# Patient Record
Sex: Female | Born: 1960 | Race: Black or African American | Hispanic: No | Marital: Single | Smoking: Current every day smoker
Health system: Southern US, Community
[De-identification: ages and names within clinical notes are randomized; demographics above are authoritative.]

## PROBLEM LIST (undated history)

## (undated) DIAGNOSIS — J449 Chronic obstructive pulmonary disease, unspecified: Secondary | ICD-10-CM

## (undated) DIAGNOSIS — J45909 Unspecified asthma, uncomplicated: Secondary | ICD-10-CM

## (undated) DIAGNOSIS — I10 Essential (primary) hypertension: Secondary | ICD-10-CM

## (undated) DIAGNOSIS — Z952 Presence of prosthetic heart valve: Secondary | ICD-10-CM

## (undated) DIAGNOSIS — I2510 Atherosclerotic heart disease of native coronary artery without angina pectoris: Secondary | ICD-10-CM

## (undated) DIAGNOSIS — I509 Heart failure, unspecified: Secondary | ICD-10-CM

## (undated) DIAGNOSIS — I429 Cardiomyopathy, unspecified: Secondary | ICD-10-CM

## (undated) DIAGNOSIS — E785 Hyperlipidemia, unspecified: Secondary | ICD-10-CM

## (undated) DIAGNOSIS — I639 Cerebral infarction, unspecified: Secondary | ICD-10-CM

## (undated) DIAGNOSIS — I219 Acute myocardial infarction, unspecified: Secondary | ICD-10-CM

## (undated) DIAGNOSIS — N189 Chronic kidney disease, unspecified: Secondary | ICD-10-CM

## (undated) DIAGNOSIS — I480 Paroxysmal atrial fibrillation: Secondary | ICD-10-CM

## (undated) HISTORY — DX: Cerebral infarction, unspecified: I63.9

## (undated) HISTORY — DX: Paroxysmal atrial fibrillation: I48.0

## (undated) HISTORY — DX: Unspecified asthma, uncomplicated: J45.909

## (undated) HISTORY — DX: Acute myocardial infarction, unspecified: I21.9

## (undated) HISTORY — DX: Chronic obstructive pulmonary disease, unspecified: J44.9

## (undated) HISTORY — DX: Chronic kidney disease, unspecified: N18.9

## (undated) HISTORY — DX: Cardiomyopathy, unspecified: I42.9

## (undated) HISTORY — DX: Hyperlipidemia, unspecified: E78.5

## (undated) HISTORY — DX: Heart failure, unspecified: I50.9

## (undated) HISTORY — PX: CORONARY ANGIOPLASTY WITH STENT PLACEMENT: SHX49

## (undated) HISTORY — DX: Presence of prosthetic heart valve: Z95.2

## (undated) HISTORY — PX: CARDIAC SURGERY: SHX584

## (undated) HISTORY — DX: Essential (primary) hypertension: I10

## (undated) HISTORY — DX: Atherosclerotic heart disease of native coronary artery without angina pectoris: I25.10

## (undated) HISTORY — PX: MITRAL VALVE SURGERY: SHX714

---

## 2012-09-11 DIAGNOSIS — Z952 Presence of prosthetic heart valve: Secondary | ICD-10-CM

## 2012-09-11 HISTORY — DX: Presence of prosthetic heart valve: Z95.2

## 2015-07-01 DIAGNOSIS — M79641 Pain in right hand: Secondary | ICD-10-CM | POA: Insufficient documentation

## 2015-07-01 DIAGNOSIS — I6930 Unspecified sequelae of cerebral infarction: Secondary | ICD-10-CM | POA: Insufficient documentation

## 2017-05-03 DIAGNOSIS — F411 Generalized anxiety disorder: Secondary | ICD-10-CM | POA: Insufficient documentation

## 2017-05-03 DIAGNOSIS — I10 Essential (primary) hypertension: Secondary | ICD-10-CM | POA: Insufficient documentation

## 2017-08-23 DIAGNOSIS — G459 Transient cerebral ischemic attack, unspecified: Secondary | ICD-10-CM | POA: Insufficient documentation

## 2017-10-16 DIAGNOSIS — Z87898 Personal history of other specified conditions: Secondary | ICD-10-CM | POA: Insufficient documentation

## 2017-10-22 DIAGNOSIS — R9430 Abnormal result of cardiovascular function study, unspecified: Secondary | ICD-10-CM | POA: Insufficient documentation

## 2017-10-22 DIAGNOSIS — I480 Paroxysmal atrial fibrillation: Secondary | ICD-10-CM | POA: Insufficient documentation

## 2018-05-14 DIAGNOSIS — F323 Major depressive disorder, single episode, severe with psychotic features: Secondary | ICD-10-CM | POA: Insufficient documentation

## 2019-02-10 DIAGNOSIS — K922 Gastrointestinal hemorrhage, unspecified: Secondary | ICD-10-CM | POA: Insufficient documentation

## 2019-07-02 ENCOUNTER — Other Ambulatory Visit: Payer: Self-pay

## 2019-07-02 ENCOUNTER — Encounter: Payer: Self-pay | Admitting: Cardiology

## 2019-07-02 ENCOUNTER — Ambulatory Visit: Payer: Medicaid Other | Admitting: Cardiology

## 2019-07-02 VITALS — BP 111/57 | HR 93 | Temp 97.7°F | Resp 16 | Ht 60.0 in | Wt 211.0 lb

## 2019-07-02 DIAGNOSIS — Z7901 Long term (current) use of anticoagulants: Secondary | ICD-10-CM

## 2019-07-02 DIAGNOSIS — Z952 Presence of prosthetic heart valve: Secondary | ICD-10-CM

## 2019-07-02 DIAGNOSIS — E782 Mixed hyperlipidemia: Secondary | ICD-10-CM

## 2019-07-02 DIAGNOSIS — Z955 Presence of coronary angioplasty implant and graft: Secondary | ICD-10-CM

## 2019-07-02 DIAGNOSIS — Z6841 Body Mass Index (BMI) 40.0 and over, adult: Secondary | ICD-10-CM

## 2019-07-02 DIAGNOSIS — Z720 Tobacco use: Secondary | ICD-10-CM

## 2019-07-02 DIAGNOSIS — I2510 Atherosclerotic heart disease of native coronary artery without angina pectoris: Secondary | ICD-10-CM

## 2019-07-02 DIAGNOSIS — J449 Chronic obstructive pulmonary disease, unspecified: Secondary | ICD-10-CM

## 2019-07-02 DIAGNOSIS — I10 Essential (primary) hypertension: Secondary | ICD-10-CM

## 2019-07-02 DIAGNOSIS — Z8679 Personal history of other diseases of the circulatory system: Secondary | ICD-10-CM

## 2019-07-02 DIAGNOSIS — Z8673 Personal history of transient ischemic attack (TIA), and cerebral infarction without residual deficits: Secondary | ICD-10-CM

## 2019-07-02 DIAGNOSIS — Z792 Long term (current) use of antibiotics: Secondary | ICD-10-CM

## 2019-07-02 LAB — POCT INR: INR: 2.5 (ref 2.0–3.0)

## 2019-07-02 MED ORDER — METOPROLOL SUCCINATE ER 25 MG PO TB24: 25.0000 mg | ORAL_TABLET | Freq: Every day | ORAL | 5 refills | Status: DC

## 2019-07-02 NOTE — Progress Notes (Signed)
Date: 07/02/2019  ID:  Joanna Martinez, DOB 05-07-1960, MRN [id number]  PCP:  Benito Mccreedy, MD  Cardiologist:  Rex Kras, DO, [facility] (established care 07/02/2019) Former Cardiology Providers: Dr. Thayer Jew [phone]  REASON FOR CONSULT: Establish care, established history of coronary disease and status post mechanical mitral valve replacement.  REQUESTING PHYSICIAN:  Benito Mccreedy, MD [address] [place],   [id number]  Chief Complaint  Patient presents with  . Coronary Artery Disease    HPI  Joanna Martinez is a 59 y.o. female who is being seen today for the evaluation of coronary artery disease at the request of Osei-Bonsu, Iona Beard, MD. Patient's past medical history and cardiac risk factors include: History of atherosclerotic coronary artery disease with prior PCI performed on October 21, 2017 with DES to LCx and [facility] Jude mechanical mitral valve replacement in March 2002 at [facility] for rheumatic mitral stenosis, on oral anticoagulation, hypertension, tobacco smoker, several small strokes, paroxysmal atrial fibrillation (outside records from [facility]), HFrEF, ischemic cardiomyopathy, postmenopausal female, advanced age, obesity.  Patient is referred to the office for establishing cardiovascular care given her extensive cardiac history after moving to [place]. She denies chest pain or anginal discomfort; however, she has been out off her cardiac medications except coumadin for the last 6 to 12 months. Patient states when she was in [place] she would have her INR checked at [facility]. She did not have any records with her during the appointment and therefore, additional records were requested from Dr. Chriss Czar office prior to the completion of this note.   Based on prior records obtained patient has history of atherosclerotic coronary artery disease with prior PCI to LCX and SJM mechanical mitral valve replacement  in 05/2000 at [facility] for rheumatic mitral stenosis on long term [place] with coumadin. Last heart cath was in November 2019 due to angina which demonstrated scattered moderate non-obstructive disease with 65% stenosis of the ostial RPDA and medical therapy was recommended. Last echo in Feb 2020 noted LVEF of 40% per report.   FUNCTIONAL STATUS: No exercise or daily routine.    ALLERGIES: Not on File  MEDICATION LIST PRIOR TO VISIT: Current Meds  Medication Sig  . acetaminophen (TYLENOL) 650 MG CR tablet Take 2 tablets by mouth every 8 (eight) hours as needed.  Marland Kitchen albuterol (VENTOLIN HFA) 108 (90 Base) MCG/ACT inhaler Inhale into the lungs daily as needed.  Marland Kitchen allopurinol (ZYLOPRIM) 100 MG tablet Take 1 tablet by mouth daily.  Marland Kitchen atorvastatin (LIPITOR) 40 MG tablet Take 1 tablet by mouth daily.  . clopidogrel (PLAVIX) 75 MG tablet Take 1 tablet by mouth daily.  Marland Kitchen doxepin (SINEQUAN) 50 MG capsule Take 1 capsule by mouth daily.  . DULoxetine (CYMBALTA) 30 MG capsule Take 1 capsule by mouth daily.  . ferrous sulfate 325 (65 FE) MG EC tablet Take 1 tablet by mouth daily.  Marland Kitchen gabapentin (NEURONTIN) 400 MG capsule Take 1 capsule by mouth daily.  . metoprolol succinate (TOPROL-XL) 25 MG 24 hr tablet Take 1 tablet (25 mg total) by mouth daily. Hold if systolic blood pressure (top blood pressure number) less than 100 mmHg or heart rate less than 60 bpm (pulse).  . mirtazapine (REMERON) 30 MG tablet Take 1 tablet by mouth daily.  Marland Kitchen oxybutynin (DITROPAN) 5 MG tablet Take 1 tablet by mouth daily.  . QUEtiapine (SEROQUEL) 300 MG tablet Take 1 tablet by mouth daily.  Marland Kitchen warfarin (COUMADIN) 3 MG tablet Take by mouth.  As  Directed; Take 4.5 mg every Tues and Thurs and 3 mg all other days. Refer to most recent anticoagulation note for most updated directions.  . [DISCONTINUED] metoprolol succinate (TOPROL-XL) 25 MG 24 hr tablet Take 1 tablet by mouth daily.  . [DISCONTINUED] simvastatin (ZOCOR) 20 MG tablet Take 1 tablet  by mouth daily.     PAST MEDICAL HISTORY: Past Medical History:  Diagnosis Date  . Asthma   . CHF (congestive heart failure) ([place])   . Chronic kidney disease   . COPD (chronic obstructive pulmonary disease) ([place])   . Coronary artery disease   . H/O heart valve replacement with mechanical valve   . Heart attack ([place])   . Hyperlipidemia   . Hypertension   . Paroxysmal atrial fibrillation (HCC)   . Stroke [facility])     PAST SURGICAL HISTORY: Past Surgical History:  Procedure Laterality Date  . CARDIAC SURGERY    . CORONARY ANGIOPLASTY WITH STENT PLACEMENT    . MITRAL VALVE SURGERY      FAMILY HISTORY: The patient family history includes Diabetes in her brother, brother, and sister; HIV/AIDS in her father and mother; Hyperlipidemia in her brother, brother, and sister; Hypertension in her brother, brother, and sister.  SOCIAL HISTORY:  The patient  reports that she has been smoking. She has been smoking about 1.00 pack per day. She has never used smokeless tobacco. She reports current alcohol use. She reports previous drug use.  REVIEW OF SYSTEMS: Review of Systems  Constitution: Negative for chills and fever.  HENT: Negative for hoarse voice and nosebleeds.   Eyes: Negative for discharge, double vision and pain.  Cardiovascular: Negative for chest pain, claudication, dyspnea on exertion, leg swelling, near-syncope, orthopnea, palpitations, paroxysmal nocturnal dyspnea and syncope.  Respiratory: Negative for hemoptysis and shortness of breath.   Musculoskeletal: Negative for muscle cramps and myalgias.  Gastrointestinal: Negative for abdominal pain, constipation, diarrhea, hematemesis, hematochezia, melena, nausea and vomiting.  Neurological: Negative for dizziness and light-headedness.    PHYSICAL EXAM: Vitals with BMI 07/02/2019  Height 5' 0"   Weight 211 lbs  BMI 71.69  Systolic 678  Diastolic 57  Pulse 93    CONSTITUTIONAL: Well-developed and well-nourished. No acute  distress.  SKIN: Skin is warm and dry. No rash noted. No cyanosis. No pallor. No jaundice HEAD: Normocephalic and atraumatic.  EYES: No scleral icterus MOUTH/THROAT: Moist oral membranes.  NECK: No JVD present. No thyromegaly noted. No carotid bruits   LYMPHATIC: No visible cervical adenopathy.  CHEST Normal respiratory effort. No intercostal retractions  LUNGS: Clear to auscultation bilaterally. No stridor. No wheezes. No rales.  CARDIOVASCULAR: Regular rate and rhythm, positive L3-Y1, mechanical click heard at the apex, no murmurs rubs or gallops appreciated. ABDOMINAL: Obese, soft, nontender, nondistended, positive bowel sounds all 4 quadrants. No apparent ascites.  EXTREMITIES: No peripheral edema  HEMATOLOGIC: No significant bruising NEUROLOGIC: Oriented to person, place, and time. Nonfocal. Normal muscle tone.  PSYCHIATRIC: Normal mood and affect. Normal behavior. Cooperative   CARDIAC DATABASE: Status post SJM mechanical mitral valve placement in March 2002 at [facility] due to rheumatic mitral stenosis.  EKG: 07/02/2019: Sinus rhythm, 94 bpm, normal axis, incomplete right bundle branch block, left atrial enlargement, nonspecific T wave abnormality.   Echocardiogram: 04/13/2019 outside records: LVEF 01-75%, grade 1 diastolic impairment, moderately global hypokinesis.  Mechanical mitral valve present.  Valve function appears normal.  No evidence of mitral valve stenosis or regurgitation.  Mitral valve pressure halftime 38 ms.  Stress Testing: We  will obtain outside records.  Heart Catheterization: November 2019 per report: Proximal LAD 20%. LCx stent patent.  Discrete 20% narrowing noted distal to the stent.  A separate 30% stenosis was noted in the distal LCx. RCA: Diffuse 20-30% narrowing in the proximal/mid/distal segments. Posterior atrioventricular branch 50 to 60% stenosis in its distal segment prior to the origin of the most distal posterolateral branch. Right PDA  smooth tubular lesions with 60 to 70% luminal narrowing.    LABORATORY DATA: No flowsheet data found.  No flowsheet data found.  Lipid Panel  No results found for: CHOL, TRIG, HDL, CHOLHDL, VLDL, LDLCALC, LDLDIRECT, LABVLDL  No results found for: HGBA1C No components found for: NTPROBNP No results found for: TSH  BMP No results for input(s): NA, K, CL, CO2, GLUCOSE, BUN, CREATININE, CALCIUM, GFRNONAA, GFRAA in the last 8760 hours.  CBC No results for input(s): WBC, RBC, HGB, HCT, PLT, MCV, MCH, MCHC, RDW, LYMPHSABS, MONOABS, EOSABS, BASOSABS in the last 168 hours.  Invalid input(s): NEUTRABS  HEMOGLOBIN A1C No results found for: HGBA1C, MPG  Cardiac Panel (last 3 results) No results for input(s): CKTOTAL, CKMB, TROPONINI, RELINDX in the last 8760 hours. No results for input(s): TROPIPOC in the last 8760 hours.  BNP (last 3 results) No results for input(s): PROBNP in the last 8760 hours.  TSH No results for input(s): TSH in the last 8760 hours.  CHOLESTEROL No results for input(s): CHOL in the last 8760 hours.  Hepatic Function Panel No results for input(s): PROT, ALBUMIN, AST, ALT, ALKPHOS, BILITOT, BILIDIR, IBILI in the last 8760 hours.   External Labs: Collected: 06/10/2019 Creatinine 1.06 mg/dL. eGFR: 66 mL/min per 1.73 m Lipid profile: Total cholesterol 304, triglycerides 310, HDL 53, LDL 190 Hemoglobin A1c: 6.3 TSH: 1.26 and T4 0.68 (free)  IMPRESSION:    ICD-10-CM   1. Atherosclerosis of native coronary artery of native heart without angina pectoris  I25.10 EKG 12-Lead    metoprolol succinate (TOPROL-XL) 25 MG 24 hr tablet  2. History of coronary angioplasty with insertion of stent  Z95.5   3. Hx of mitral valve replacement with mechanical valve  Z95.2 PCV ECHOCARDIOGRAM COMPLETE  4. Long term (current) use of anticoagulants  Z79.01   5. Need for prophylactic antibiotic  Z79.2   6. History of stroke  Z86.73   7. Tobacco use  Z72.0   8. Benign  hypertension  I10   9. Mixed hyperlipidemia  E78.2   10. Hx of rheumatic fever  Z86.79   11. Chronic obstructive pulmonary disease, unspecified COPD type ([place])  J44.9   12. Class 3 severe obesity due to excess calories with serious comorbidity and body mass index (BMI) of 40.0 to 44.9 in adult [facility])  E66.01    Z68.41      RECOMMENDATIONS: Clemmie Buelna is a 59 y.o. female whose past medical history and cardiac risk factors include: History of atherosclerotic coronary artery disease with prior PCI performed on October 21, 2017 with DES to LCx and [place] mechanical mitral valve replacement in March 2002 at [facility] for rheumatic mitral stenosis, on oral anticoagulation, hypertension, tobacco smoker, several small strokes, paroxysmal atrial fibrillation (outside records from [facility]), HFrEF, ischemic cardiomyopathy, postmenopausal female, advanced age, obesity.  Atherosclerotic coronary artery disease with prior PCI:  EKG shows normal sinus rhythm without ischemia or injury pattern.   Echocardiogram will be ordered to evaluate for structural heart disease and left ventricular systolic function.  Continue Plavix.  We will start metoprolol  25 mg p.o. daily.  Outside records obtained and reviewed.  Atrial fibrillation, paroxysmal: . Rate control: Start metoprolol . Rhythm control: N/A . Thromboembolic prophylaxis: Currently on coumadin goal INR 2.5 to 3.5.  Marland Kitchen CHA2DS2-VASc SCORE is 6 which correlates to 9.8 % risk of stroke per year. . Patient does not endorse any evidence of bleeding.  Long--term oral anticoagulation:   Indication: Paroxysmal atrial fibrillation and mechanical mitral valve.  Currently on Coumadin.  Patient does not endorse any evidence of bleeding.  INR at today's office 2.5 continue current coumadin dose.   Status post [facility] Jude mechanical mitral valve secondary to rheumatic mitral valve stenosis in  March 2002:  Echocardiogram  will be ordered to evaluate for structural heart disease and left ventricular systolic function.  Continue coumadin w/ goal INR 2.5-3.5  Active tobacco smoker:   Educated on the importance of smoking cessation. Currently using nicotine patches.   Chronic HFrEF: Will continue to uptitrate medical therapy to maximally tolerated dose of GDMT.   Recommend daily weight check, strict I/O's  Fluid restriction to <2L per day, Na restriction < 2g per day  Hyperlipidemia:  Currently managed by primary team.  Based on the last office note reviewed from her PCPs office, patient was started on Lipitor 40 mg p.o. nightly.  Patient does not endorse any myalgias.   FINAL MEDICATION LIST END OF ENCOUNTER: Meds ordered this encounter  Medications  . metoprolol succinate (TOPROL-XL) 25 MG 24 hr tablet    Sig: Take 1 tablet (25 mg total) by mouth daily. Hold if systolic blood pressure (top blood pressure number) less than 100 mmHg or heart rate less than 60 bpm (pulse).    Dispense:  30 tablet    Refill:  5    Medications Discontinued During This Encounter  Medication Reason  . simvastatin (ZOCOR) 20 MG tablet Patient Preference  . metoprolol succinate (TOPROL-XL) 25 MG 24 hr tablet Reorder  . spironolactone (ALDACTONE) 25 MG tablet Patient Preference  . isosorbide mononitrate (IMDUR) 30 MG 24 hr tablet Patient Preference     Current Outpatient Medications:  .  acetaminophen (TYLENOL) 650 MG CR tablet, Take 2 tablets by mouth every 8 (eight) hours as needed., Disp: , Rfl:  .  albuterol (VENTOLIN HFA) 108 (90 Base) MCG/ACT inhaler, Inhale into the lungs daily as needed., Disp: , Rfl:  .  allopurinol (ZYLOPRIM) 100 MG tablet, Take 1 tablet by mouth daily., Disp: , Rfl:  .  atorvastatin (LIPITOR) 40 MG tablet, Take 1 tablet by mouth daily., Disp: , Rfl:  .  clopidogrel (PLAVIX) 75 MG tablet, Take 1 tablet by mouth daily., Disp: , Rfl:  .  doxepin (SINEQUAN) 50 MG capsule, Take 1 capsule by mouth  daily., Disp: , Rfl:  .  DULoxetine (CYMBALTA) 30 MG capsule, Take 1 capsule by mouth daily., Disp: , Rfl:  .  ferrous sulfate 325 (65 FE) MG EC tablet, Take 1 tablet by mouth daily., Disp: , Rfl:  .  gabapentin (NEURONTIN) 400 MG capsule, Take 1 capsule by mouth daily., Disp: , Rfl:  .  metoprolol succinate (TOPROL-XL) 25 MG 24 hr tablet, Take 1 tablet (25 mg total) by mouth daily. Hold if systolic blood pressure (top blood pressure number) less than 100 mmHg or heart rate less than 60 bpm (pulse)., Disp: 30 tablet, Rfl: 5 .  mirtazapine (REMERON) 30 MG tablet, Take 1 tablet by mouth daily., Disp: , Rfl:  .  oxybutynin (DITROPAN) 5 MG tablet, Take 1 tablet by  mouth daily., Disp: , Rfl:  .  QUEtiapine (SEROQUEL) 300 MG tablet, Take 1 tablet by mouth daily., Disp: , Rfl:  .  warfarin (COUMADIN) 3 MG tablet, Take by mouth. As  Directed; Take 4.5 mg every Tues and Thurs and 3 mg all other days. Refer to most recent anticoagulation note for most updated directions., Disp: , Rfl:   Orders Placed This Encounter  Procedures  . EKG 12-Lead  . PCV ECHOCARDIOGRAM COMPLETE    There are no Patient Instructions on file for this visit.  Total encounter time 60 minutes. *Total Encounter Time as defined by the Centers for Medicare and Medicaid Services includes, in addition to the face-to-face time of a patient visit (documented in the note above) non-face-to-face time: obtaining and reviewing outside history, ordering and reviewing medications, tests or procedures, care coordination (communications with other health care professionals or caregivers) and documentation in the medical record. Independently reviewed the records from Dr. Chriss Czar office and Dr. Waylan Rocher office and the associated results of echo, cath report, and labs.   --Continue cardiac medications as reconciled in final medication list. --Return in about 6 weeks (around 08/13/2019) for CAD and MVR follow up; needed prior records. . Or sooner  if needed. --Continue follow-up with your primary care physician regarding the management of your other chronic comorbid conditions.  Patient's questions and concerns were addressed to her satisfaction. She voices understanding of the instructions provided during this encounter.   This note was created using a voice recognition software as a result there may be grammatical errors inadvertently enclosed that do not reflect the nature of this encounter. Every attempt is made to correct such errors.  Rex Kras, [place], [facility]  Pager: [phone] Office: [phone]

## 2019-07-09 ENCOUNTER — Ambulatory Visit: Payer: Self-pay | Admitting: Cardiology

## 2019-07-10 ENCOUNTER — Other Ambulatory Visit: Payer: Self-pay

## 2019-07-10 ENCOUNTER — Ambulatory Visit: Payer: Medicaid Other

## 2019-07-10 ENCOUNTER — Ambulatory Visit: Payer: Medicaid Other | Admitting: Pharmacist

## 2019-07-10 DIAGNOSIS — Z952 Presence of prosthetic heart valve: Secondary | ICD-10-CM

## 2019-07-10 DIAGNOSIS — Z7901 Long term (current) use of anticoagulants: Secondary | ICD-10-CM

## 2019-07-10 DIAGNOSIS — I4891 Unspecified atrial fibrillation: Secondary | ICD-10-CM

## 2019-07-10 LAB — POCT INR: INR: 4 — AB (ref 2.0–3.0)

## 2019-07-10 NOTE — Progress Notes (Addendum)
Anticoagulation Management Joanna Martinez is a 59 y.o. female who reports to the clinic for monitoring of warfarin treatment.    Indication: s/p [facility] Jude mechanical mitral valve replacement in March 2002   Duration: indefinite Supervising physician: Tessa Lerner  Anticoagulation Clinic Visit History:  Patient does not report signs/symptoms of bleeding or thromboembolism. Hx of multiple previous strokes. Most recent incidence 3 or 4 years ago. Pt also on Plavix for history of coronary artery disease status post drug-eluting stent in August 2019. Noteded major DDI b/w warfarin and plavix of increased bleeding risk. Noted DDI b/w warfarin and allopurinol about increased anticoagulant effect as well. Pt denies any recent allopurinol dose changes.Pt confirmed to have a tan colored (3 mg) warfarin tablet at home. Pt normally takes warfarin in the mornings. Requested pt to start taking warfarin in the evening in order to efficiently adjust warfarin dose during subsequent INR visits.   Other recent changes: No changes in medications or lifestyle. Pt reports to normally eating one serving of collard greens weekly, but hasn't had any this week. Also has a serving of salad once-or twice a week. Reports to also drinking 2 servings of 8 oz of cranberry juice couple days ago that is unusual for the patient. Denies any alcohol intake.  Anticoagulation Episode Summary    Current INR goal:  2.5-3.5  TTR:  --  Next INR check:  07/16/2019  INR from last check:  4.0 (07/10/2019)  Weekly max warfarin dose:  24 mg  Target end date:  Indefinite  INR check location:    Preferred lab:    Send INR reminders to:     Indications   Long term (current) use of anticoagulants [Z79.01] S/P mitral valve replacement [Z95.2]       Comments:          Not on File  Current Outpatient Medications:  .  acetaminophen (TYLENOL) 650 MG CR tablet, Take 2 tablets by mouth every 8 (eight) hours as needed., Disp: , Rfl:  .   albuterol (VENTOLIN HFA) 108 (90 Base) MCG/ACT inhaler, Inhale into the lungs daily as needed., Disp: , Rfl:  .  allopurinol (ZYLOPRIM) 100 MG tablet, Take 1 tablet by mouth daily., Disp: , Rfl:  .  atorvastatin (LIPITOR) 40 MG tablet, Take 1 tablet by mouth daily., Disp: , Rfl:  .  clopidogrel (PLAVIX) 75 MG tablet, Take 1 tablet by mouth daily., Disp: , Rfl:  .  doxepin (SINEQUAN) 50 MG capsule, Take 1 capsule by mouth daily., Disp: , Rfl:  .  DULoxetine (CYMBALTA) 30 MG capsule, Take 1 capsule by mouth daily., Disp: , Rfl:  .  ferrous sulfate 325 (65 FE) MG EC tablet, Take 1 tablet by mouth daily., Disp: , Rfl:  .  gabapentin (NEURONTIN) 400 MG capsule, Take 1 capsule by mouth daily., Disp: , Rfl:  .  metoprolol succinate (TOPROL-XL) 25 MG 24 hr tablet, Take 1 tablet (25 mg total) by mouth daily. Hold if systolic blood pressure (top blood pressure number) less than 100 mmHg or heart rate less than 60 bpm (pulse)., Disp: 30 tablet, Rfl: 5 .  mirtazapine (REMERON) 30 MG tablet, Take 1 tablet by mouth daily., Disp: , Rfl:  .  oxybutynin (DITROPAN) 5 MG tablet, Take 1 tablet by mouth daily., Disp: , Rfl:  .  QUEtiapine (SEROQUEL) 300 MG tablet, Take 1 tablet by mouth daily., Disp: , Rfl:  .  warfarin (COUMADIN) 3 MG tablet, Take by mouth. As  directed, Disp: , Rfl:  Past Medical History:  Diagnosis Date  . Asthma   . CHF (congestive heart failure) ([place])   . Chronic kidney disease   . COPD (chronic obstructive pulmonary disease) ([place])   . Coronary artery disease   . H/O heart valve replacement with mechanical valve   . Heart attack ([place])   . Hyperlipidemia   . Hypertension   . Paroxysmal atrial fibrillation (HCC)   . Stroke [facility])     ASSESSMENT  Recent Results: The most recent result is correlated with 24 mg per week:  Lab Results  Component Value Date   INR 4.0 (A) 07/10/2019   INR 2.5 07/02/2019    Anticoagulation Dosing: Description   INR above goal. Take 1.5 mg today, and  then continue taking 4.5 mg every Tues and Sat and 3 mg all other days. Recheck INR in 6 days.       INR today: Supratherapeutic likely in the setting of decreased green intake and increased cranberry juice intake. Mixed report of possible interaction between cranberry products and warfarin resulting in increase in INR. Pt reports to have been previously therapeutic on the current dose. Pt reports to already have taken her warfarin dose prior to appt. Will have pt take reduce dose tomorrow and continue with previous maintenance dose. Pt planing on returning to her normal green intake and avoiding cranberry products in the future. Will continue close monitoring.   PLAN Weekly dose was unchanged. Take 1.5 mg tomorrow and continue current dose of 4.5 mg every Tues, Sat and 3 mg all other days.   Patient Instructions  INR above goal. Take 1.5 mg today, and then continue taking 4.5 mg every Tues and Sat and 3 mg all other days. Recheck INR in 6 days.   Patient advised to contact clinic or seek medical attention if signs/symptoms of bleeding or thromboembolism occur.  Patient verbalized understanding by repeating back information and was advised to contact me if further medication-related questions arise.   Follow-up Return in about 6 days (around 07/16/2019).  [name], PharmD  15 minutes spent face-to-face with the patient during the encounter. 50% of time spent on education, including signs/sx bleeding and clotting, as well as food and drug interactions with warfarin. 50% of time was spent on fingerprick POC INR sample collection,processing, results determination, and documentation

## 2019-07-10 NOTE — Patient Instructions (Signed)
INR above goal. Take 1.5 mg today, and then continue taking 4.5 mg every Tues and Sat and 3 mg all other days. Recheck INR in 6 days.

## 2019-07-16 ENCOUNTER — Other Ambulatory Visit: Payer: Self-pay

## 2019-07-16 ENCOUNTER — Ambulatory Visit: Payer: Medicaid Other | Admitting: Pharmacist

## 2019-07-16 DIAGNOSIS — Z7901 Long term (current) use of anticoagulants: Secondary | ICD-10-CM

## 2019-07-16 DIAGNOSIS — I4891 Unspecified atrial fibrillation: Secondary | ICD-10-CM

## 2019-07-16 DIAGNOSIS — Z952 Presence of prosthetic heart valve: Secondary | ICD-10-CM

## 2019-07-16 LAB — POCT INR: INR: 2.5 (ref 2.0–3.0)

## 2019-07-16 NOTE — Progress Notes (Signed)
Anticoagulation Management Joanna Martinez is a 59 y.o. female who reports to the clinic for monitoring of warfarin treatment.    Indication: s/p Saint Jude mechanical mitral valve replacement in March 2002   Duration: indefinite Supervising physician: Rex Kras  Anticoagulation Clinic Visit History:  Patient does not report signs/symptoms of bleeding or thromboembolism. Hx of multiple previous strokes. Most recent incidence 3 or 4 years ago. Pt also on Plavix for history of coronary artery disease status post drug-eluting stent in August 2019. Noteded major DDI b/w warfarin and plavix of increased bleeding risk. Noted DDI b/w warfarin and allopurinol about increased anticoagulant effect as well. Pt denies any recent allopurinol dose changes.Pt confirmed to have a tan colored (3 mg) warfarin tablet at home. Pt started taking warfarin in the evening as previously discussed.     Other recent changes: No changes in medications or lifestyle. Pt reports to normally eating one serving of collard greens weekly. Reports to have went back to her regular green intake since last visits. States that she stopped cranberry juice intake as previously discussed.   Anticoagulation Episode Summary    Current INR goal:  2.5-3.5  TTR:  --  Next INR check:  07/23/2019  INR from last check:  2.5 (07/16/2019)  Weekly max warfarin dose:  24 mg  Target end date:  Indefinite  INR check location:    Preferred lab:    Send INR reminders to:     Indications   Long term (current) use of anticoagulants [Z79.01] S/P mitral valve replacement [Z95.2] Atrial fibrillation ([place]) [I48.91]       Comments:          Not on File  Current Outpatient Medications:  .  acetaminophen (TYLENOL) 650 MG CR tablet, Take 2 tablets by mouth every 8 (eight) hours as needed., Disp: , Rfl:  .  albuterol (VENTOLIN HFA) 108 (90 Base) MCG/ACT inhaler, Inhale into the lungs daily as needed., Disp: , Rfl:  .  allopurinol (ZYLOPRIM) 100  MG tablet, Take 1 tablet by mouth daily., Disp: , Rfl:  .  atorvastatin (LIPITOR) 40 MG tablet, Take 1 tablet by mouth daily., Disp: , Rfl:  .  clopidogrel (PLAVIX) 75 MG tablet, Take 1 tablet by mouth daily., Disp: , Rfl:  .  doxepin (SINEQUAN) 50 MG capsule, Take 1 capsule by mouth daily., Disp: , Rfl:  .  DULoxetine (CYMBALTA) 30 MG capsule, Take 1 capsule by mouth daily., Disp: , Rfl:  .  ferrous sulfate 325 (65 FE) MG EC tablet, Take 1 tablet by mouth daily., Disp: , Rfl:  .  gabapentin (NEURONTIN) 400 MG capsule, Take 1 capsule by mouth daily., Disp: , Rfl:  .  metoprolol succinate (TOPROL-XL) 25 MG 24 hr tablet, Take 1 tablet (25 mg total) by mouth daily. Hold if systolic blood pressure (top blood pressure number) less than 100 mmHg or heart rate less than 60 bpm (pulse)., Disp: 30 tablet, Rfl: 5 .  mirtazapine (REMERON) 30 MG tablet, Take 1 tablet by mouth daily., Disp: , Rfl:  .  oxybutynin (DITROPAN) 5 MG tablet, Take 1 tablet by mouth daily., Disp: , Rfl:  .  QUEtiapine (SEROQUEL) 300 MG tablet, Take 1 tablet by mouth daily., Disp: , Rfl:  .  warfarin (COUMADIN) 3 MG tablet, Take by mouth. As  directed, Disp: , Rfl:  Past Medical History:  Diagnosis Date  . Asthma   . CHF (congestive heart failure) ([place])   . Chronic kidney disease   . COPD (  chronic obstructive pulmonary disease) ([facility])   . Coronary artery disease   . H/O heart valve replacement with mechanical valve   . Heart attack ([facility])   . Hyperlipidemia   . Hypertension   . Paroxysmal atrial fibrillation ([facility])   . Stroke [facility])     ASSESSMENT  Recent Results: The most recent result is correlated with 21 mg per week:  Lab Results  Component Value Date   INR 2.5 07/16/2019   INR 4.0 (A) 07/10/2019   INR 2.5 07/02/2019    Anticoagulation Dosing: Description   INR at goal. Continue taking 4.5 mg every Tues and Thurs and 3 mg all other days. Recheck INR in 1 week.       INR today: Therapeutic. Returns to within  range following returning to her regular green intake and avoiding possible interaction between warfarin and cranberry products. Drop in INR of 1.5 over the past week. Will continue close monitoring to ensure that INR remains within range. Pt high risk for stroke.   PLAN Weekly dose was unchanged. Continue current dose of 4.5 mg every Tues, Thurs and 3 mg all other days.   Patient Instructions  INR at goal. Continue taking 4.5 mg every Tues and Thurs and 3 mg all other days. Recheck INR in 1 week.   Patient advised to contact clinic or seek medical attention if signs/symptoms of bleeding or thromboembolism occur.  Patient verbalized understanding by repeating back information and was advised to contact me if further medication-related questions arise.   Follow-up Return in about 1 week (around 07/23/2019).  Leonides Schanz, PharmD  15 minutes spent face-to-face with the patient during the encounter. 50% of time spent on education, including signs/sx bleeding and clotting, as well as food and drug interactions with warfarin. 50% of time was spent on fingerprick POC INR sample collection,processing, results determination, and documentation

## 2019-07-16 NOTE — Patient Instructions (Signed)
INR at goal. Continue taking 4.5 mg every Tues and Thurs and 3 mg all other days. Recheck INR in 1 week.

## 2019-07-23 ENCOUNTER — Ambulatory Visit: Payer: Medicaid Other

## 2019-07-23 ENCOUNTER — Encounter: Payer: Self-pay | Admitting: Cardiology

## 2019-07-30 ENCOUNTER — Other Ambulatory Visit: Payer: Self-pay

## 2019-07-30 ENCOUNTER — Ambulatory Visit: Payer: Medicaid Other | Admitting: Pharmacist

## 2019-07-30 DIAGNOSIS — Z952 Presence of prosthetic heart valve: Secondary | ICD-10-CM

## 2019-07-30 DIAGNOSIS — I4891 Unspecified atrial fibrillation: Secondary | ICD-10-CM

## 2019-07-30 DIAGNOSIS — Z5181 Encounter for therapeutic drug level monitoring: Secondary | ICD-10-CM

## 2019-07-30 DIAGNOSIS — Z7901 Long term (current) use of anticoagulants: Secondary | ICD-10-CM

## 2019-07-30 LAB — POCT INR: INR: 2.6 (ref 2.0–3.0)

## 2019-07-30 NOTE — Progress Notes (Signed)
Anticoagulation Management Joanna Martinez is a 59 y.o. female who reports to the clinic for monitoring of warfarin treatment.    Indication: s/p Saint Jude mechanical mitral valve replacement in March 2002   Duration: indefinite Supervising physician: Rex Kras  Anticoagulation Clinic Visit History:  Patient does not report signs/symptoms of bleeding or thromboembolism. Hx of multiple previous strokes. Most recent incidence 3 or 4 years ago. Pt also on Plavix for history of coronary artery disease status post drug-eluting stent in August 2019. Noteded major DDI b/w warfarin and plavix of increased bleeding risk. Noted DDI b/w warfarin and allopurinol about increased anticoagulant effect as well. Pt denies any recent allopurinol dose changes.Pt confirmed to have a tan colored (3 mg) warfarin tablet at home. Pt started taking warfarin in the evening as previously discussed.     Other recent changes: No changes in medications or lifestyle.   Anticoagulation Episode Summary    Current INR goal:  2.5-3.5  TTR:  100.0 % (1.4 wk)  Next INR check:  08/13/2019  INR from last check:  2.6 (07/30/2019)  Weekly max warfarin dose:  24 mg  Target end date:  Indefinite  INR check location:    Preferred lab:    Send INR reminders to:     Indications   Long term (current) use of anticoagulants [Z79.01] S/P mitral valve replacement [Z95.2] Atrial fibrillation ([place]) [I48.91]       Comments:          Not on File  Current Outpatient Medications:  .  acetaminophen (TYLENOL) 650 MG CR tablet, Take 2 tablets by mouth every 8 (eight) hours as needed., Disp: , Rfl:  .  albuterol (VENTOLIN HFA) 108 (90 Base) MCG/ACT inhaler, Inhale into the lungs daily as needed., Disp: , Rfl:  .  allopurinol (ZYLOPRIM) 100 MG tablet, Take 1 tablet by mouth daily., Disp: , Rfl:  .  atorvastatin (LIPITOR) 40 MG tablet, Take 1 tablet by mouth daily., Disp: , Rfl:  .  clopidogrel (PLAVIX) 75 MG tablet, Take 1 tablet by  mouth daily., Disp: , Rfl:  .  doxepin (SINEQUAN) 50 MG capsule, Take 1 capsule by mouth daily., Disp: , Rfl:  .  DULoxetine (CYMBALTA) 30 MG capsule, Take 1 capsule by mouth daily., Disp: , Rfl:  .  ferrous sulfate 325 (65 FE) MG EC tablet, Take 1 tablet by mouth daily., Disp: , Rfl:  .  gabapentin (NEURONTIN) 400 MG capsule, Take 1 capsule by mouth daily., Disp: , Rfl:  .  metoprolol succinate (TOPROL-XL) 25 MG 24 hr tablet, Take 1 tablet (25 mg total) by mouth daily. Hold if systolic blood pressure (top blood pressure number) less than 100 mmHg or heart rate less than 60 bpm (pulse)., Disp: 30 tablet, Rfl: 5 .  mirtazapine (REMERON) 30 MG tablet, Take 1 tablet by mouth daily., Disp: , Rfl:  .  oxybutynin (DITROPAN) 5 MG tablet, Take 1 tablet by mouth daily., Disp: , Rfl:  .  QUEtiapine (SEROQUEL) 300 MG tablet, Take 1 tablet by mouth daily., Disp: , Rfl:  .  warfarin (COUMADIN) 3 MG tablet, Take by mouth. As  Directed; Take 4.5 mg every Tues and Thurs and 3 mg all other days. Refer to most recent anticoagulation note for most updated directions., Disp: , Rfl:  Past Medical History:  Diagnosis Date  . Asthma   . Cardiomyopathy ([place])   . CHF (congestive heart failure) ([place])   . Chronic kidney disease   . COPD (chronic obstructive pulmonary  disease) ([facility])   . Coronary artery disease   . H/O heart valve replacement with mechanical valve   . Heart attack ([facility])   . Hyperlipidemia   . Hypertension   . Paroxysmal atrial fibrillation ([facility])   . Stroke [facility])     ASSESSMENT  Recent Results: The most recent result is correlated with 24 mg per week:  Lab Results  Component Value Date   INR 2.6 07/30/2019   INR 2.5 07/16/2019   INR 4.0 (A) 07/10/2019    Anticoagulation Dosing: Description   INR at goal. Continue taking 4.5 mg every Tues and Thurs and 3 mg all other days. Recheck INR in 2 week w/ OV      INR today: Therapeutic. INR continues to remain therapeutic on current dose.  Denies any changes in medication, diet, or lifestyle since last check. Denies any bleeding or bruising symptoms that are unusual for her. Pt high risk for stroke. Will continue close monitoring. Pt has a f/u OV w/ Dr. Odis Hollingshead on 6/10.   PLAN Weekly dose was unchanged. Continue current dose of 4.5 mg every Tues, Thurs and 3 mg all other days. Recheck INR in 2 weeks w/ OV  Patient Instructions  INR at goal. Continue taking 4.5 mg every Tues and Thurs and 3 mg all other days. Recheck INR in 2 week w/ OV  Patient advised to contact clinic or seek medical attention if signs/symptoms of bleeding or thromboembolism occur.  Patient verbalized understanding by repeating back information and was advised to contact me if further medication-related questions arise.   Follow-up Return in about 2 weeks (around 08/13/2019).  Leonides Schanz, PharmD  15 minutes spent face-to-face with the patient during the encounter. 50% of time spent on education, including signs/sx bleeding and clotting, as well as food and drug interactions with warfarin. 50% of time was spent on fingerprick POC INR sample collection,processing, results determination, and documentation

## 2019-07-30 NOTE — Patient Instructions (Signed)
INR at goal. Continue taking 4.5 mg every Tues and Thurs and 3 mg all other days. Recheck INR in 2 week w/ OV

## 2019-08-04 ENCOUNTER — Telehealth: Payer: Self-pay

## 2019-08-04 NOTE — Telephone Encounter (Signed)
-----   Message from [place], [place] sent at 08/02/2019 12:36 PM EDT ----- Please make sure the patient keep her upcoming follow-up appointment to review her echocardiogram results.

## 2019-08-04 NOTE — Telephone Encounter (Signed)
Spoke with patient, patient voiced understanding.

## 2019-08-12 NOTE — Progress Notes (Signed)
Joanna Martinez Date of Birth: 05-04-1960 MRN: [id number] Primary Care Provider:Osei-Bonsu, Iona Beard, MD Former Cardiology Providers: Dr. Thayer Jew [phone] Primary Cardiologist: Rex Kras, DO, [facility] (established care 07/02/2019)   Date: 08/13/19 Last Office Visit: 07/02/2019  Chief Complaint  Patient presents with  . Coronary Artery Disease  . Results    Echocardiogram  . INR check  . Follow-up    6 week    HPI  Joanna Martinez is a 59 y.o. 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   Patient was originally referred to the office to establish care given her history of coronary artery disease and status post mechanical mitral valve replacement at the request of her primary care physician.  At last office visit patient was asymptomatic from a cardiovascular standpoint.  However, she noted that she was out of cardiac medications for approximately 6 to 12 months.  But was taking her Coumadin on a regular basis.  Prior to establishing care with our practice patient was to get an INR checked at  [facility].  Her last INR was checked approximately 3 weeks ago and it was 2.6.  Today's INR was 3.0.   Based on prior records obtained patient has history of atherosclerotic coronary artery disease with prior PCI to LCX and SJM mechanical mitral valve replacement in 05/2000 at [facility] for rheumatic mitral stenosis on  long term [place] with coumadin. Last heart cath was in November 2019 due to angina which demonstrated scattered moderate non-obstructive disease with 65% stenosis of the ostial RPDA and medical therapy was recommended.  At the last office visit, she was recommended to get an echocardiogram given her extensive cardiovascular history.  Echocardiogram performed in May 2021 noted moderately reduced left-ventricular systolic function at 30 to 35%, moderately dilated left atrium, functional mitral mechanical valve prosthesis.  The results were reviewed with the patient in great detail at today's visit and noted below for further reference.   Since last office visit her symptoms are quite stable.  She has not been seen at urgent care or ER for cardiovascular symptoms.  Patient is compliant with her medical therapy.  She has gained 2 pounds since last office visit.  And currently not in overt heart failure.  She was prescribed metoprolol at the last visit but has not started taking it.  FUNCTIONAL STATUS: No exercise or daily routine.    ALLERGIES: No Known Allergies  MEDICATION LIST PRIOR TO VISIT: Current Meds  Medication Sig  . acetaminophen (TYLENOL) 650 MG CR tablet Take 2 tablets by mouth every 8 (eight) hours as needed.  Marland Kitchen albuterol (VENTOLIN HFA) 108 (90 Base) MCG/ACT inhaler Inhale into the lungs daily as needed.  Marland Kitchen allopurinol (ZYLOPRIM) 100 MG tablet Take 1 tablet by mouth daily.  Marland Kitchen atorvastatin (LIPITOR) 40 MG tablet Take 1 tablet by mouth daily.  . clopidogrel (PLAVIX) 75 MG tablet Take 1 tablet by mouth daily.  Marland Kitchen doxepin (SINEQUAN) 50 MG capsule Take 1 capsule by mouth daily.  . DULoxetine (CYMBALTA) 30 MG capsule Take  1 capsule by mouth daily.  . ferrous sulfate 325 (65 FE) MG EC tablet Take 1 tablet by mouth daily.  Marland Kitchen gabapentin (NEURONTIN) 400 MG capsule Take 1 capsule by mouth daily.  . metoprolol succinate (TOPROL-XL) 25 MG 24 hr tablet Take 1 tablet (25 mg total) by mouth daily. Hold  if systolic blood pressure (top blood pressure number) less than 100 mmHg or heart rate less than 60 bpm (pulse).  . mirtazapine (REMERON) 30 MG tablet Take 1 tablet by mouth daily.  Marland Kitchen oxybutynin (DITROPAN) 5 MG tablet Take 1 tablet by mouth daily.  . QUEtiapine (SEROQUEL) 300 MG tablet Take 1 tablet by mouth daily.  Marland Kitchen warfarin (COUMADIN) 3 MG tablet Take by mouth. As  Directed; Take 4.5 mg every Tues and Thurs and 3 mg all other days. Refer to most recent anticoagulation note for most updated directions.     PAST MEDICAL HISTORY: Past Medical History:  Diagnosis Date  . Asthma   . Cardiomyopathy ([place])   . CHF (congestive heart failure) ([place])   . Chronic kidney disease   . COPD (chronic obstructive pulmonary disease) ([place])   . Coronary artery disease   . H/O heart valve replacement with mechanical valve   . Heart attack ([place])   . Hyperlipidemia   . Hypertension   . Paroxysmal atrial fibrillation (HCC)   . Stroke [facility])     PAST SURGICAL HISTORY: Past Surgical History:  Procedure Laterality Date  . CARDIAC SURGERY    . CORONARY ANGIOPLASTY WITH STENT PLACEMENT    . MITRAL VALVE SURGERY      FAMILY HISTORY: The patient family history includes Diabetes in her brother, brother, and sister; HIV/AIDS in her father and mother; Hyperlipidemia in her brother, brother, and sister; Hypertension in her brother, brother, and sister.  SOCIAL HISTORY:  The patient  reports that she has been smoking. She has been smoking about 1.00 pack per day. She has never used smokeless tobacco. She reports previous alcohol use. She reports that she does not use drugs.  REVIEW OF SYSTEMS: Review of Systems  Constitution: Negative for chills and fever.  HENT: Negative for hoarse voice and nosebleeds.   Eyes: Negative for discharge, double vision and pain.  Cardiovascular: Negative for chest pain, claudication, dyspnea on exertion, leg swelling, near-syncope, orthopnea, palpitations, paroxysmal nocturnal  dyspnea and syncope.  Respiratory: Negative for hemoptysis and shortness of breath.   Musculoskeletal: Negative for muscle cramps and myalgias.  Gastrointestinal: Negative for abdominal pain, constipation, diarrhea, hematemesis, hematochezia, melena, nausea and vomiting.  Neurological: Negative for dizziness and light-headedness.    PHYSICAL EXAM: Vitals with BMI 08/13/2019 07/02/2019  Height 5' 0"  5' 0"   Weight 213 lbs 6 oz 211 lbs  BMI 16.10 96.04  Systolic 540 981  Diastolic 69 57  Pulse 97 93    CONSTITUTIONAL: Well-developed and well-nourished. No acute distress.  SKIN: Skin is warm and dry. No rash noted. No cyanosis. No pallor. No jaundice HEAD: Normocephalic and atraumatic.  EYES: No scleral icterus MOUTH/THROAT: Moist oral membranes.  NECK: No JVD present. No thyromegaly noted. No carotid bruits   LYMPHATIC: No visible cervical adenopathy.  CHEST Normal respiratory effort. No intercostal retractions  LUNGS: Clear to auscultation bilaterally. No stridor. No wheezes. No rales.  CARDIOVASCULAR: Regular rate and rhythm, positive X9-J4, mechanical click heard at the apex, no murmurs rubs or gallops appreciated. ABDOMINAL: Obese, soft, nontender, nondistended, positive bowel sounds all 4 quadrants. No apparent ascites.  EXTREMITIES: No peripheral edema  HEMATOLOGIC:  No significant bruising NEUROLOGIC: Oriented to person, place, and time. Nonfocal. Normal muscle tone.  PSYCHIATRIC: Normal mood and affect. Normal behavior. Cooperative   CARDIAC DATABASE: Status post SJM mechanical mitral valve placement in March 2002 at [facility] due to rheumatic mitral stenosis.  EKG: 07/02/2019: Sinus rhythm, 94 bpm, normal axis, incomplete right bundle branch block, left atrial enlargement, nonspecific T wave abnormality.   Echocardiogram: 04/13/2019 outside records: LVEF 36-14%, grade 1 diastolic impairment, moderately global hypokinesis.  Mechanical mitral valve present.  Valve  function appears normal.  No evidence of mitral valve stenosis or regurgitation.  Mitral valve pressure halftime 38 ms.  07/10/2019: Moderately depressed LV systolic function with visual EF 30-35%. Left ventricle cavity is normal in size. Normal global wall motion. Indeterminate diastolic filling pattern. Calculated EF 35%. Left atrial cavity is moderately dilated at 4.5 cm. Mechanical mitral valve.  Trace mitral regurgitation, withing physiologic limits.  Normal mitral valve leaflet mobility. Peak PR of 4.6 and mean PG of 4 mm Hg. Trace tricuspid regurgitation. No evidence of pulmonary hypertension.   Stress Testing: We will obtain outside records.  Heart Catheterization: November 2019 per report: Proximal LAD 20%. LCx stent patent.  Discrete 20% narrowing noted distal to the stent.  A separate 30% stenosis was noted in the distal LCx. RCA: Diffuse 20-30% narrowing in the proximal/mid/distal segments. Posterior atrioventricular branch 50 to 60% stenosis in its distal segment prior to the origin of the most distal posterolateral branch. Right PDA smooth tubular lesions with 60 to 70% luminal narrowing.    LABORATORY DATA: No flowsheet data found.  No flowsheet data found.  Lipid Panel  No results found for: CHOL, TRIG, HDL, CHOLHDL, VLDL, LDLCALC, LDLDIRECT, LABVLDL  No results found for: HGBA1C No components found for: NTPROBNP No results found for: TSH  BMP No results for input(s): NA, K, CL, CO2, GLUCOSE, BUN, CREATININE, CALCIUM, GFRNONAA, GFRAA in the last 8760 hours.  CBC No results for input(s): WBC, RBC, HGB, HCT, PLT, MCV, MCH, MCHC, RDW, LYMPHSABS, MONOABS, EOSABS, BASOSABS in the last 168 hours.  Invalid input(s): NEUTRABS  HEMOGLOBIN A1C No results found for: HGBA1C, MPG  Cardiac Panel (last 3 results) No results for input(s): CKTOTAL, CKMB, TROPONINI, RELINDX in the last 8760 hours. No results for input(s): TROPIPOC in the last 8760 hours.  BNP (last 3  results) No results for input(s): PROBNP in the last 8760 hours.  TSH No results for input(s): TSH in the last 8760 hours.  CHOLESTEROL No results for input(s): CHOL in the last 8760 hours.  Hepatic Function Panel No results for input(s): PROT, ALBUMIN, AST, ALT, ALKPHOS, BILITOT, BILIDIR, IBILI in the last 8760 hours.   External Labs: Collected: 06/10/2019 Creatinine 1.06 mg/dL. eGFR: 66 mL/min per 1.73 m Lipid profile: Total cholesterol 304, triglycerides 310, HDL 53, LDL 190 Hemoglobin A1c: 6.3 TSH: 1.26 and T4 0.68 (free)  IMPRESSION:    ICD-10-CM   1. Chronic HFrEF (heart failure with reduced ejection fraction) (HCC)  I50.22 sacubitril-valsartan (ENTRESTO) 24-26 MG    Basic metabolic panel    Magnesium    Pro b natriuretic peptide (BNP)[facility] [facility])  2. Atherosclerosis of native coronary artery of native heart without angina pectoris  I25.10 POCT INR  3. History of coronary angioplasty with insertion of stent  Z95.5   4. Ischemic cardiomyopathy  I25.5   5. Atrial fibrillation, unspecified type ([place])  I48.91   6. Long term (current) use of anticoagulants  Z79.01   7. Hx of mitral  valve replacement with mechanical valve  Z95.2   8. Hx of rheumatic fever  Z86.79   9. Need for prophylactic antibiotic  Z79.2   10. History of stroke  Z86.73   11. Benign hypertension  I10   12. Mixed hyperlipidemia  E78.2   13. Tobacco use  Z72.0   14. Chronic obstructive pulmonary disease, unspecified COPD type ([place])  J44.9      RECOMMENDATIONS: Laporsha Grealish is a 59 y.o. 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  Atherosclerotic coronary artery disease with prior PCI without angina:  Echocardiogram results reviewed with the patient at today's visit.  In comparing the outside records there is a change in LVEF compared to February 2021.  This could be secondary to the fact that the patient ran out of her medications for 6-12 months prior to establishing care with myself.  Given the fact that she is asymptomatic would like to uptitrate her guideline directed medical therapy and to reevaluate her symptoms and EF.  Patient is agreeable with the plan of care.  Continue Plavix.  Start metoprolol 25 mg p.o. daily.  Chronic HFrEF: Will continue to uptitrate medical therapy to maximally tolerated dose of GDMT.   Start Entresto 24/26 mg p.o. twice daily.  Continue Toprol-XL.  Continue Plavix.  Continue statin therapy.  Check BMP, magnesium, and NT proBNP in 1 week after initiating Entresto.  Recommend daily weight check, strict I/O's  Fluid restriction to <2L per day, Na restriction < 2g per day  She will have repeat labs done around August 21, 2019 and I would like to see her in follow-up on June 25th 2021 to uptitrate heart failure medications.   Ischemic cardiomyopathy: See above  Atrial fibrillation, paroxysmal: . Rate control: Start metoprolol . Rhythm control: N/A . Thromboembolic prophylaxis: Currently on coumadin goal INR 2.5 to 3.5.  Marland Kitchen CHA2DS2-VASc SCORE is 6 which correlates to 9.8 % risk of stroke per year. . Patient does not endorse any evidence of bleeding.  Long--term oral anticoagulation:   Indication: Paroxysmal atrial fibrillation and mechanical mitral valve.  Currently on Coumadin.  Patient does not endorse any evidence of bleeding.  INR at today's office 3.0 continue current coumadin dose.   Continue with her current Coumadin dosing.  She  takes 4.5 mg Tuesdays and Thursdays and 3 mg all other days.  Status post [facility] Jude mechanical mitral valve secondary to rheumatic mitral valve stenosis in  March 2002:  Continue coumadin w/ goal INR 2.5-3.5  Active tobacco smoker:   Educated on the importance of smoking cessation. Currently using nicotine patches.   Hyperlipidemia:  Currently managed by primary team.  Continue Lipitor 40 mg p.o. nightly.  Patient does not endorse any myalgias.   FINAL MEDICATION LIST END OF ENCOUNTER: Meds ordered this encounter  Medications  . sacubitril-valsartan (ENTRESTO) 24-26 MG    Sig: Take 1 tablet by mouth 2 (two) times daily.    Dispense:  60 tablet    Refill:  0    There are no discontinued medications.   Current Outpatient Medications:  .  acetaminophen (TYLENOL) 650 MG CR tablet,  Take 2 tablets by mouth every 8 (eight) hours as needed., Disp: , Rfl:  .  albuterol (VENTOLIN HFA) 108 (90 Base) MCG/ACT inhaler, Inhale into the lungs daily as needed., Disp: , Rfl:  .  allopurinol (ZYLOPRIM) 100 MG tablet, Take 1 tablet by mouth daily., Disp: , Rfl:  .  atorvastatin (LIPITOR) 40 MG tablet, Take 1 tablet by mouth daily., Disp: , Rfl:  .  clopidogrel (PLAVIX) 75 MG tablet, Take 1 tablet by mouth daily., Disp: , Rfl:  .  doxepin (SINEQUAN) 50 MG capsule, Take 1 capsule by mouth daily., Disp: , Rfl:  .  DULoxetine (CYMBALTA) 30 MG capsule, Take 1 capsule by mouth daily., Disp: , Rfl:  .  ferrous sulfate 325 (65 FE) MG EC tablet, Take 1 tablet by mouth daily., Disp: , Rfl:  .  gabapentin (NEURONTIN) 400 MG capsule, Take 1 capsule by mouth daily., Disp: , Rfl:  .  metoprolol succinate (TOPROL-XL) 25 MG 24 hr tablet, Take 1 tablet (25 mg total) by mouth daily. Hold if systolic blood pressure (top blood pressure number) less than 100 mmHg or heart rate less than 60 bpm (pulse)., Disp: 30 tablet, Rfl: 5 .  mirtazapine (REMERON) 30 MG tablet, Take 1 tablet by mouth daily., Disp: , Rfl:  .   oxybutynin (DITROPAN) 5 MG tablet, Take 1 tablet by mouth daily., Disp: , Rfl:  .  QUEtiapine (SEROQUEL) 300 MG tablet, Take 1 tablet by mouth daily., Disp: , Rfl:  .  warfarin (COUMADIN) 3 MG tablet, Take by mouth. As  Directed; Take 4.5 mg every Tues and Thurs and 3 mg all other days. Refer to most recent anticoagulation note for most updated directions., Disp: , Rfl:  .  sacubitril-valsartan (ENTRESTO) 24-26 MG, Take 1 tablet by mouth 2 (two) times daily., Disp: 60 tablet, Rfl: 0  Orders Placed This Encounter  Procedures  . Basic metabolic panel  . Magnesium  . Pro b natriuretic peptide (BNP)[facility] [facility])  . POCT INR    There are no Patient Instructions on file for this visit.   --Continue cardiac medications as reconciled in final medication list. --Return in about 2 weeks (around 08/27/2019) for heart failure management.. Or sooner if needed. --Continue follow-up with your primary care physician regarding the management of your other chronic comorbid conditions.  Patient's questions and concerns were addressed to her satisfaction. She voices understanding of the instructions provided during this encounter.   This note was created using a voice recognition software as a result there may be grammatical errors inadvertently enclosed that do not reflect the nature of this encounter. Every attempt is made to correct such errors.  Rex Kras, [place], [facility]  Pager: [phone] Office: [phone]

## 2019-08-13 ENCOUNTER — Ambulatory Visit: Payer: Medicaid Other | Admitting: Cardiology

## 2019-08-13 ENCOUNTER — Encounter: Payer: Self-pay | Admitting: Cardiology

## 2019-08-13 ENCOUNTER — Telehealth: Payer: Self-pay

## 2019-08-13 ENCOUNTER — Other Ambulatory Visit: Payer: Self-pay

## 2019-08-13 VITALS — BP 114/69 | HR 97 | Resp 16 | Ht 60.0 in | Wt 213.4 lb

## 2019-08-13 DIAGNOSIS — Z8673 Personal history of transient ischemic attack (TIA), and cerebral infarction without residual deficits: Secondary | ICD-10-CM

## 2019-08-13 DIAGNOSIS — Z8679 Personal history of other diseases of the circulatory system: Secondary | ICD-10-CM

## 2019-08-13 DIAGNOSIS — Z952 Presence of prosthetic heart valve: Secondary | ICD-10-CM

## 2019-08-13 DIAGNOSIS — J449 Chronic obstructive pulmonary disease, unspecified: Secondary | ICD-10-CM

## 2019-08-13 DIAGNOSIS — I255 Ischemic cardiomyopathy: Secondary | ICD-10-CM

## 2019-08-13 DIAGNOSIS — I5022 Chronic systolic (congestive) heart failure: Secondary | ICD-10-CM

## 2019-08-13 DIAGNOSIS — I10 Essential (primary) hypertension: Secondary | ICD-10-CM

## 2019-08-13 DIAGNOSIS — I2510 Atherosclerotic heart disease of native coronary artery without angina pectoris: Secondary | ICD-10-CM

## 2019-08-13 DIAGNOSIS — E782 Mixed hyperlipidemia: Secondary | ICD-10-CM

## 2019-08-13 DIAGNOSIS — Z7901 Long term (current) use of anticoagulants: Secondary | ICD-10-CM

## 2019-08-13 DIAGNOSIS — I4891 Unspecified atrial fibrillation: Secondary | ICD-10-CM

## 2019-08-13 DIAGNOSIS — Z955 Presence of coronary angioplasty implant and graft: Secondary | ICD-10-CM

## 2019-08-13 DIAGNOSIS — Z792 Long term (current) use of antibiotics: Secondary | ICD-10-CM

## 2019-08-13 DIAGNOSIS — Z720 Tobacco use: Secondary | ICD-10-CM

## 2019-08-13 LAB — POCT INR: INR: 3 (ref 2.0–3.0)

## 2019-08-13 MED ORDER — ENTRESTO 24-26 MG PO TABS: 1.0000 | ORAL_TABLET | Freq: Two times a day (BID) | ORAL | 0 refills | Status: DC

## 2019-08-13 NOTE — Telephone Encounter (Signed)
Patient did not bring in any medications. Confirmed all medications verbally.

## 2019-08-17 ENCOUNTER — Telehealth: Payer: Self-pay

## 2019-08-17 NOTE — Telephone Encounter (Signed)
Patient called to let me know that she has not started the entresto because it needs a prior auth. She will start once med is approved. Sample was offered to her but she declined

## 2019-08-18 NOTE — Telephone Encounter (Signed)
Could we work on prior [place]?

## 2019-08-19 ENCOUNTER — Other Ambulatory Visit: Payer: Self-pay

## 2019-08-19 DIAGNOSIS — I5022 Chronic systolic (congestive) heart failure: Secondary | ICD-10-CM

## 2019-08-25 LAB — BASIC METABOLIC PANEL
BUN/Creatinine Ratio: 14 (ref 12–28)
BUN: 13 mg/dL (ref 8–27)
CO2: 18 mmol/L — ABNORMAL LOW (ref 20–29)
Calcium: 9.3 mg/dL (ref 8.7–10.3)
Chloride: 108 mmol/L — ABNORMAL HIGH (ref 96–106)
Creatinine, Ser: 0.96 mg/dL (ref 0.57–1.00)
GFR calc Af Amer: 74 mL/min/{1.73_m2} (ref >59)
GFR calc non Af Amer: 64 mL/min/{1.73_m2} (ref >59)
Glucose: 130 mg/dL — ABNORMAL HIGH (ref 65–99)
Potassium: 4.7 mmol/L (ref 3.5–5.2)
Sodium: 138 mmol/L (ref 134–144)

## 2019-08-25 LAB — PRO B NATRIURETIC PEPTIDE: NT-Pro BNP: 109 pg/mL (ref 0–287)

## 2019-08-25 LAB — MAGNESIUM: Magnesium: 1.8 mg/dL (ref 1.6–2.3)

## 2019-08-28 ENCOUNTER — Ambulatory Visit: Payer: Medicaid Other | Admitting: Pharmacist

## 2019-08-28 ENCOUNTER — Other Ambulatory Visit: Payer: Self-pay

## 2019-08-28 DIAGNOSIS — Z952 Presence of prosthetic heart valve: Secondary | ICD-10-CM

## 2019-08-28 DIAGNOSIS — Z5181 Encounter for therapeutic drug level monitoring: Secondary | ICD-10-CM

## 2019-08-28 DIAGNOSIS — I4891 Unspecified atrial fibrillation: Secondary | ICD-10-CM

## 2019-08-28 DIAGNOSIS — Z7901 Long term (current) use of anticoagulants: Secondary | ICD-10-CM

## 2019-08-28 LAB — POCT INR: INR: 3.8 — AB (ref 2.0–3.0)

## 2019-08-28 NOTE — Progress Notes (Signed)
Collad greens x1  Salad 2 servings TIW   Anticoagulation Management Joanna Martinez is a 59 y.o. female who reports to the clinic for monitoring of warfarin treatment.    Indication: s/p [place] mechanical mitral valve replacement in March 2002   Duration: indefinite Supervising physician: Tessa Lerner  Anticoagulation Clinic Visit History:  Patient does not report signs/symptoms of bleeding or thromboembolism. Hx of multiple previous strokes. Most recent incidence 3 or 4 years ago. Pt also on Plavix for history of coronary artery disease status post drug-eluting stent in August 2019. Noteded major DDI b/w warfarin and plavix of increased bleeding risk. Noted DDI b/w warfarin and allopurinol about increased anticoagulant effect as well. Pt denies any recent allopurinol dose changes.Pt confirmed to have a tan colored (3 mg) warfarin tablet at home. Pt started taking warfarin in the evening as previously discussed.     Other recent changes: No changes in medications or lifestyle. Pt normally has 1 serving of collard greens/week and 2 serving of salad TIW. Pt stated that she hadnt had her serving of collard greens this week. Planing on returning to normal VitK intake.     Anticoagulation Episode Summary    Current INR goal:  2.5-3.5  TTR:  85.7 % (1.3 mo)  Next INR check:  09/16/2019  INR from last check:  3.8 (08/28/2019)  Weekly max warfarin dose:  24 mg  Target end date:  Indefinite  INR check location:    Preferred lab:    Send INR reminders to:     Indications   S/P mitral valve replacement [Z95.2] Atrial fibrillation (HCC) [I48.91] Monitoring for long-term anticoagulant use [Z51.81 Z79.01]       Comments:          No Known Allergies  Current Outpatient Medications:  .  acetaminophen (TYLENOL) 650 MG CR tablet, Take 2 tablets by mouth every 8 (eight) hours as needed., Disp: , Rfl:  .  albuterol (VENTOLIN HFA) 108 (90 Base) MCG/ACT inhaler, Inhale into the lungs daily as  needed., Disp: , Rfl:  .  allopurinol (ZYLOPRIM) 100 MG tablet, Take 1 tablet by mouth daily., Disp: , Rfl:  .  atorvastatin (LIPITOR) 40 MG tablet, Take 1 tablet by mouth daily., Disp: , Rfl:  .  clopidogrel (PLAVIX) 75 MG tablet, Take 1 tablet by mouth daily., Disp: , Rfl:  .  doxepin (SINEQUAN) 50 MG capsule, Take 1 capsule by mouth daily., Disp: , Rfl:  .  DULoxetine (CYMBALTA) 30 MG capsule, Take 1 capsule by mouth daily., Disp: , Rfl:  .  ferrous sulfate 325 (65 FE) MG EC tablet, Take 1 tablet by mouth daily., Disp: , Rfl:  .  gabapentin (NEURONTIN) 400 MG capsule, Take 1 capsule by mouth daily., Disp: , Rfl:  .  metoprolol succinate (TOPROL-XL) 25 MG 24 hr tablet, Take 1 tablet (25 mg total) by mouth daily. Hold if systolic blood pressure (top blood pressure number) less than 100 mmHg or heart rate less than 60 bpm (pulse)., Disp: 30 tablet, Rfl: 5 .  mirtazapine (REMERON) 30 MG tablet, Take 1 tablet by mouth daily., Disp: , Rfl:  .  oxybutynin (DITROPAN) 5 MG tablet, Take 1 tablet by mouth daily., Disp: , Rfl:  .  QUEtiapine (SEROQUEL) 300 MG tablet, Take 1 tablet by mouth daily., Disp: , Rfl:  .  sacubitril-valsartan (ENTRESTO) 24-26 MG, Take 1 tablet by mouth 2 (two) times daily., Disp: 60 tablet, Rfl: 0 .  warfarin (COUMADIN) 3 MG tablet, Take by  mouth. As  Directed; Take 4.5 mg every Tues and Thurs and 3 mg all other days. Refer to most recent anticoagulation note for most updated directions., Disp: , Rfl:  Past Medical History:  Diagnosis Date  . Asthma   . Cardiomyopathy ([place])   . CHF (congestive heart failure) ([place])   . Chronic kidney disease   . COPD (chronic obstructive pulmonary disease) ([place])   . Coronary artery disease   . H/O heart valve replacement with mechanical valve   . Heart attack ([place])   . Hyperlipidemia   . Hypertension   . Paroxysmal atrial fibrillation (HCC)   . Stroke [facility])     ASSESSMENT  Recent Results: The most recent result is correlated with 24  mg per week:  Lab Results  Component Value Date   INR 3.8 (A) 08/28/2019   INR 3.0 08/13/2019   INR 2.6 07/30/2019    Anticoagulation Dosing: Description   INR above goal. Take 1.5 mg today and then Continue taking 4.5 mg every Tues and Thurs and 3 mg all other days. Recheck INR in 3 week w/ OV      INR today: Supratherapeutic. Likely in the setting of decreased Vit K intake. Pt has been previously stable on the current weekly dose. Pt planing on returning to consistent Vit K intake. Will dose adjust today and continue pt or previous maintenance dose. Denies any s/sx of bleeding or bruising.   PLAN Weekly dose was unchanged. Take 1.5 mg today and then continue current dose of 4.5 mg every Tues, Thurs and 3 mg all other days. Recheck INR in 3 weeks w/ OV  Patient Instructions  INR above goal. Take 1.5 mg today and then Continue taking 4.5 mg every Tues and Thurs and 3 mg all other days. Recheck INR in 3 week w/ OV  Patient advised to contact clinic or seek medical attention if signs/symptoms of bleeding or thromboembolism occur.  Patient verbalized understanding by repeating back information and was advised to contact me if further medication-related questions arise.   Follow-up Return in about 19 days (around 09/16/2019).  [name], PharmD  15 minutes spent face-to-face with the patient during the encounter. 50% of time spent on education, including signs/sx bleeding and clotting, as well as food and drug interactions with warfarin. 50% of time was spent on fingerprick POC INR sample collection,processing, results determination, and documentation

## 2019-08-28 NOTE — Patient Instructions (Signed)
INR above goal. Take 1.5 mg today and then Continue taking 4.5 mg every Tues and Thurs and 3 mg all other days. Recheck INR in 3 week w/ OV

## 2019-08-31 ENCOUNTER — Telehealth: Payer: Self-pay

## 2019-08-31 NOTE — Telephone Encounter (Signed)
Pt aware of blood work test results

## 2019-08-31 NOTE — Telephone Encounter (Signed)
-----   Message from [place], [place] sent at 08/30/2019 11:49 PM EDT ----- Results reviewed.Serum potassium and magnesium levels are within normal limits.Kidney function is relatively at baseline.Continue current medical therapy.Call if questions arise.

## 2019-09-16 ENCOUNTER — Ambulatory Visit: Payer: Medicaid Other | Admitting: Cardiology

## 2019-09-17 ENCOUNTER — Other Ambulatory Visit: Payer: Self-pay

## 2019-09-17 ENCOUNTER — Ambulatory Visit: Payer: Medicaid Other | Admitting: Cardiology

## 2019-09-17 ENCOUNTER — Other Ambulatory Visit: Payer: Self-pay | Admitting: Pharmacist

## 2019-09-17 ENCOUNTER — Encounter: Payer: Self-pay | Admitting: Cardiology

## 2019-09-17 VITALS — BP 115/70 | HR 54 | Ht 60.0 in | Wt 204.0 lb

## 2019-09-17 DIAGNOSIS — Z720 Tobacco use: Secondary | ICD-10-CM

## 2019-09-17 DIAGNOSIS — Z792 Long term (current) use of antibiotics: Secondary | ICD-10-CM

## 2019-09-17 DIAGNOSIS — Z5181 Encounter for therapeutic drug level monitoring: Secondary | ICD-10-CM

## 2019-09-17 DIAGNOSIS — Z8679 Personal history of other diseases of the circulatory system: Secondary | ICD-10-CM

## 2019-09-17 DIAGNOSIS — I10 Essential (primary) hypertension: Secondary | ICD-10-CM

## 2019-09-17 DIAGNOSIS — Z955 Presence of coronary angioplasty implant and graft: Secondary | ICD-10-CM

## 2019-09-17 DIAGNOSIS — Z8673 Personal history of transient ischemic attack (TIA), and cerebral infarction without residual deficits: Secondary | ICD-10-CM

## 2019-09-17 DIAGNOSIS — Z6839 Body mass index (BMI) 39.0-39.9, adult: Secondary | ICD-10-CM

## 2019-09-17 DIAGNOSIS — I5022 Chronic systolic (congestive) heart failure: Secondary | ICD-10-CM

## 2019-09-17 DIAGNOSIS — Z7901 Long term (current) use of anticoagulants: Secondary | ICD-10-CM

## 2019-09-17 DIAGNOSIS — E782 Mixed hyperlipidemia: Secondary | ICD-10-CM

## 2019-09-17 DIAGNOSIS — I480 Paroxysmal atrial fibrillation: Secondary | ICD-10-CM

## 2019-09-17 DIAGNOSIS — J449 Chronic obstructive pulmonary disease, unspecified: Secondary | ICD-10-CM

## 2019-09-17 DIAGNOSIS — I255 Ischemic cardiomyopathy: Secondary | ICD-10-CM

## 2019-09-17 DIAGNOSIS — I2510 Atherosclerotic heart disease of native coronary artery without angina pectoris: Secondary | ICD-10-CM

## 2019-09-17 DIAGNOSIS — Z952 Presence of prosthetic heart valve: Secondary | ICD-10-CM

## 2019-09-17 LAB — POCT INR: INR: 3.6 — AB (ref 2.0–3.0)

## 2019-09-17 MED ORDER — ENTRESTO 49-51 MG PO TABS: 1.0000 | ORAL_TABLET | Freq: Two times a day (BID) | ORAL | 0 refills | Status: DC

## 2019-09-17 MED ORDER — CLOPIDOGREL BISULFATE 75 MG PO TABS: 75.0000 mg | ORAL_TABLET | Freq: Every day | ORAL | 1 refills | Status: AC

## 2019-09-17 MED ORDER — ATORVASTATIN CALCIUM 40 MG PO TABS: 40.0000 mg | ORAL_TABLET | Freq: Every day | ORAL | 1 refills | Status: DC

## 2019-09-17 NOTE — Progress Notes (Signed)
Joanna Martinez Date of Birth: [birth year] MRN: [id number] Primary Care Provider:Osei-Bonsu, Iona Beard, MD Former Cardiology Providers: Dr. Thayer Jew [phone] Primary Cardiologist: Rex Kras, DO, [facility] (established care 07/02/2019)   Date: 09/17/19 Last Office Visit: 08/13/2019  Chief Complaint  Patient presents with  . Chronic Heart failure  . Follow-up    HPI  Joanna Martinez is a 59 y.o. 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   Patient was originally referred to the office to establish care given her history of coronary artery disease and status post mechanical mitral valve replacement at the request of her primary care physician.  During the initial visit patient was asymptomatic from a cardiovascular standpoint but noted that she had ran out of cardiac medications for approximately 6 to 12 months.  But was taking her Coumadin on a regular basis.  Based on prior records obtained patient has history of atherosclerotic coronary artery disease with prior PCI to LCX and SJM mechanical mitral valve replacement in 05/2000 at [facility] for rheumatic mitral stenosis on long term [place] with coumadin. Last heart cath was in November 2019 due to angina which demonstrated scattered moderate non-obstructive disease with 65% stenosis of the ostial RPDA and medical therapy was recommended.  Echocardiogram performed in May 2021 noted moderately  reduced left-ventricular systolic function at 30 to 35%, moderately dilated left atrium, functional mitral mechanical valve prosthesis.    Since last office visit her symptoms are quite stable.  She has not been seen at urgent care or ER for cardiovascular symptoms.  Patient is compliant with her medical therapy.  She has lost 9 pounds since last office visit.  And currently not in overt heart failure.  She was prescribed metoprolol and Entresto and is doing well with the medication changes. Her INR today is therapeutic.    FUNCTIONAL STATUS: No exercise or daily routine.    ALLERGIES: No Known Allergies  MEDICATION LIST PRIOR TO VISIT: Current Meds  Medication Sig  . acetaminophen (TYLENOL) 650 MG CR tablet Take 2 tablets by mouth every 8 (eight) hours as needed.  Marland Kitchen allopurinol (ZYLOPRIM) 100 MG tablet Take 1 tablet by mouth daily.  Marland Kitchen atorvastatin (LIPITOR) 40 MG tablet Take 1 tablet (40 mg total) by mouth at bedtime.  . clopidogrel (PLAVIX) 75 MG tablet Take 1 tablet (75 mg total) by mouth daily.  Marland Kitchen doxepin (SINEQUAN) 50 MG capsule Take 1 capsule by mouth daily.  . DULoxetine (CYMBALTA) 30 MG capsule Take 1 capsule by mouth daily.  . ferrous sulfate 325 (65 FE) MG EC tablet Take 1 tablet by mouth daily.  Marland Kitchen gabapentin (NEURONTIN) 400 MG capsule Take 1 capsule by mouth daily.  . metoprolol succinate (TOPROL-XL) 25 MG 24 hr tablet Take 1 tablet (25 mg total) by mouth daily. Hold if systolic blood pressure (top blood pressure number) less than 100 mmHg or heart rate less than 60 bpm (pulse).  . mirtazapine (REMERON) 30 MG tablet Take 1 tablet by mouth daily.  Marland Kitchen oxybutynin (DITROPAN) 5 MG tablet Take 1 tablet by  mouth daily.  . QUEtiapine (SEROQUEL) 300 MG tablet Take 1 tablet by mouth daily.  . sacubitril-valsartan (ENTRESTO) 49-51 MG Take 1 tablet by mouth 2 (two) times daily.  Marland Kitchen warfarin (COUMADIN) 3 MG tablet Take by mouth. As  Directed; Take 4.5 mg every Tues and Thurs and 3 mg all other  days. Refer to most recent anticoagulation note for most updated directions.     PAST MEDICAL HISTORY: Past Medical History:  Diagnosis Date  . Asthma   . Cardiomyopathy ([place])   . CHF (congestive heart failure) ([place])   . Chronic kidney disease   . COPD (chronic obstructive pulmonary disease) ([place])   . Coronary artery disease   . H/O heart valve replacement with mechanical valve   . Heart attack ([place])   . Hyperlipidemia   . Hypertension   . Paroxysmal atrial fibrillation (HCC)   . Stroke [facility])     PAST SURGICAL HISTORY: Past Surgical History:  Procedure Laterality Date  . CARDIAC SURGERY    . CORONARY ANGIOPLASTY WITH STENT PLACEMENT    . MITRAL VALVE SURGERY      FAMILY HISTORY: The patient family history includes Diabetes in her brother, brother, and sister; HIV/AIDS in her father and mother; Hyperlipidemia in her brother, brother, and sister; Hypertension in her brother, brother, and sister.  SOCIAL HISTORY:  The patient  reports that she has been smoking. She has been smoking about 1.00 pack per day. She has never used smokeless tobacco. She reports previous alcohol use. She reports that she does not use drugs.  REVIEW OF SYSTEMS: Review of Systems  Constitutional: Negative for chills and fever.  HENT: Negative for hoarse voice and nosebleeds.   Eyes: Negative for discharge, double vision and pain.  Cardiovascular: Negative for chest pain, claudication, dyspnea on exertion, leg swelling, near-syncope, orthopnea, palpitations, paroxysmal nocturnal dyspnea and syncope.  Respiratory: Negative for hemoptysis and shortness of breath.   Musculoskeletal: Negative for muscle cramps and myalgias.  Gastrointestinal: Negative for abdominal pain, constipation, diarrhea, hematemesis, hematochezia, melena, nausea and vomiting.  Neurological: Negative for dizziness and light-headedness.    PHYSICAL EXAM: Vitals with BMI 09/17/2019 08/13/2019 07/02/2019  Height 5' 0"  5' 0"  5' 0"     Weight 204 lbs 213 lbs 6 oz 211 lbs  BMI 39.84 94.70 96.28  Systolic 366 294 765  Diastolic 70 69 57  Pulse 54 97 93   CONSTITUTIONAL: Well-developed and well-nourished. No acute distress.  SKIN: Skin is warm and dry. No rash noted. No cyanosis. No pallor. No jaundice HEAD: Normocephalic and atraumatic.  EYES: No scleral icterus MOUTH/THROAT: Moist oral membranes.  NECK: No JVD present. No thyromegaly noted. No carotid bruits   LYMPHATIC: No visible cervical adenopathy.  CHEST Normal respiratory effort. No intercostal retractions  LUNGS: Clear to auscultation bilaterally. No stridor. No wheezes. No rales.  CARDIOVASCULAR: Regular rate and rhythm, positive Y6-T0, mechanical click heard at the apex, no murmurs rubs or gallops appreciated. ABDOMINAL: Obese, soft, nontender, nondistended, positive bowel sounds all 4 quadrants. No apparent ascites.  EXTREMITIES: No peripheral edema  HEMATOLOGIC: No significant bruising NEUROLOGIC: Oriented to person, place, and time. Nonfocal. Normal muscle tone.  PSYCHIATRIC: Normal mood and affect. Normal behavior. Cooperative   CARDIAC DATABASE: Status post SJM mechanical mitral valve placement in March 2002 at [facility] due to rheumatic mitral stenosis.  EKG: 07/02/2019: Sinus rhythm, 94 bpm, normal axis, incomplete right bundle branch block, left atrial enlargement, nonspecific T wave abnormality.   Echocardiogram: 04/13/2019 outside records: LVEF 40-45%,  grade 1 diastolic impairment, moderately global hypokinesis.  Mechanical mitral valve present.  Valve function appears normal.  No evidence of mitral valve stenosis or regurgitation.  Mitral valve pressure halftime 38 ms.  07/10/2019: Moderately depressed LV systolic function with visual EF 30-35%. Left ventricle cavity is normal in size. Normal global wall motion. Indeterminate diastolic filling pattern. Calculated EF 35%. Left atrial cavity is moderately dilated at 4.5 cm. Mechanical mitral  valve.  Trace mitral regurgitation, withing physiologic limits.  Normal mitral valve leaflet mobility. Peak PR of 4.6 and mean PG of 4 mm Hg. Trace tricuspid regurgitation. No evidence of pulmonary hypertension.   Stress Testing: We will obtain outside records.  Heart Catheterization: November 2019 per report: Proximal LAD 20%. LCx stent patent.  Discrete 20% narrowing noted distal to the stent.  A separate 30% stenosis was noted in the distal LCx. RCA: Diffuse 20-30% narrowing in the proximal/mid/distal segments. Posterior atrioventricular branch 50 to 60% stenosis in its distal segment prior to the origin of the most distal posterolateral branch. Right PDA smooth tubular lesions with 60 to 70% luminal narrowing.    LABORATORY DATA: No flowsheet data found.  CMP Latest Ref Rng & Units 08/24/2019  Glucose 65 - 99 mg/dL 130(H)  BUN 8 - 27 mg/dL 13  Creatinine 0.57 - 1.00 mg/dL 0.96  Sodium 134 - 144 mmol/L 138  Potassium 3.5 - 5.2 mmol/L 4.7  Chloride 96 - 106 mmol/L 108(H)  CO2 20 - 29 mmol/L 18(L)  Calcium 8.7 - 10.3 mg/dL 9.3    Lipid Panel  No results found for: CHOL, TRIG, HDL, CHOLHDL, VLDL, LDLCALC, LDLDIRECT, LABVLDL  No results found for: HGBA1C No components found for: NTPROBNP No results found for: TSH  BMP Recent Labs    08/24/19 1139  NA 138  K 4.7  CL 108*  CO2 18*  GLUCOSE 130*  BUN 13  CREATININE 0.96  CALCIUM 9.3  GFRNONAA 64  GFRAA 74    CBC No results for input(s): WBC, RBC, HGB, HCT, PLT, MCV, MCH, MCHC, RDW, LYMPHSABS, MONOABS, EOSABS, BASOSABS in the last 168 hours.  Invalid input(s): NEUTRABS  HEMOGLOBIN A1C No results found for: HGBA1C, MPG  Cardiac Panel (last 3 results) No results for input(s): CKTOTAL, CKMB, TROPONINI, RELINDX in the last 8760 hours. No results for input(s): TROPIPOC in the last 8760 hours.  BNP (last 3 results) Recent Labs    08/24/19 1139  PROBNP 109    TSH No results for input(s): TSH in the last 8760  hours.  CHOLESTEROL No results for input(s): CHOL in the last 8760 hours.  Hepatic Function Panel No results for input(s): PROT, ALBUMIN, AST, ALT, ALKPHOS, BILITOT, BILIDIR, IBILI in the last 8760 hours.   External Labs: Collected: 06/10/2019 Creatinine 1.06 mg/dL. eGFR: 66 mL/min per 1.73 m Lipid profile: Total cholesterol 304, triglycerides 310, HDL 53, LDL 190 Hemoglobin A1c: 6.3 TSH: 1.26 and T4 0.68 (free)  IMPRESSION:    ICD-10-CM   1. Chronic HFrEF (heart failure with reduced ejection fraction) (HCC)  I50.22 sacubitril-valsartan (ENTRESTO) 49-51 MG    Basic metabolic panel    Magnesium    Pro b natriuretic peptide (BNP)    Pro b natriuretic peptide (BNP)    Magnesium    Basic metabolic panel  2. Paroxysmal atrial fibrillation (HCC)  I48.0   3. Monitoring for long-term anticoagulant use  Z51.81    Z79.01   4. Long term (current) use of anticoagulants  Z79.01   5. Atherosclerosis of native  coronary artery of native heart without angina pectoris  I25.10   6. History of coronary angioplasty with insertion of stent  Z95.5   7. Ischemic cardiomyopathy  I25.5   8. Hx of mitral valve replacement with mechanical valve  Z95.2   9. Hx of rheumatic fever  Z86.79   10. Need for prophylactic antibiotic  Z79.2   11. History of stroke  Z86.73 clopidogrel (PLAVIX) 75 MG tablet  12. Benign hypertension  I10   13. Mixed hyperlipidemia  E78.2 atorvastatin (LIPITOR) 40 MG tablet  14. Tobacco use  Z72.0   15. Chronic obstructive pulmonary disease, unspecified COPD type (HCC)  J44.9   16. Class 2 severe obesity due to excess calories with serious comorbidity and body mass index (BMI) of 39.0 to 39.9 in adult [facility])  E66.01    Z68.39      RECOMMENDATIONS: Joanna Martinez is a 59 y.o. female whose past medical history and cardiac risk factors include: History of atherosclerotic coronary artery disease with prior PCI performed on October 21, 2017 with DES to LCx and [place] mechanical  mitral valve replacement in March 2002 at [facility] for rheumatic mitral stenosis, on oral anticoagulation, hypertension, tobacco smoker, several small strokes, paroxysmal atrial fibrillation (outside records from [facility]), HFrEF, ischemic cardiomyopathy, postmenopausal female, advanced age, obesity.  Atherosclerotic coronary artery disease with prior PCI without angina:  In comparing the outside records there is a change in LVEF compared to February 2021.  This could be secondary to the fact that the patient ran out of her medications for 6-12 months prior to establishing care with myself.  Given the fact that she is asymptomatic would like to uptitrate her guideline directed medical therapy and to reevaluate her symptoms and EF.  Patient is agreeable with the plan of care.  Continue Plavix, refilled.  Continue metoprolol 25 mg p.o. daily.  Chronic HFrEF:  Clinically patient is euvolemic.  Patient has lost approximately 9 pounds since last visit.  Entresto started 08/13/2019. Uptitrated Entresto 49/64m p.o. twice daily.  Prescription provided, recheck labs in 1 week  Continue Toprol-XL.  Continue Plavix.  Continue statin therapy.  Recommend daily weight check, strict I/O's  Fluid restriction to <2L per day, Na r K estriction < 2g per day  Ischemic cardiomyopathy: See above  Atrial fibrillation, paroxysmal: . Rate control: Start metoprolol . Rhythm control: N/A . Thromboembolic prophylaxis: Currently on coumadin goal INR 2.5 to 3.5.  .Marland KitchenCHA2DS2-VASc SCORE is 6 which correlates to 9.8 % risk of stroke per year. . Patient does not endorse any evidence of bleeding.  Long--term oral anticoagulation:   Indication: Paroxysmal atrial fibrillation and mechanical mitral valve.  Currently on Coumadin.  Patient does not endorse any evidence of bleeding.  INR at today's office 3.6 continue current coumadin dose.   Follow up INR check w/ coumadin clinic in  2 week.   [facility] to rheumatic mitral valve stenosis in  March 2002:  Continue coumadin w/ goal INR 2.5-3.5  Active tobacco smoker: Improving   Educated on the importance of smoking cessation. Has decreased the number of cigarette on daily basis.  Encouraged her to stop smoking by the next follow up visit.    Hyperlipidemia:  Currently managed by primary team.  Continue Lipitor 40 mg p.o. nightly.  Patient does not endorse any myalgias.   Patient requesting refill on Lipitor.  FINAL MEDICATION LIST END OF ENCOUNTER: Meds ordered this encounter  Medications  .  atorvastatin (LIPITOR) 40 MG tablet    Sig: Take 1 tablet (40 mg total) by mouth at bedtime.    Dispense:  90 tablet    Refill:  1  . clopidogrel (PLAVIX) 75 MG tablet    Sig: Take 1 tablet (75 mg total) by mouth daily.    Dispense:  90 tablet    Refill:  1  . sacubitril-valsartan (ENTRESTO) 49-51 MG    Sig: Take 1 tablet by mouth 2 (two) times daily.    Dispense:  60 tablet    Refill:  0    Medications Discontinued During This Encounter  Medication Reason  . albuterol (VENTOLIN HFA) 108 (90 Base) MCG/ACT inhaler Patient Preference  . atorvastatin (LIPITOR) 40 MG tablet Reorder  . clopidogrel (PLAVIX) 75 MG tablet Reorder     Current Outpatient Medications:  .  acetaminophen (TYLENOL) 650 MG CR tablet, Take 2 tablets by mouth every 8 (eight) hours as needed., Disp: , Rfl:  .  allopurinol (ZYLOPRIM) 100 MG tablet, Take 1 tablet by mouth daily., Disp: , Rfl:  .  atorvastatin (LIPITOR) 40 MG tablet, Take 1 tablet (40 mg total) by mouth at bedtime., Disp: 90 tablet, Rfl: 1 .  clopidogrel (PLAVIX) 75 MG tablet, Take 1 tablet (75 mg total) by mouth daily., Disp: 90 tablet, Rfl: 1 .  doxepin (SINEQUAN) 50 MG capsule, Take 1 capsule by mouth daily., Disp: , Rfl:  .  DULoxetine (CYMBALTA) 30 MG capsule, Take 1 capsule by mouth daily., Disp: , Rfl:  .  ferrous sulfate 325  (65 FE) MG EC tablet, Take 1 tablet by mouth daily., Disp: , Rfl:  .  gabapentin (NEURONTIN) 400 MG capsule, Take 1 capsule by mouth daily., Disp: , Rfl:  .  metoprolol succinate (TOPROL-XL) 25 MG 24 hr tablet, Take 1 tablet (25 mg total) by mouth daily. Hold if systolic blood pressure (top blood pressure number) less than 100 mmHg or heart rate less than 60 bpm (pulse)., Disp: 30 tablet, Rfl: 5 .  mirtazapine (REMERON) 30 MG tablet, Take 1 tablet by mouth daily., Disp: , Rfl:  .  oxybutynin (DITROPAN) 5 MG tablet, Take 1 tablet by mouth daily., Disp: , Rfl:  .  QUEtiapine (SEROQUEL) 300 MG tablet, Take 1 tablet by mouth daily., Disp: , Rfl:  .  sacubitril-valsartan (ENTRESTO) 49-51 MG, Take 1 tablet by mouth 2 (two) times daily., Disp: 60 tablet, Rfl: 0 .  warfarin (COUMADIN) 3 MG tablet, Take by mouth. As  Directed; Take 4.5 mg every Tues and Thurs and 3 mg all other days. Refer to most recent anticoagulation note for most updated directions., Disp: , Rfl:   Orders Placed This Encounter  Procedures  . Basic metabolic panel  . Magnesium  . Pro b natriuretic peptide (BNP)  . POCT INR    There are no Patient Instructions on file for this visit.   --Continue cardiac medications as reconciled in final medication list. --Return in about 4 weeks (around 10/15/2019) for heart failure management.. Or sooner if needed. --Continue follow-up with your primary care physician regarding the management of your other chronic comorbid conditions.  Patient's questions and concerns were addressed to her satisfaction. She voices understanding of the instructions provided during this encounter.   This note was created using a voice recognition software as a result there may be grammatical errors inadvertently enclosed that do not reflect the nature of this encounter. Every attempt is made to correct such errors.  Total Time Spent: 50 minute.  Rex Kras, [place], [facility]  Pager: [phone] Office:  [phone]

## 2019-09-20 MED ORDER — WARFARIN SODIUM 3 MG PO TABS: 3.0000 mg | ORAL_TABLET | Freq: Every day | ORAL | 2 refills | Status: DC

## 2019-10-16 ENCOUNTER — Encounter: Payer: Self-pay | Admitting: Cardiology

## 2019-10-16 ENCOUNTER — Ambulatory Visit: Payer: Medicaid Other | Admitting: Cardiology

## 2019-10-16 VITALS — BP 106/62 | HR 84 | Resp 15 | Ht 60.0 in | Wt 206.0 lb

## 2019-10-16 DIAGNOSIS — Z720 Tobacco use: Secondary | ICD-10-CM

## 2019-10-16 DIAGNOSIS — Z952 Presence of prosthetic heart valve: Secondary | ICD-10-CM

## 2019-10-16 DIAGNOSIS — I480 Paroxysmal atrial fibrillation: Secondary | ICD-10-CM

## 2019-10-16 DIAGNOSIS — Z8679 Personal history of other diseases of the circulatory system: Secondary | ICD-10-CM

## 2019-10-16 DIAGNOSIS — Z792 Long term (current) use of antibiotics: Secondary | ICD-10-CM

## 2019-10-16 DIAGNOSIS — Z8673 Personal history of transient ischemic attack (TIA), and cerebral infarction without residual deficits: Secondary | ICD-10-CM

## 2019-10-16 DIAGNOSIS — I10 Essential (primary) hypertension: Secondary | ICD-10-CM

## 2019-10-16 DIAGNOSIS — I5022 Chronic systolic (congestive) heart failure: Secondary | ICD-10-CM

## 2019-10-16 DIAGNOSIS — I255 Ischemic cardiomyopathy: Secondary | ICD-10-CM

## 2019-10-16 DIAGNOSIS — Z7901 Long term (current) use of anticoagulants: Secondary | ICD-10-CM

## 2019-10-16 DIAGNOSIS — E782 Mixed hyperlipidemia: Secondary | ICD-10-CM

## 2019-10-16 DIAGNOSIS — Z6841 Body Mass Index (BMI) 40.0 and over, adult: Secondary | ICD-10-CM

## 2019-10-16 DIAGNOSIS — Z955 Presence of coronary angioplasty implant and graft: Secondary | ICD-10-CM

## 2019-10-16 DIAGNOSIS — I2510 Atherosclerotic heart disease of native coronary artery without angina pectoris: Secondary | ICD-10-CM

## 2019-10-16 DIAGNOSIS — Z5181 Encounter for therapeutic drug level monitoring: Secondary | ICD-10-CM

## 2019-10-16 LAB — POCT INR: INR: 3.4 — AB (ref 2.0–3.0)

## 2019-10-16 NOTE — Progress Notes (Signed)
Joanna Martinez Date of Birth: Oct 07, 1960 MRN: [id number] Primary Care Provider:Osei-Bonsu, Iona Beard, MD Former Cardiology Providers: Dr. Thayer Jew [phone] Primary Cardiologist: Rex Kras, DO, [facility] (established care 07/02/2019)  Date: 10/16/19 Last Office Visit: 09/17/2019  Chief Complaint  Patient presents with  . Follow-up    4 week  . HFrEF    HPI  Joanna Martinez is a 59 y.o. 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   Patient was originally referred to the office to establish care given her history of coronary artery disease and status post mechanical mitral valve replacement at the request of her primary care physician.  During the initial visit patient was asymptomatic from a cardiovascular standpoint but noted that she had ran out of cardiac medications for approximately 6 to 12 months.  But was taking her Coumadin on a regular basis.  Based on prior records obtained patient has history of atherosclerotic coronary artery disease with prior PCI to LCX and SJM mechanical mitral valve replacement in 05/2000 at [facility] for rheumatic mitral stenosis on long term [place] with coumadin. Last heart cath was in November 2019 due to angina which demonstrated scattered moderate non-obstructive disease with 65% stenosis of the ostial RPDA and medical therapy was recommended.  Echocardiogram performed in May 2021 noted moderately  reduced left-ventricular systolic function at 30 to 35%, moderately dilated left atrium, functional mitral mechanical valve prosthesis.    Since last office visit her symptoms are quite stable.  She has not been seen at urgent care or ER for cardiovascular symptoms.  Patient is compliant with her medical therapy.  Since last visit patient has gained approximately 2 pounds.  She is not able to check her daily weights as per weight scale has appropriate.  I do not have the most recent weight scale from principal care management to review with her at today's visit. Clinically she is euvolemic and not in overt heart failure.  Patient was uptitrated on Entresto at last office visit but states that she forgot to get her recent blood work prior to today's office visit as she had a recent death in her family due to Covid infection.  Patient states that she will get the blood work done later today after the office visit.  Patient's INR is therapeutic at today's office visit and she does not endorse any evidence of bleeding.    FUNCTIONAL STATUS: No exercise or daily routine.    ALLERGIES: No Known Allergies  MEDICATION LIST PRIOR TO VISIT: Current Meds  Medication Sig  . acetaminophen (TYLENOL) 650 MG CR tablet Take 2 tablets by mouth every 8 (eight) hours as needed.  Marland Kitchen allopurinol (ZYLOPRIM) 100 MG tablet Take 1 tablet by mouth daily.  Marland Kitchen atorvastatin (LIPITOR) 40 MG tablet Take 1 tablet (40 mg total) by mouth at bedtime.  . clopidogrel (PLAVIX) 75 MG tablet Take 1 tablet (75 mg total) by mouth daily.  . ferrous sulfate 325 (65 FE) MG EC tablet Take 1 tablet by mouth daily.  Marland Kitchen gabapentin (NEURONTIN) 400 MG capsule Take  1 capsule by mouth daily.  . metoprolol succinate (TOPROL-XL) 25 MG 24 hr tablet Take 1 tablet (25 mg total) by mouth daily. Hold if systolic blood pressure (top blood pressure number) less than 100 mmHg or heart rate less than 60 bpm (pulse).  . mirtazapine (REMERON) 30 MG tablet Take 1  tablet by mouth daily.  Marland Kitchen oxybutynin (DITROPAN) 5 MG tablet Take 1 tablet by mouth daily.  . QUEtiapine (SEROQUEL) 300 MG tablet Take 1 tablet by mouth daily.  . sacubitril-valsartan (ENTRESTO) 49-51 MG Take 1 tablet by mouth 2 (two) times daily.  Marland Kitchen warfarin (COUMADIN) 3 MG tablet Take 1 tablet (3 mg total) by mouth daily. As  Directed; Take 4.5 mg every Tues and Thurs and 3 mg all other days. Refer to most recent anticoagulation note for most updated directions.     PAST MEDICAL HISTORY: Past Medical History:  Diagnosis Date  . Asthma   . Cardiomyopathy ([place])   . CHF (congestive heart failure) ([place])   . Chronic kidney disease   . COPD (chronic obstructive pulmonary disease) ([place])   . Coronary artery disease   . H/O heart valve replacement with mechanical valve   . Heart attack ([place])   . Hyperlipidemia   . Hypertension   . Paroxysmal atrial fibrillation (HCC)   . Stroke [facility])     PAST SURGICAL HISTORY: Past Surgical History:  Procedure Laterality Date  . CARDIAC SURGERY    . CORONARY ANGIOPLASTY WITH STENT PLACEMENT    . MITRAL VALVE SURGERY      FAMILY HISTORY: The patient family history includes Diabetes in her brother, brother, and sister; HIV/AIDS in her father and mother; Hyperlipidemia in her brother, brother, and sister; Hypertension in her brother, brother, and sister.  SOCIAL HISTORY:  The patient  reports that she has been smoking. She has been smoking about 1.00 pack per day. She has never used smokeless tobacco. She reports previous alcohol use. She reports that she does not use drugs.  REVIEW OF SYSTEMS: Review of Systems  Constitutional: Negative for chills and fever.  HENT: Negative for hoarse voice and nosebleeds.   Eyes: Negative for discharge, double vision and pain.  Cardiovascular: Negative for chest pain, claudication, dyspnea on exertion, leg swelling, near-syncope, orthopnea, palpitations, paroxysmal nocturnal dyspnea and syncope.  Respiratory:  Negative for hemoptysis and shortness of breath.   Musculoskeletal: Negative for muscle cramps and myalgias.  Gastrointestinal: Negative for abdominal pain, constipation, diarrhea, hematemesis, hematochezia, melena, nausea and vomiting.  Neurological: Negative for dizziness and light-headedness.    PHYSICAL EXAM: Vitals with BMI 10/16/2019 09/17/2019 08/13/2019  Height 5' 0"  5' 0"  5' 0"   Weight 206 lbs 204 lbs 213 lbs 6 oz  BMI 40.23 16.10 96.04  Systolic 540 981 191  Diastolic 62 70 69  Pulse 84 54 97   CONSTITUTIONAL: Well-developed and well-nourished. No acute distress.  SKIN: Skin is warm and dry. No rash noted. No cyanosis. No pallor. No jaundice HEAD: Normocephalic and atraumatic.  EYES: No scleral icterus MOUTH/THROAT: Moist oral membranes.  NECK: No JVD present. No thyromegaly noted. No carotid bruits   LYMPHATIC: No visible cervical adenopathy.  CHEST Normal respiratory effort. No intercostal retractions  LUNGS: Clear to auscultation bilaterally. No stridor. No wheezes. No rales.  CARDIOVASCULAR: Regular rate and rhythm, positive Y7-W2, mechanical click heard at the apex, no murmurs rubs or gallops appreciated. ABDOMINAL: Obese, soft, nontender, nondistended, positive bowel sounds all 4 quadrants. No apparent ascites.  EXTREMITIES: No peripheral edema  HEMATOLOGIC:  No significant bruising NEUROLOGIC: Oriented to person, place, and time. Nonfocal. Normal muscle tone.  PSYCHIATRIC: Normal mood and affect. Normal behavior. Cooperative   CARDIAC DATABASE: Status post SJM mechanical mitral valve placement in March 2002 at [facility] due to rheumatic mitral stenosis.  EKG: 07/02/2019: Sinus rhythm, 94 bpm, normal axis, incomplete right bundle branch block, left atrial enlargement, nonspecific T wave abnormality.   Echocardiogram: 04/13/2019 outside records: LVEF 02-77%, grade 1 diastolic impairment, moderately global hypokinesis.  Mechanical mitral valve present.  Valve  function appears normal.  No evidence of mitral valve stenosis or regurgitation.  Mitral valve pressure halftime 38 ms.  07/10/2019: Moderately depressed LV systolic function with visual EF 30-35%. Left ventricle cavity is normal in size. Normal global wall motion. Indeterminate diastolic filling pattern. Calculated EF 35%. Left atrial cavity is moderately dilated at 4.5 cm. Mechanical mitral valve.  Trace mitral regurgitation, withing physiologic limits.  Normal mitral valve leaflet mobility. Peak PR of 4.6 and mean PG of 4 mm Hg. Trace tricuspid regurgitation. No evidence of pulmonary hypertension.   Stress Testing: We will obtain outside records.  Heart Catheterization: November 2019 per report: Proximal LAD 20%. LCx stent patent.  Discrete 20% narrowing noted distal to the stent.  A separate 30% stenosis was noted in the distal LCx. RCA: Diffuse 20-30% narrowing in the proximal/mid/distal segments. Posterior atrioventricular branch 50 to 60% stenosis in its distal segment prior to the origin of the most distal posterolateral branch. Right PDA smooth tubular lesions with 60 to 70% luminal narrowing.    LABORATORY DATA: No flowsheet data found.  CMP Latest Ref Rng & Units 08/24/2019  Glucose 65 - 99 mg/dL 130(H)  BUN 8 - 27 mg/dL 13  Creatinine 0.57 - 1.00 mg/dL 0.96  Sodium 134 - 144 mmol/L 138  Potassium 3.5 - 5.2 mmol/L 4.7  Chloride 96 - 106 mmol/L 108(H)  CO2 20 - 29 mmol/L 18(L)  Calcium 8.7 - 10.3 mg/dL 9.3    Lipid Panel  No results found for: CHOL, TRIG, HDL, CHOLHDL, VLDL, LDLCALC, LDLDIRECT, LABVLDL  No results found for: HGBA1C No components found for: NTPROBNP No results found for: TSH  BMP Recent Labs    08/24/19 1139  NA 138  K 4.7  CL 108*  CO2 18*  GLUCOSE 130*  BUN 13  CREATININE 0.96  CALCIUM 9.3  GFRNONAA 64  GFRAA 74    CBC No results for input(s): WBC, RBC, HGB, HCT, PLT, MCV, MCH, MCHC, RDW, LYMPHSABS, MONOABS, EOSABS, BASOSABS in the last  168 hours.  Invalid input(s): NEUTRABS  HEMOGLOBIN A1C No results found for: HGBA1C, MPG  Cardiac Panel (last 3 results) No results for input(s): CKTOTAL, CKMB, TROPONINI, RELINDX in the last 8760 hours. No results for input(s): TROPIPOC in the last 8760 hours.  BNP (last 3 results) Recent Labs    08/24/19 1139  PROBNP 109    TSH No results for input(s): TSH in the last 8760 hours.  CHOLESTEROL No results for input(s): CHOL in the last 8760 hours.  Hepatic Function Panel No results for input(s): PROT, ALBUMIN, AST, ALT, ALKPHOS, BILITOT, BILIDIR, IBILI in the last 8760 hours.   External Labs: Collected: 06/10/2019 Creatinine 1.06 mg/dL. eGFR: 66 mL/min per 1.73 m Lipid profile: Total cholesterol 304, triglycerides 310, HDL 53, LDL 190 Hemoglobin A1c: 6.3 TSH: 1.26 and T4 0.68 (free)  IMPRESSION:    ICD-10-CM   1. Chronic HFrEF (heart failure with reduced ejection fraction) (HCC)  I50.22   2. Paroxysmal atrial  fibrillation ([place])  I48.0   3. Long term (current) use of anticoagulants  Z79.01   4. Monitoring for long-term anticoagulant use  Z51.81 POCT INR   Z79.01   5. Atherosclerosis of native coronary artery of native heart without angina pectoris  I25.10   6. History of coronary angioplasty with insertion of stent  Z95.5   7. Ischemic cardiomyopathy  I25.5   8. Hx of mitral valve replacement with mechanical valve  Z95.2   9. Hx of rheumatic fever  Z86.79   10. Need for prophylactic antibiotic  Z79.2   11. History of stroke  Z86.73   12. Benign hypertension  I10   13. Mixed hyperlipidemia  E78.2   14. Tobacco use  Z72.0   15. Class 3 severe obesity due to excess calories with serious comorbidity and body mass index (BMI) of 40.0 to 44.9 in adult [facility])  E66.01    Z68.41      RECOMMENDATIONS: Ayasha Ellingsen is a 59 y.o. 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  Atherosclerotic coronary artery disease with prior PCI without angina:  In comparing the outside records there is a change in LVEF compared to February 2021.  This could be secondary to the fact that the patient ran out of her medications for 6-12 months prior to establishing care with myself.  Given the fact that she is asymptomatic would like to uptitrate her guideline directed medical therapy and to reevaluate her symptoms and EF.  Patient is agreeable with the plan of care.  Continue Plavix.  Continue metoprolol 25 mg p.o. daily.  Chronic HFrEF:  Clinically patient is euvolemic.  Patient has gained approximately 2 pounds since last visit.  Entresto started 08/13/2019. Uptitrated Entresto 49/77m p.o. twice daily  She did not check her labs after going up on Entresto. She will check it today. If Cr is stable will add Aldactone  Continue Toprol-XL.  Continue Plavix.  Continue statin therapy.  Recommend daily weight check, strict I/O's  Fluid restriction to <2L per day, Na r K estriction < 2g per day  Currently in principle care management for HF management; however, weight scale stop working. We will have it exchanged for her.   Ischemic cardiomyopathy: See above  Atrial fibrillation, paroxysmal: . Rate control: Start metoprolol . Rhythm control: N/A . Thromboembolic prophylaxis: Currently on coumadin goal INR 2.5 to 3.5. Today's INR is 3.4.  . CHA2DS2-VASc SCORE is 6 which correlates to 9.8 % risk of stroke per year. . Patient does not endorse any evidence of bleeding.  Long--term oral  anticoagulation:   Indication: Paroxysmal atrial fibrillation and mechanical mitral valve.  Currently on Coumadin.  Patient does not endorse any evidence of bleeding.  INR at today's office 3.4 continue current coumadin dose.  Follow-up in 1 month for INR check.  [facility] to rheumatic mitral valve stenosis in  March 2002:  Continue coumadin w/ goal INR 2.5-3.5  Active tobacco smoker: Improving   Educated on the importance of smoking cessation. Currently 10 cigarette per day.  Encouraged her to stop smoking by the next  follow up visit.    Hyperlipidemia:  Currently managed by primary team.  Continue Lipitor 40 mg p.o. nightly.  Patient does not endorse any myalgias.  FINAL MEDICATION LIST END OF ENCOUNTER: No orders of the defined types were placed in this encounter.    Current Outpatient Medications:  .  acetaminophen (TYLENOL) 650 MG CR tablet, Take 2 tablets by mouth every 8 (eight) hours as needed., Disp: , Rfl:  .  allopurinol (ZYLOPRIM) 100 MG tablet, Take 1 tablet by mouth daily., Disp: , Rfl:  .  atorvastatin (LIPITOR) 40 MG tablet, Take 1 tablet (40 mg total) by mouth at bedtime., Disp: 90 tablet, Rfl: 1 .  clopidogrel (PLAVIX) 75 MG tablet, Take 1 tablet (75 mg total) by mouth daily., Disp: 90 tablet, Rfl: 1 .  ferrous sulfate 325 (65 FE) MG EC tablet, Take 1 tablet by mouth daily., Disp: , Rfl:  .  gabapentin (NEURONTIN) 400 MG capsule, Take 1 capsule by mouth daily., Disp: , Rfl:  .  metoprolol succinate (TOPROL-XL) 25 MG 24 hr tablet, Take 1 tablet (25 mg total) by mouth daily. Hold if systolic blood pressure (top blood pressure number) less than 100 mmHg or heart rate less than 60 bpm (pulse)., Disp: 30 tablet, Rfl: 5 .  mirtazapine (REMERON) 30 MG tablet, Take 1 tablet by mouth daily., Disp: , Rfl:  .  oxybutynin (DITROPAN) 5 MG tablet, Take 1 tablet by mouth daily., Disp: , Rfl:  .  QUEtiapine (SEROQUEL) 300 MG  tablet, Take 1 tablet by mouth daily., Disp: , Rfl:  .  sacubitril-valsartan (ENTRESTO) 49-51 MG, Take 1 tablet by mouth 2 (two) times daily., Disp: 60 tablet, Rfl: 0 .  warfarin (COUMADIN) 3 MG tablet, Take 1 tablet (3 mg total) by mouth daily. As  Directed; Take 4.5 mg every Tues and Thurs and 3 mg all other days. Refer to most recent anticoagulation note for most updated directions., Disp: 100 tablet, Rfl: 2 .  DULoxetine (CYMBALTA) 30 MG capsule, Take 1 capsule by mouth daily. (Patient not taking: Reported on 10/16/2019), Disp: , Rfl:   Orders Placed This Encounter  Procedures  . POCT INR    There are no Patient Instructions on file for this visit.   --Continue cardiac medications as reconciled in final medication list. --Return in about 3 months (around 01/16/2020) for heart failure management.. Or sooner if needed. --Continue follow-up with your primary care physician regarding the management of your other chronic comorbid conditions.  Patient's questions and concerns were addressed to her satisfaction. She voices understanding of the instructions provided during this encounter.   This note was created using a voice recognition software as a result there may be grammatical errors inadvertently enclosed that do not reflect the nature of this encounter. Every attempt is made to correct such errors.  Total Time Spent: 50 minute.   Rex Kras, [place], [facility]  Pager: [phone] Office: [phone]

## 2019-10-29 ENCOUNTER — Other Ambulatory Visit: Payer: Self-pay

## 2019-10-30 ENCOUNTER — Other Ambulatory Visit: Payer: Self-pay | Admitting: Cardiology

## 2019-10-30 DIAGNOSIS — I5022 Chronic systolic (congestive) heart failure: Secondary | ICD-10-CM

## 2019-11-05 ENCOUNTER — Other Ambulatory Visit: Payer: Self-pay

## 2019-11-05 ENCOUNTER — Ambulatory Visit: Payer: Medicaid Other | Admitting: Pharmacist

## 2019-11-05 DIAGNOSIS — I4891 Unspecified atrial fibrillation: Secondary | ICD-10-CM

## 2019-11-05 DIAGNOSIS — Z952 Presence of prosthetic heart valve: Secondary | ICD-10-CM

## 2019-11-05 DIAGNOSIS — Z5181 Encounter for therapeutic drug level monitoring: Secondary | ICD-10-CM

## 2019-11-05 LAB — POCT INR: INR: 3.1 — AB (ref 2.0–3.0)

## 2019-11-05 NOTE — Progress Notes (Signed)
Anticoagulation Management Joanna Martinez is a 59 y.o. female who reports to the clinic for monitoring of warfarin treatment.    Indication: s/p [facility] Jude mechanical mitral valve replacement in March 2002   Duration: indefinite Supervising physician: Tessa Lerner  Anticoagulation Clinic Visit History:  Patient does not report signs/symptoms of bleeding or thromboembolism. Hx of multiple previous strokes. Most recent incidence 3 or 4 years ago. Pt also on Plavix for history of coronary artery disease status post drug-eluting stent in August 2019. Noteded major DDI b/w warfarin and plavix of increased bleeding risk. Noted DDI b/w warfarin and allopurinol about increased anticoagulant effect as well. Pt denies any recent allopurinol dose changes.Pt confirmed to have a tan colored (3 mg) warfarin tablet at home. Pt started taking warfarin in the evening as previously discussed.     Other recent changes: No changes in medications or lifestyle. Pt normally has 1 serving of collard greens/week and 2 serving of salad TIW.    Anticoagulation Episode Summary    Current INR goal:  2.5-3.5  TTR:  63.2 % (3.6 mo)  Next INR check:  12/03/2019  INR from last check:  3.1 (11/05/2019)  Weekly max warfarin dose:  24 mg  Target end date:  Indefinite  INR check location:    Preferred lab:    Send INR reminders to:     Indications   S/P mitral valve replacement [Z95.2] Atrial fibrillation (HCC) [I48.91] Monitoring for long-term anticoagulant use [Z51.81 Z79.01]       Comments:          No Known Allergies  Current Outpatient Medications:  .  acetaminophen (TYLENOL) 650 MG CR tablet, Take 2 tablets by mouth every 8 (eight) hours as needed., Disp: , Rfl:  .  allopurinol (ZYLOPRIM) 100 MG tablet, Take 1 tablet by mouth daily., Disp: , Rfl:  .  atorvastatin (LIPITOR) 40 MG tablet, Take 1 tablet (40 mg total) by mouth at bedtime., Disp: 90 tablet, Rfl: 1 .  clopidogrel (PLAVIX) 75 MG tablet, Take 1  tablet (75 mg total) by mouth daily., Disp: 90 tablet, Rfl: 1 .  DULoxetine (CYMBALTA) 30 MG capsule, Take 1 capsule by mouth daily. (Patient not taking: Reported on 10/16/2019), Disp: , Rfl:  .  ENTRESTO 49-51 MG, Take 1 tablet by mouth twice daily, Disp: 60 tablet, Rfl: 3 .  ferrous sulfate 325 (65 FE) MG EC tablet, Take 1 tablet by mouth daily., Disp: , Rfl:  .  gabapentin (NEURONTIN) 400 MG capsule, Take 1 capsule by mouth daily., Disp: , Rfl:  .  metoprolol succinate (TOPROL-XL) 25 MG 24 hr tablet, Take 1 tablet (25 mg total) by mouth daily. Hold if systolic blood pressure (top blood pressure number) less than 100 mmHg or heart rate less than 60 bpm (pulse)., Disp: 30 tablet, Rfl: 5 .  mirtazapine (REMERON) 30 MG tablet, Take 1 tablet by mouth daily., Disp: , Rfl:  .  oxybutynin (DITROPAN) 5 MG tablet, Take 1 tablet by mouth daily., Disp: , Rfl:  .  QUEtiapine (SEROQUEL) 300 MG tablet, Take 1 tablet by mouth daily., Disp: , Rfl:  .  warfarin (COUMADIN) 3 MG tablet, Take 1 tablet (3 mg total) by mouth daily. As  Directed; Take 4.5 mg every Tues and Thurs and 3 mg all other days. Refer to most recent anticoagulation note for most updated directions., Disp: 100 tablet, Rfl: 2 Past Medical History:  Diagnosis Date  . Asthma   . Cardiomyopathy (HCC)   . CHF (congestive  heart failure) (HCC)   . Chronic kidney disease   . COPD (chronic obstructive pulmonary disease) (HCC)   . Coronary artery disease   . H/O heart valve replacement with mechanical valve   . Heart attack (HCC)   . Hyperlipidemia   . Hypertension   . Paroxysmal atrial fibrillation (HCC)   . Stroke [facility])     ASSESSMENT  Recent Results: The most recent result is correlated with 24 mg per week:  Lab Results  Component Value Date   INR 3.1 (A) 11/05/2019   INR 3.4 (A) 10/16/2019   INR 3.6 (A) 09/17/2019    Anticoagulation Dosing: Description   INR at goal. Continue taking 4.5 mg every Tues and Thurs and 3 mg all other  days. Recheck in 4 weeks       INR today: Therapeutic. INR continues to remain therapeutic. Pt reports to be back to her regular Vit K intake. Denies any complains of bleeding or bruising symptoms. Denies any other relevant changes in her diet, medications, or lifestyle.   PLAN Weekly dose was unchanged. Take 1.5 mg today and then continue current dose of 4.5 mg every Tues, Thurs and 3 mg all other days. Recheck INR in 3 weeks w/ OV  Patient Instructions  INR at goal. Continue taking 4.5 mg every Tues and Thurs and 3 mg all other days. Recheck in 4 weeks   Patient advised to contact clinic or seek medical attention if signs/symptoms of bleeding or thromboembolism occur.  Patient verbalized understanding by repeating back information and was advised to contact me if further medication-related questions arise.   Follow-up Return in about 4 weeks (around 12/03/2019).  Leonides Schanz, PharmD  15 minutes spent face-to-face with the patient during the encounter. 50% of time spent on education, including signs/sx bleeding and clotting, as well as food and drug interactions with warfarin. 50% of time was spent on fingerprick POC INR sample collection,processing, results determination, and documentation

## 2019-11-05 NOTE — Patient Instructions (Signed)
INR at goal. Continue taking 4.5 mg every Tues and Thurs and 3 mg all other days. Recheck in 4 weeks

## 2019-12-03 ENCOUNTER — Ambulatory Visit: Payer: Medicaid Other | Admitting: Pharmacist

## 2019-12-03 ENCOUNTER — Other Ambulatory Visit: Payer: Self-pay

## 2019-12-03 DIAGNOSIS — Z952 Presence of prosthetic heart valve: Secondary | ICD-10-CM

## 2019-12-03 DIAGNOSIS — Z5181 Encounter for therapeutic drug level monitoring: Secondary | ICD-10-CM

## 2019-12-03 DIAGNOSIS — I480 Paroxysmal atrial fibrillation: Secondary | ICD-10-CM

## 2019-12-03 LAB — POCT INR: INR: 3.6 — AB (ref 2.0–3.0)

## 2019-12-03 NOTE — Patient Instructions (Signed)
INR above goal. Continue taking 4.5 mg every Tues and Thurs and 3 mg all other days. Recheck in 3 weeks

## 2019-12-03 NOTE — Progress Notes (Signed)
Anticoagulation Management Joanna Martinez is a 59 y.o. female who reports to the clinic for monitoring of warfarin treatment.    Indication: s/p Saint Jude mechanical mitral valve replacement in March 2002   Duration: indefinite Supervising physician: Tessa Lerner  Anticoagulation Clinic Visit History:.  Hx of multiple previous strokes. Most recent incidence 3 or 4 years ago. Pt also on Plavix for history of coronary artery disease status post drug-eluting stent in August 2019. Noteded major DDI b/w warfarin and plavix of increased bleeding risk. Noted DDI b/w warfarin and allopurinol about increased anticoagulant effect as well. Pt denies any recent allopurinol dose changes.Pt confirmed to have a tan colored (3 mg) warfarin tablet at home. Pt started taking warfarin in the evening as previously discussed.     Patient does not report signs/symptoms of bleeding or thromboembolism.   Other recent changes: No changes in medications or lifestyle. Pt normally has 1 serving of collard greens/week and 2 serving of salad TIW.    Anticoagulation Episode Summary    Current INR goal:  2.5-3.5  TTR:  66.6 % (4.5 mo)  Next INR check:  12/24/2019  INR from last check:  3.6 (12/03/2019)  Weekly max warfarin dose:  24 mg  Target end date:  Indefinite  INR check location:    Preferred lab:    Send INR reminders to:     Indications   S/P mitral valve replacement [Z95.2] Atrial fibrillation (HCC) [I48.91] Monitoring for long-term anticoagulant use [Z51.81 Z79.01]       Comments:          No Known Allergies  Current Outpatient Medications:  .  atorvastatin (LIPITOR) 40 MG tablet, Take 1 tablet (40 mg total) by mouth at bedtime., Disp: 90 tablet, Rfl: 1 .  clopidogrel (PLAVIX) 75 MG tablet, Take 1 tablet (75 mg total) by mouth daily., Disp: 90 tablet, Rfl: 1 .  ENTRESTO 49-51 MG, Take 1 tablet by mouth twice daily, Disp: 60 tablet, Rfl: 3 .  ferrous sulfate 325 (65 FE) MG EC tablet, Take 1  tablet by mouth daily., Disp: , Rfl:  .  gabapentin (NEURONTIN) 100 MG capsule, Take 1 capsule by mouth 3 (three) times daily. , Disp: , Rfl:  .  hydrOXYzine (ATARAX/VISTARIL) 25 MG tablet, Take 25 mg by mouth at bedtime as needed for sleep., Disp: , Rfl:  .  metoprolol succinate (TOPROL-XL) 25 MG 24 hr tablet, Take 1 tablet (25 mg total) by mouth daily. Hold if systolic blood pressure (top blood pressure number) less than 100 mmHg or heart rate less than 60 bpm (pulse)., Disp: 30 tablet, Rfl: 5 .  QUEtiapine (SEROQUEL) 100 MG tablet, Take 1 tablet by mouth at bedtime. , Disp: , Rfl:  .  tiZANidine (ZANAFLEX) 2 MG tablet, Take 2 mg by mouth 2 (two) times daily., Disp: , Rfl:  .  traMADol (ULTRAM) 50 MG tablet, Take 50 mg by mouth 3 (three) times daily as needed., Disp: , Rfl:  .  warfarin (COUMADIN) 3 MG tablet, Take 1 tablet (3 mg total) by mouth daily. As  Directed; Take 4.5 mg every Tues and Thurs and 3 mg all other days. Refer to most recent anticoagulation note for most updated directions., Disp: 100 tablet, Rfl: 2 .  acetaminophen (TYLENOL) 650 MG CR tablet, Take 2 tablets by mouth every 8 (eight) hours as needed. (Patient not taking: Reported on 12/03/2019), Disp: , Rfl:  .  allopurinol (ZYLOPRIM) 100 MG tablet, Take 1 tablet by mouth daily. (Patient not  taking: Reported on 12/03/2019), Disp: , Rfl:  .  oxybutynin (DITROPAN) 5 MG tablet, Take 1 tablet by mouth daily. (Patient not taking: Reported on 12/03/2019), Disp: , Rfl:  Past Medical History:  Diagnosis Date  . Asthma   . Cardiomyopathy (HCC)   . CHF (congestive heart failure) (HCC)   . Chronic kidney disease   . COPD (chronic obstructive pulmonary disease) (HCC)   . Coronary artery disease   . H/O heart valve replacement with mechanical valve   . Heart attack (HCC)   . Hyperlipidemia   . Hypertension   . Paroxysmal atrial fibrillation (HCC)   . Stroke [facility])     ASSESSMENT  Recent Results: The most recent result is correlated  with 24 mg per week:  Lab Results  Component Value Date   INR 3.6 (A) 12/03/2019   INR 3.1 (A) 11/05/2019   INR 3.4 (A) 10/16/2019    Anticoagulation Dosing: Description   INR above goal. Continue taking 4.5 mg every Tues and Thurs and 3 mg all other days. Recheck in 3 weeks       INR today: Supratherapeutic. INR slightly above goal. Pt has been therapeutic on the current dose previously. Pt reports to maintain her regular Vit K intake. Denies any complains of bleeding or bruising symptoms. Denies any other relevant changes in her diet, medications, or lifestyle. Will continue current therapy and close monitoring. If INR remains elevated at next INR check, will decrease weekly dose.   PLAN Weekly dose was unchanged. Continue current dose of 4.5 mg every Tues, Thurs and 3 mg all other days. Recheck INR in 3 weeks  Patient Instructions  INR above goal. Continue taking 4.5 mg every Tues and Thurs and 3 mg all other days. Recheck in 3 weeks   Patient advised to contact clinic or seek medical attention if signs/symptoms of bleeding or thromboembolism occur.  Patient verbalized understanding by repeating back information and was advised to contact me if further medication-related questions arise.   Follow-up Return in about 3 weeks (around 12/24/2019).  Leonides Schanz, PharmD  15 minutes spent face-to-face with the patient during the encounter. 50% of time spent on education, including signs/sx bleeding and clotting, as well as food and drug interactions with warfarin. 50% of time was spent on fingerprick POC INR sample collection,processing, results determination, and documentation

## 2019-12-18 ENCOUNTER — Other Ambulatory Visit: Payer: Self-pay | Admitting: Pharmacist

## 2019-12-18 DIAGNOSIS — E782 Mixed hyperlipidemia: Secondary | ICD-10-CM

## 2019-12-18 DIAGNOSIS — I5022 Chronic systolic (congestive) heart failure: Secondary | ICD-10-CM

## 2019-12-18 DIAGNOSIS — I2510 Atherosclerotic heart disease of native coronary artery without angina pectoris: Secondary | ICD-10-CM

## 2019-12-18 DIAGNOSIS — Z8673 Personal history of transient ischemic attack (TIA), and cerebral infarction without residual deficits: Secondary | ICD-10-CM

## 2019-12-22 DIAGNOSIS — I059 Rheumatic mitral valve disease, unspecified: Secondary | ICD-10-CM | POA: Insufficient documentation

## 2019-12-22 DIAGNOSIS — E785 Hyperlipidemia, unspecified: Secondary | ICD-10-CM | POA: Insufficient documentation

## 2019-12-25 ENCOUNTER — Other Ambulatory Visit: Payer: Self-pay | Admitting: Pharmacist

## 2019-12-25 ENCOUNTER — Ambulatory Visit: Payer: Medicaid Other | Admitting: Pharmacist

## 2019-12-25 ENCOUNTER — Other Ambulatory Visit: Payer: Self-pay

## 2019-12-25 ENCOUNTER — Other Ambulatory Visit: Payer: Self-pay | Admitting: Cardiology

## 2019-12-25 DIAGNOSIS — I5022 Chronic systolic (congestive) heart failure: Secondary | ICD-10-CM

## 2019-12-25 DIAGNOSIS — I480 Paroxysmal atrial fibrillation: Secondary | ICD-10-CM

## 2019-12-25 DIAGNOSIS — Z5181 Encounter for therapeutic drug level monitoring: Secondary | ICD-10-CM

## 2019-12-25 DIAGNOSIS — Z952 Presence of prosthetic heart valve: Secondary | ICD-10-CM

## 2019-12-25 DIAGNOSIS — Z7901 Long term (current) use of anticoagulants: Secondary | ICD-10-CM

## 2019-12-25 LAB — POCT INR: INR: 2.9 (ref 2.0–3.0)

## 2019-12-25 MED ORDER — ENTRESTO 49-51 MG PO TABS: 1.0000 | ORAL_TABLET | Freq: Two times a day (BID) | ORAL | 3 refills | Status: DC

## 2019-12-25 NOTE — Progress Notes (Signed)
Anticoagulation Management Joanna Martinez is a 59 y.o. female who reports to the clinic for monitoring of warfarin treatment.    Indication: s/p Saint Jude mechanical mitral valve replacement in March 2002   Duration: indefinite Supervising physician: Tessa Lerner  Anticoagulation Clinic Visit History:.  Hx of multiple previous strokes. Most recent incidence 3 or 4 years ago. Pt also on Plavix for history of coronary artery disease status post drug-eluting stent in August 2019. Noteded major DDI b/w warfarin and plavix of increased bleeding risk. Noted DDI b/w warfarin and allopurinol about increased anticoagulant effect as well. Pt denies any recent allopurinol dose changes.Pt confirmed to have a tan colored (3 mg) warfarin tablet at home. Pt started taking warfarin in the evening as previously discussed.     Patient does not report signs/symptoms of bleeding or thromboembolism.   Other recent changes: No changes in medications or lifestyle. Pt normally has 1 serving of collard greens/week and 2 serving of salad TIW.   Updated med list together. Pt has a new PCP, updated on Epic. Pt interested in doing a pill pack since she states that she is having a hard time keeping up with her medications. Pt currently is being optimized for her pain, anxiety, and heart failure management. Enrolled pt in pill pack but informed pt that we should wait before she is stabilized on her chronic health conditions before continuing with her [facility] services.    Anticoagulation Episode Summary    Current INR goal:  2.5-3.5  TTR:  69.3 % (5.3 mo)  Next INR check:  01/11/2020  INR from last check:  2.9 (12/25/2019)  Weekly max warfarin dose:  24 mg  Target end date:  Indefinite  INR check location:    Preferred lab:    Send INR reminders to:     Indications   S/P mitral valve replacement [Z95.2] Atrial fibrillation (HCC) [I48.91] Monitoring for long-term anticoagulant use [Z51.81 Z79.01]        Comments:          No Known Allergies  Current Outpatient Medications:  .  albuterol (VENTOLIN HFA) 108 (90 Base) MCG/ACT inhaler, Proventil HFA 90 mcg/actuation aerosol inhaler  INHALE 1 PUFF BY MOUTH EVERY 4 TO 6 HOURS AS NEEDED, Disp: , Rfl:  .  atorvastatin (LIPITOR) 40 MG tablet, Take 1 tablet (40 mg total) by mouth at bedtime. (Patient taking differently: Take 80 mg by mouth at bedtime. ), Disp: 90 tablet, Rfl: 1 .  buPROPion (WELLBUTRIN SR) 150 MG 12 hr tablet, Take 1 tablet by mouth 2 (two) times daily. , Disp: , Rfl:  .  clopidogrel (PLAVIX) 75 MG tablet, Take 1 tablet (75 mg total) by mouth daily., Disp: 90 tablet, Rfl: 1 .  DULoxetine (CYMBALTA) 30 MG capsule, Take 30 mg by mouth daily., Disp: , Rfl:  .  ENTRESTO 49-51 MG, Take 1 tablet by mouth twice daily, Disp: 60 tablet, Rfl: 3 .  ferrous sulfate 325 (65 FE) MG EC tablet, Take 1 tablet by mouth daily., Disp: , Rfl:  .  gabapentin (NEURONTIN) 600 MG tablet, Take 1 capsule by mouth 3 (three) times daily. , Disp: , Rfl:  .  hydrOXYzine (ATARAX/VISTARIL) 25 MG tablet, Take 25 mg by mouth at bedtime as needed for anxiety. , Disp: , Rfl:  .  metoprolol succinate (TOPROL-XL) 25 MG 24 hr tablet, Take 1 tablet (25 mg total) by mouth daily. Hold if systolic blood pressure (top blood pressure number) less than 100 mmHg or  heart rate less than 60 bpm (pulse)., Disp: 30 tablet, Rfl: 5 .  oxyCODONE-acetaminophen (PERCOCET) 10-325 MG tablet, Take 1 tablet by mouth every 8 (eight) hours as needed., Disp: , Rfl:  .  QUEtiapine (SEROQUEL) 300 MG tablet, Take 1 tablet by mouth at bedtime., Disp: , Rfl:  .  tiZANidine (ZANAFLEX) 4 MG capsule, Take 4 mg by mouth 3 (three) times daily as needed. , Disp: , Rfl:  .  traMADol (ULTRAM) 50 MG tablet, Take 50 mg by mouth 3 (three) times daily as needed., Disp: , Rfl:  .  traZODone (DESYREL) 150 MG tablet, Take 150 mg by mouth at bedtime. , Disp: , Rfl:  .  Vitamin D, Ergocalciferol, (DRISDOL) 1.25 MG  (50000 UNIT) CAPS capsule, Take 50,000 Units by mouth once a week., Disp: , Rfl:  .  warfarin (COUMADIN) 3 MG tablet, Take 1 tablet (3 mg total) by mouth daily. As  Directed; Take 4.5 mg every Tues and Thurs and 3 mg all other days. Refer to most recent anticoagulation note for most updated directions., Disp: 100 tablet, Rfl: 2 .  acetaminophen (TYLENOL) 650 MG CR tablet, Take 2 tablets by mouth every 8 (eight) hours as needed. (Patient not taking: Reported on 12/03/2019), Disp: , Rfl:  .  allopurinol (ZYLOPRIM) 100 MG tablet, Take 1 tablet by mouth daily. (Patient not taking: Reported on 12/25/2019), Disp: , Rfl:  .  spironolactone (ALDACTONE) 25 MG tablet, Take 1 tablet by mouth. Three times a week and as needed (Patient not taking: Reported on 12/25/2019), Disp: , Rfl:  Past Medical History:  Diagnosis Date  . Asthma   . Cardiomyopathy (HCC)   . CHF (congestive heart failure) (HCC)   . Chronic kidney disease   . COPD (chronic obstructive pulmonary disease) (HCC)   . Coronary artery disease   . H/O heart valve replacement with mechanical valve   . Heart attack (HCC)   . Hyperlipidemia   . Hypertension   . Paroxysmal atrial fibrillation (HCC)   . Stroke [facility])     ASSESSMENT  Recent Results: The most recent result is correlated with 24 mg per week:  Lab Results  Component Value Date   INR 2.9 12/25/2019   INR 3.6 (A) 12/03/2019   INR 3.1 (A) 11/05/2019    Anticoagulation Dosing: Description   INR above goal. Continue taking 4.5 mg every Tues and Thurs and 3 mg all other days. Recheck in 3 weeks       INR today: Therapeutic. Pt has been therapeutic on the current dose . Pt reports to maintain her regular Vit K intake. Denies any complains of bleeding or bruising symptoms. Denies any other relevant changes in her diet, medications, or lifestyle. Will continue current therapy and close monitoring.    PLAN Weekly dose was unchanged. Continue current dose of 4.5 mg every Tues,  Thurs and 3 mg all other days. Recheck INR in 3 weeks  There are no Patient Instructions on file for this visit. Patient advised to contact clinic or seek medical attention if signs/symptoms of bleeding or thromboembolism occur.  Patient verbalized understanding by repeating back information and was advised to contact me if further medication-related questions arise.   Follow-up Return in about 4 weeks (around 01/22/2020).  Leonides Schanz, PharmD  15 minutes spent face-to-face with the patient during the encounter. 50% of time spent on education, including signs/sx bleeding and clotting, as well as food and drug interactions with warfarin. 50% of time was spent on  fingerprick POC INR sample collection,processing, results determination, and documentation

## 2019-12-25 NOTE — Addendum Note (Signed)
Addended by: Cassell Clement T on: 12/25/2019 03:59 PM   Modules accepted: Orders

## 2020-01-02 LAB — BASIC METABOLIC PANEL
BUN/Creatinine Ratio: 8 — ABNORMAL LOW (ref 12–28)
BUN: 7 mg/dL — ABNORMAL LOW (ref 8–27)
CO2: 21 mmol/L (ref 20–29)
Calcium: 9.1 mg/dL (ref 8.7–10.3)
Chloride: 105 mmol/L (ref 96–106)
Creatinine, Ser: 0.83 mg/dL (ref 0.57–1.00)
GFR calc Af Amer: 89 mL/min/{1.73_m2} (ref >59)
GFR calc non Af Amer: 77 mL/min/{1.73_m2} (ref >59)
Glucose: 137 mg/dL — ABNORMAL HIGH (ref 65–99)
Potassium: 3.9 mmol/L (ref 3.5–5.2)
Sodium: 141 mmol/L (ref 134–144)

## 2020-01-02 LAB — MAGNESIUM: Magnesium: 1.8 mg/dL (ref 1.6–2.3)

## 2020-01-02 LAB — PRO B NATRIURETIC PEPTIDE: NT-Pro BNP: 352 pg/mL — ABNORMAL HIGH (ref 0–287)

## 2020-01-04 ENCOUNTER — Other Ambulatory Visit: Payer: Self-pay | Admitting: Pharmacist

## 2020-01-04 DIAGNOSIS — I5022 Chronic systolic (congestive) heart failure: Secondary | ICD-10-CM

## 2020-01-04 NOTE — Telephone Encounter (Signed)
Per pt's pharmacy, last entresto 49/51 mg filled on 9/7 for 30 d/s. Pt still hasnt picked up her refill that was filled on 12/27/19.   Called and reviewed results with pt. Pt confirmed that she hasnt been able to pick up the refill due to transportation concerns. Pt stated that she will go pick it up later today and will start on entresto 49/51 mg BID starting today. Denies any complains of SOB, dyspnea, edema. Reviewed elevated NT-pro BNP and the need for better CHF management. Pt still interested in using [facility] in order to better coordinate her medication management related concerns. If pt is stable and controlled on Entresto 49/51 mg BID, will proceed with coordinating [facility] process. Profile already created and confirmed with [facility]. Reviewed need for repeat labs in 1 week with pt. Pt verbalized understanding.

## 2020-01-11 ENCOUNTER — Ambulatory Visit: Payer: Medicaid Other | Admitting: Cardiology

## 2020-01-12 ENCOUNTER — Ambulatory Visit: Payer: Medicaid Other | Admitting: Cardiology

## 2020-01-12 ENCOUNTER — Encounter: Payer: Self-pay | Admitting: Cardiology

## 2020-01-12 ENCOUNTER — Other Ambulatory Visit: Payer: Self-pay

## 2020-01-12 VITALS — BP 120/55 | HR 65 | Resp 16 | Ht 60.0 in | Wt 199.6 lb

## 2020-01-12 DIAGNOSIS — Z8679 Personal history of other diseases of the circulatory system: Secondary | ICD-10-CM

## 2020-01-12 DIAGNOSIS — I2510 Atherosclerotic heart disease of native coronary artery without angina pectoris: Secondary | ICD-10-CM

## 2020-01-12 DIAGNOSIS — Z7901 Long term (current) use of anticoagulants: Secondary | ICD-10-CM

## 2020-01-12 DIAGNOSIS — E66812 Obesity, class 2: Secondary | ICD-10-CM

## 2020-01-12 DIAGNOSIS — Z952 Presence of prosthetic heart valve: Secondary | ICD-10-CM

## 2020-01-12 DIAGNOSIS — Z5181 Encounter for therapeutic drug level monitoring: Secondary | ICD-10-CM

## 2020-01-12 DIAGNOSIS — I10 Essential (primary) hypertension: Secondary | ICD-10-CM

## 2020-01-12 DIAGNOSIS — Z955 Presence of coronary angioplasty implant and graft: Secondary | ICD-10-CM

## 2020-01-12 DIAGNOSIS — Z720 Tobacco use: Secondary | ICD-10-CM

## 2020-01-12 DIAGNOSIS — E782 Mixed hyperlipidemia: Secondary | ICD-10-CM

## 2020-01-12 DIAGNOSIS — Z8673 Personal history of transient ischemic attack (TIA), and cerebral infarction without residual deficits: Secondary | ICD-10-CM

## 2020-01-12 DIAGNOSIS — I480 Paroxysmal atrial fibrillation: Secondary | ICD-10-CM

## 2020-01-12 DIAGNOSIS — I5022 Chronic systolic (congestive) heart failure: Secondary | ICD-10-CM

## 2020-01-12 DIAGNOSIS — I255 Ischemic cardiomyopathy: Secondary | ICD-10-CM

## 2020-01-12 LAB — POCT INR: INR: 4.8 — AB (ref 2.0–3.0)

## 2020-01-12 MED ORDER — ATORVASTATIN CALCIUM 80 MG PO TABS: 80.0000 mg | ORAL_TABLET | Freq: Every day | ORAL | 1 refills | Status: DC

## 2020-01-12 NOTE — Progress Notes (Signed)
Joanna Martinez Date of Birth: May 13, 1960 MRN: [id number] Primary Care Provider:[place], Alfonso Ramus, [place] Former Cardiology Providers: Dr. Thayer Jew [phone] Primary Cardiologist: Rex Kras, DO, [place] (established care 07/02/2019)  Date: 01/12/2020 Last Office Visit: 10/16/2019  Chief Complaint  Patient presents with  . Congestive Heart Failure  . Follow-up    3 month    HPI  Joanna Martinez is a 59 y.o. 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   Patient was originally referred to the office to establish care given her history of coronary artery disease and status post mechanical mitral valve replacement at the request of her primary care physician.  During the initial visit patient was asymptomatic from a cardiovascular standpoint but noted that she had ran out of cardiac medications for approximately 6 to 12 months.  But was taking her Coumadin on a regular basis.  Based on prior records obtained patient has history of atherosclerotic coronary artery disease with prior PCI to LCX and SJM mechanical mitral valve replacement in 05/2000 at [facility] for rheumatic mitral stenosis on long term [place] with coumadin. Last heart cath was in November 2019 due to angina which demonstrated scattered moderate non-obstructive disease with 65% stenosis of the ostial RPDA and medical therapy was recommended.  Echocardiogram performed in May 2021  noted moderately reduced left-ventricular systolic function at 30 to 35%, moderately dilated left atrium, functional mitral mechanical valve prosthesis.    Since last visit patient states that she is doing well from a cardiovascular standpoint.  No hospitalizations or urgent care visits for cardiovascular symptoms.  She remains euvolemic and not in congestive heart failure.  She has lost 7 pounds since last office encounter.  She has decreased her smoking to no more than 5 cigarettes/day with hopes of complete cessation shortly before the end of the year.  At last office visit the goal was to uptitrate her Entresto dose; however, this did not occur until January 05, 2020.  Repeat blood work is still pending.  Patient states that the blood work has not been performed as she needs transportation to and from appointments.  She states that she is trying to increase her physical activity and change her lifestyle.  FUNCTIONAL STATUS: No exercise or daily routine.    ALLERGIES: No Known Allergies  MEDICATION LIST PRIOR TO VISIT: Current Meds  Medication Sig  . acetaminophen (TYLENOL) 650 MG CR tablet Take 2 tablets by mouth every 8 (eight) hours as needed.   Marland Kitchen allopurinol (ZYLOPRIM) 100 MG tablet Take 1 tablet by mouth daily.   Marland Kitchen buPROPion (WELLBUTRIN SR) 150 MG 12 hr tablet Take 1 tablet by mouth 2 (two) times daily.   . clopidogrel (PLAVIX) 75 MG tablet Take 1 tablet (75 mg total) by mouth daily.  . Diclofenac Sodium 3 % GEL Apply topically.  . DULoxetine (CYMBALTA) 30 MG capsule Take 30 mg by mouth daily.  . ferrous sulfate 325 (65 FE) MG EC tablet Take 1 tablet by mouth daily.  Marland Kitchen gabapentin (NEURONTIN) 600 MG tablet Take 1 capsule by  mouth 3 (three) times daily.   . metoprolol succinate (TOPROL-XL) 25 MG 24 hr tablet Take 1 tablet (25 mg total) by mouth daily. Hold if systolic blood pressure (top blood pressure number) less than 100 mmHg or heart rate less than 60 bpm (pulse).  Marland Kitchen  oxyCODONE-acetaminophen (PERCOCET) 10-325 MG tablet Take 1 tablet by mouth every 8 (eight) hours as needed.  Marland Kitchen QUEtiapine (SEROQUEL) 300 MG tablet Take 1 tablet by mouth at bedtime.  . sacubitril-valsartan (ENTRESTO) 49-51 MG Take 1 tablet by mouth 2 (two) times daily.  Marland Kitchen tiZANidine (ZANAFLEX) 4 MG capsule Take 4 mg by mouth 3 (three) times daily as needed.   . traMADol (ULTRAM) 50 MG tablet Take 50 mg by mouth 3 (three) times daily as needed.  . traZODone (DESYREL) 150 MG tablet Take 150 mg by mouth at bedtime.   . Vitamin D, Ergocalciferol, (DRISDOL) 1.25 MG (50000 UNIT) CAPS capsule Take 50,000 Units by mouth once a week.  . warfarin (COUMADIN) 3 MG tablet Take 1 tablet (3 mg total) by mouth daily. As  Directed; Take 4.5 mg every Tues and Thurs and 3 mg all other days. Refer to most recent anticoagulation note for most updated directions.     PAST MEDICAL HISTORY: Past Medical History:  Diagnosis Date  . Asthma   . Cardiomyopathy ([place])   . CHF (congestive heart failure) ([place])   . Chronic kidney disease   . COPD (chronic obstructive pulmonary disease) ([place])   . Coronary artery disease   . H/O heart valve replacement with mechanical valve   . Heart attack ([place])   . Hyperlipidemia   . Hypertension   . Paroxysmal atrial fibrillation (HCC)   . Stroke [facility])     PAST SURGICAL HISTORY: Past Surgical History:  Procedure Laterality Date  . CARDIAC SURGERY    . CORONARY ANGIOPLASTY WITH STENT PLACEMENT    . MITRAL VALVE SURGERY      FAMILY HISTORY: The patient family history includes Diabetes in her brother, brother, and sister; HIV/AIDS in her father and mother; Hyperlipidemia in her brother, brother, and sister; Hypertension in her brother, brother, and sister.  SOCIAL HISTORY:  The patient  reports that she has been smoking cigarettes. She has been smoking about 1.00 pack per day. She has never used smokeless tobacco. She reports previous alcohol use. She reports that she does not  use drugs.  REVIEW OF SYSTEMS: Review of Systems  Constitutional: Negative for chills and fever.  HENT: Negative for hoarse voice and nosebleeds.   Eyes: Negative for discharge, double vision and pain.  Cardiovascular: Negative for chest pain, claudication, dyspnea on exertion, leg swelling, near-syncope, orthopnea, palpitations, paroxysmal nocturnal dyspnea and syncope.  Respiratory: Negative for hemoptysis and shortness of breath.   Musculoskeletal: Negative for muscle cramps and myalgias.  Gastrointestinal: Negative for abdominal pain, constipation, diarrhea, hematemesis, hematochezia, melena, nausea and vomiting.  Neurological: Negative for dizziness and light-headedness.    PHYSICAL EXAM: Vitals with BMI 01/12/2020 10/16/2019 09/17/2019  Height 5' 0"  5' 0"  5' 0"   Weight 199 lbs 10 oz 206 lbs 204 lbs  BMI 38.98 10.62 69.48  Systolic 546 270 350  Diastolic 55 62 70  Pulse 65 84 54   CONSTITUTIONAL: Well-developed and well-nourished. No acute distress.  SKIN: Skin is warm and dry. No rash noted. No cyanosis. No pallor. No jaundice HEAD: Normocephalic and atraumatic.  EYES: No scleral icterus MOUTH/THROAT: Moist oral membranes.  NECK: No JVD present. No thyromegaly noted. No carotid bruits  LYMPHATIC: No visible cervical adenopathy.  CHEST Normal respiratory effort. No intercostal retractions  LUNGS: Clear to auscultation bilaterally. No stridor. No wheezes. No rales.  CARDIOVASCULAR: Regular rate and rhythm, positive T2-W5, mechanical click heard at the apex, no murmurs rubs or gallops appreciated. ABDOMINAL: Obese, soft, nontender, nondistended, positive bowel sounds all 4 quadrants. No apparent ascites.  EXTREMITIES: No peripheral edema  HEMATOLOGIC: No significant bruising NEUROLOGIC: Oriented to person, place, and time. Nonfocal. Normal muscle tone.  PSYCHIATRIC: Normal mood and affect. Normal behavior. Cooperative   CARDIAC DATABASE: Status post SJM mechanical mitral  valve placement in March 2002 at [facility] due to rheumatic mitral stenosis.  EKG: 07/02/2019: Sinus rhythm, 94 bpm, normal axis, incomplete right bundle branch block, left atrial enlargement, nonspecific T wave abnormality.   Echocardiogram: 04/13/2019 outside records: LVEF 80-99%, grade 1 diastolic impairment, moderately global hypokinesis.  Mechanical mitral valve present.  Valve function appears normal.  No evidence of mitral valve stenosis or regurgitation.  Mitral valve pressure halftime 38 ms.  07/10/2019: Moderately depressed LV systolic function with visual EF 30-35%. Left ventricle cavity is normal in size. Normal global wall motion. Indeterminate diastolic filling pattern. Calculated EF 35%. Left atrial cavity is moderately dilated at 4.5 cm. Mechanical mitral valve.  Trace mitral regurgitation, withing physiologic limits.  Normal mitral valve leaflet mobility. Peak PR of 4.6 and mean PG of 4 mm Hg. Trace tricuspid regurgitation. No evidence of pulmonary hypertension.   Stress Testing: We will obtain outside records.  Heart Catheterization: November 2019 per report: Proximal LAD 20%. LCx stent patent.  Discrete 20% narrowing noted distal to the stent.  A separate 30% stenosis was noted in the distal LCx. RCA: Diffuse 20-30% narrowing in the proximal/mid/distal segments. Posterior atrioventricular branch 50 to 60% stenosis in its distal segment prior to the origin of the most distal posterolateral branch. Right PDA smooth tubular lesions with 60 to 70% luminal narrowing.    LABORATORY DATA: No flowsheet data found.  CMP Latest Ref Rng & Units 01/01/2020 08/24/2019  Glucose 65 - 99 mg/dL 137(H) 130(H)  BUN 8 - 27 mg/dL 7(L) 13  Creatinine 0.57 - 1.00 mg/dL 0.83 0.96  Sodium 134 - 144 mmol/L 141 138  Potassium 3.5 - 5.2 mmol/L 3.9 4.7  Chloride 96 - 106 mmol/L 105 108(H)  CO2 20 - 29 mmol/L 21 18(L)  Calcium 8.7 - 10.3 mg/dL 9.1 9.3    Lipid Panel  No results found  for: CHOL, TRIG, HDL, CHOLHDL, VLDL, LDLCALC, LDLDIRECT, LABVLDL  No results found for: HGBA1C No components found for: NTPROBNP No results found for: TSH  BMP Recent Labs    08/24/19 1139 01/01/20 1130  NA 138 141  K 4.7 3.9  CL 108* 105  CO2 18* 21  GLUCOSE 130* 137*  BUN 13 7*  CREATININE 0.96 0.83  CALCIUM 9.3 9.1  GFRNONAA 64 77  GFRAA 74 89    CBC No results for input(s): WBC, RBC, HGB, HCT, PLT, MCV, MCH, MCHC, RDW, LYMPHSABS, MONOABS, EOSABS, BASOSABS in the last 168 hours.  Invalid input(s): NEUTRABS  HEMOGLOBIN A1C No results found for: HGBA1C, MPG  Cardiac Panel (last 3 results) No results for input(s): CKTOTAL, CKMB, TROPONINI, RELINDX in the last 8760 hours. No results for input(s): TROPIPOC in the last 8760 hours.  BNP (last 3 results) Recent Labs    08/24/19 1139 01/01/20 1129  PROBNP 109 352*    TSH No results for input(s): TSH in the last 8760 hours.  CHOLESTEROL No  results for input(s): CHOL in the last 8760 hours.  Hepatic Function Panel No results for input(s): PROT, ALBUMIN, AST, ALT, ALKPHOS, BILITOT, BILIDIR, IBILI in the last 8760 hours.   External Labs: Collected: 06/10/2019 Creatinine 1.06 mg/dL. eGFR: 66 mL/min per 1.73 m Lipid profile: Total cholesterol 304, triglycerides 310, HDL 53, LDL 190 Hemoglobin A1c: 6.3 TSH: 1.26 and T4 0.68 (free)  IMPRESSION:    ICD-10-CM   1. Chronic HFrEF (heart failure with reduced ejection fraction) (HCC)  I50.22 PCV ECHOCARDIOGRAM COMPLETE  2. Hx of mitral valve replacement with mechanical valve  Z95.2   3. S/P mitral valve replacement  Z95.2   4. Hx of rheumatic fever  Z86.79   5. Paroxysmal atrial fibrillation (HCC)  I48.0   6. Monitoring for long-term anticoagulant use  Z51.81    Z79.01   7. Atherosclerosis of native coronary artery of native heart without angina pectoris  I25.10   8. History of coronary angioplasty with insertion of stent  Z95.5   9. Ischemic cardiomyopathy  I25.5  PCV ECHOCARDIOGRAM COMPLETE  10. History of stroke  Z86.73   11. Benign hypertension  I10   12. Mixed hyperlipidemia  E78.2 atorvastatin (LIPITOR) 80 MG tablet  13. Tobacco use  Z72.0   14. Class 2 severe obesity due to excess calories with serious comorbidity and body mass index (BMI) of 38.0 to 38.9 in adult [facility])  E66.01    Z68.38      RECOMMENDATIONS: Joanna Martinez is a 59 y.o. 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 for rheumatic mitral stenosis, on oral anticoagulation, hypertension, tobacco smoker, several small strokes, paroxysmal atrial fibrillation (outside records from [facility]), HFrEF, ischemic cardiomyopathy, postmenopausal female, advanced age, obesity.  Atherosclerotic coronary artery disease with prior PCI without angina:  Continue to uptitrate guideline directed medical therapy and to reevaluate LVEF prior to the next office visit.  At this time patient stated that she did not want to add more medications as that she is tired of taking so many medications.  I reviewed her medications with her during this encounter.  She is only on 5 cardiac medications at approximately 9 noncardiac medications.  I have asked her to see her PCP and psychiatry to see if any changes can be made.  Chronic HFrEF:  Clinically patient is euvolemic.  Patient has lost 7 pounds since last visit.  Entresto started 08/13/2019. Uptitrated Entresto 49/84m p.o. twice daily  She did not check her labs after going up on Entresto.  She will check it later this week.  Continue Toprol-XL.  Continue Plavix.  Continue statin therapy.  Recommend daily weight check, strict I/O's  Fluid restriction to <2L per day, Na r K estriction < 2g per day  Currently in  principle care management for HF management.  Ischemic cardiomyopathy: See above  Atrial fibrillation, paroxysmal: . Rate control: metoprolol . Rhythm control: N/A . Thromboembolic prophylaxis: Currently on coumadin goal INR 2.5 to 3.5. Today's INR is 4.8  . CHA2DS2-VASc SCORE is 6 which correlates to 9.8 % risk of stroke per year. . Patient does not endorse any evidence of bleeding.  Long--term oral anticoagulation:   Indication: Paroxysmal atrial fibrillation and mechanical mitral valve.  Currently on Coumadin.  Patient does not endorse any evidence of bleeding.  INR at  today's office 4.8.  Status post [facility] Jude mechanical mitral valve secondary to rheumatic mitral valve stenosis in  March 2002:  Continue coumadin w/ goal INR 2.5-3.5  Active tobacco smoker:   Tobacco cessation counseling: Currently smoking 5 cigarettes per day   Patient was informed of the dangers of tobacco abuse including stroke, cancer, and MI, as well as benefits of tobacco cessation. Patient is willing to quit at this time. Approximately 8 mins were spent counseling patient cessation techniques. We discussed various methods to help quit smoking, including deciding on a date to quit, joining a support group, pharmacological agents- nicotine gum/patch/lozenges, chantix.   I will reassess her progress at the next follow-up visit  Hyperlipidemia:  Currently managed by primary team.  Continue Lipitor 40 mg p.o. nightly.  Patient does not endorse any myalgias.  Refill provided.  FINAL MEDICATION LIST END OF ENCOUNTER: Meds ordered this encounter  Medications  . atorvastatin (LIPITOR) 80 MG tablet    Sig: Take 1 tablet (80 mg total) by mouth at bedtime.    Dispense:  90 tablet    Refill:  1     Current Outpatient Medications:  .  acetaminophen (TYLENOL) 650 MG CR tablet, Take 2 tablets by mouth every 8 (eight) hours as needed. , Disp: , Rfl:  .  allopurinol (ZYLOPRIM) 100 MG tablet, Take 1 tablet  by mouth daily. , Disp: , Rfl:  .  buPROPion (WELLBUTRIN SR) 150 MG 12 hr tablet, Take 1 tablet by mouth 2 (two) times daily. , Disp: , Rfl:  .  clopidogrel (PLAVIX) 75 MG tablet, Take 1 tablet (75 mg total) by mouth daily., Disp: 90 tablet, Rfl: 1 .  Diclofenac Sodium 3 % GEL, Apply topically., Disp: , Rfl:  .  DULoxetine (CYMBALTA) 30 MG capsule, Take 30 mg by mouth daily., Disp: , Rfl:  .  ferrous sulfate 325 (65 FE) MG EC tablet, Take 1 tablet by mouth daily., Disp: , Rfl:  .  gabapentin (NEURONTIN) 600 MG tablet, Take 1 capsule by mouth 3 (three) times daily. , Disp: , Rfl:  .  metoprolol succinate (TOPROL-XL) 25 MG 24 hr tablet, Take 1 tablet (25 mg total) by mouth daily. Hold if systolic blood pressure (top blood pressure number) less than 100 mmHg or heart rate less than 60 bpm (pulse)., Disp: 30 tablet, Rfl: 5 .  oxyCODONE-acetaminophen (PERCOCET) 10-325 MG tablet, Take 1 tablet by mouth every 8 (eight) hours as needed., Disp: , Rfl:  .  QUEtiapine (SEROQUEL) 300 MG tablet, Take 1 tablet by mouth at bedtime., Disp: , Rfl:  .  sacubitril-valsartan (ENTRESTO) 49-51 MG, Take 1 tablet by mouth 2 (two) times daily., Disp: 180 tablet, Rfl: 3 .  tiZANidine (ZANAFLEX) 4 MG capsule, Take 4 mg by mouth 3 (three) times daily as needed. , Disp: , Rfl:  .  traMADol (ULTRAM) 50 MG tablet, Take 50 mg by mouth 3 (three) times daily as needed., Disp: , Rfl:  .  traZODone (DESYREL) 150 MG tablet, Take 150 mg by mouth at bedtime. , Disp: , Rfl:  .  Vitamin D, Ergocalciferol, (DRISDOL) 1.25 MG (50000 UNIT) CAPS capsule, Take 50,000 Units by mouth once a week., Disp: , Rfl:  .  warfarin (COUMADIN) 3 MG tablet, Take 1 tablet (3 mg total) by mouth daily. As  Directed; Take 4.5 mg every Tues and Thurs and 3 mg all other days. Refer to most recent anticoagulation note for most updated directions., Disp: 100 tablet, Rfl: 2 .  atorvastatin (LIPITOR) 80 MG tablet, Take 1 tablet (80 mg total) by mouth at bedtime., Disp:  90 tablet, Rfl: 1  Orders Placed This Encounter  Procedures  . POCT INR  . PCV ECHOCARDIOGRAM COMPLETE    There are no Patient Instructions on file for this visit.   --Continue cardiac medications as reconciled in final medication list. --Return in about 6 months (around 07/11/2020) for heart failure management, mechanical mitral valve. . Or sooner if needed. --Continue follow-up with your primary care physician regarding the management of your other chronic comorbid conditions.  Patient's questions and concerns were addressed to her satisfaction. She voices understanding of the instructions provided during this encounter.   This note was created using a voice recognition software as a result there may be grammatical errors inadvertently enclosed that do not reflect the nature of this encounter. Every attempt is made to correct such errors.  Total Time Spent: 40 minute.  Reviewing disease management, reconciling medications, INR check, refill of medications, and coordination of care.  Rex Kras, [place], [facility]  Pager: [phone] Office: [phone]

## 2020-01-20 LAB — BASIC METABOLIC PANEL
BUN/Creatinine Ratio: 5 — ABNORMAL LOW (ref 12–28)
BUN: 5 mg/dL — ABNORMAL LOW (ref 8–27)
CO2: 23 mmol/L (ref 20–29)
Calcium: 9.8 mg/dL (ref 8.7–10.3)
Chloride: 104 mmol/L (ref 96–106)
Creatinine, Ser: 1.07 mg/dL — ABNORMAL HIGH (ref 0.57–1.00)
GFR calc Af Amer: 65 mL/min/{1.73_m2} (ref >59)
GFR calc non Af Amer: 57 mL/min/{1.73_m2} — ABNORMAL LOW (ref >59)
Glucose: 106 mg/dL — ABNORMAL HIGH (ref 65–99)
Potassium: 4.3 mmol/L (ref 3.5–5.2)
Sodium: 142 mmol/L (ref 134–144)

## 2020-01-20 LAB — PRO B NATRIURETIC PEPTIDE: NT-Pro BNP: 360 pg/mL — ABNORMAL HIGH (ref 0–287)

## 2020-01-20 LAB — MAGNESIUM: Magnesium: 1.6 mg/dL (ref 1.6–2.3)

## 2020-01-27 NOTE — Progress Notes (Addendum)
Called and reviewed lab results with pt. Ntpro-BNP remains elevated and slightly worsening of renal function. Confirmed that pt is still taking Entresto 49/51 mg 1 tab BID and metoprolol 25 mg daily. Pt noted to have just restarted taking her Entresto on 01/06/20. Pt missed ~1 month since missing to refill it through her pharmacy. Denies any recent NSAIDs or other nephrotoxic medication use. Pt reports that she hasnt been drinking enough fluids recently and will try to drink ~1.5-2 L/day. Requested pt to schedule her transport for 1 week f/u to get repeat labs. If renal function and BNP readings improve, reviewed possibly transitioning her cardiac medications to [facility] pill pack. Otherwise discussed needing to decrease her Entresto dose to prevent further renal injury. Pt stated that he has been struggling with keeping up with her medication management and needs further assistance with simplifying her medication regimen. Pt highly interested in assistance with a pill pack service to help make sure she remains adherent and compliant to her prescribed medications.. Will follow up with pt after her 1 week labs.

## 2020-01-27 NOTE — Addendum Note (Signed)
Addended by: Durward Mallard on: 01/27/2020 12:52 PM   Modules accepted: Orders

## 2020-01-27 NOTE — Addendum Note (Signed)
Addended by: Cassell Clement T on: 01/27/2020 10:22 AM   Modules accepted: Orders

## 2020-02-01 ENCOUNTER — Other Ambulatory Visit: Payer: Self-pay

## 2020-02-01 ENCOUNTER — Ambulatory Visit: Payer: Medicaid Other | Admitting: Pharmacist

## 2020-02-01 ENCOUNTER — Other Ambulatory Visit: Payer: Medicaid Other

## 2020-02-01 DIAGNOSIS — Z952 Presence of prosthetic heart valve: Secondary | ICD-10-CM

## 2020-02-01 DIAGNOSIS — Z7901 Long term (current) use of anticoagulants: Secondary | ICD-10-CM

## 2020-02-01 DIAGNOSIS — I480 Paroxysmal atrial fibrillation: Secondary | ICD-10-CM

## 2020-02-01 DIAGNOSIS — Z5181 Encounter for therapeutic drug level monitoring: Secondary | ICD-10-CM

## 2020-02-01 LAB — POCT INR: INR: 1.8 — AB (ref 2.0–3.0)

## 2020-02-01 NOTE — Progress Notes (Signed)
Anticoagulation Management Joanna Martinez is a 59 y.o. female who reports to the clinic for monitoring of warfarin treatment.    Indication: s/p Saint Jude mechanical mitral valve replacement in March 2002   Duration: indefinite Supervising physician: Tessa Lerner  Anticoagulation Clinic Visit History:.  Hx of multiple previous strokes. Most recent incidence 3 or 4 years ago. Pt also on Plavix for history of coronary artery disease status post drug-eluting stent in August 2019. Noteded major DDI b/w warfarin and plavix of increased bleeding risk. Noted DDI b/w warfarin and allopurinol about increased anticoagulant effect as well. Pt denies any recent allopurinol dose changes.Pt confirmed to have a tan colored (3 mg) warfarin tablet at home. Pt started taking warfarin in the evening as previously discussed.     Patient does not report signs/symptoms of bleeding or thromboembolism.   Other recent changes: No changes in medications or lifestyle. Pt normally has 1 serving of collard greens/week and 2 serving of salad TIW.   Pt reports that she had several servings of collard greens over the past several days. Pt reports that she took 3 mg last Thursday instead of 4.5 mg on accident.   Anticoagulation Episode Summary    Current INR goal:  2.5-3.5  TTR:  62.2 % (6.5 mo)  Next INR check:  02/15/2020  INR from last check:  1.8 (02/01/2020)  Weekly max warfarin dose:  24 mg  Target end date:  Indefinite  INR check location:    Preferred lab:    Send INR reminders to:     Indications   S/P mitral valve replacement [Z95.2] Atrial fibrillation (HCC) [I48.91] Monitoring for long-term anticoagulant use [Z51.81 Z79.01]       Comments:         No Known Allergies  Current Outpatient Medications:  .  acetaminophen (TYLENOL) 650 MG CR tablet, Take 2 tablets by mouth every 8 (eight) hours as needed. , Disp: , Rfl:  .  allopurinol (ZYLOPRIM) 100 MG tablet, Take 1 tablet by mouth daily. , Disp: ,  Rfl:  .  atorvastatin (LIPITOR) 80 MG tablet, Take 1 tablet (80 mg total) by mouth at bedtime., Disp: 90 tablet, Rfl: 1 .  buPROPion (WELLBUTRIN SR) 150 MG 12 hr tablet, Take 1 tablet by mouth 2 (two) times daily. , Disp: , Rfl:  .  clopidogrel (PLAVIX) 75 MG tablet, Take 1 tablet (75 mg total) by mouth daily., Disp: 90 tablet, Rfl: 1 .  Diclofenac Sodium 3 % GEL, Apply topically., Disp: , Rfl:  .  DULoxetine (CYMBALTA) 30 MG capsule, Take 30 mg by mouth daily., Disp: , Rfl:  .  ferrous sulfate 325 (65 FE) MG EC tablet, Take 1 tablet by mouth daily., Disp: , Rfl:  .  gabapentin (NEURONTIN) 600 MG tablet, Take 1 capsule by mouth 3 (three) times daily. , Disp: , Rfl:  .  metoprolol succinate (TOPROL-XL) 25 MG 24 hr tablet, Take 1 tablet (25 mg total) by mouth daily. Hold if systolic blood pressure (top blood pressure number) less than 100 mmHg or heart rate less than 60 bpm (pulse)., Disp: 30 tablet, Rfl: 5 .  oxyCODONE-acetaminophen (PERCOCET) 10-325 MG tablet, Take 1 tablet by mouth every 8 (eight) hours as needed., Disp: , Rfl:  .  QUEtiapine (SEROQUEL) 300 MG tablet, Take 1 tablet by mouth at bedtime., Disp: , Rfl:  .  sacubitril-valsartan (ENTRESTO) 49-51 MG, Take 1 tablet by mouth 2 (two) times daily., Disp: 180 tablet, Rfl: 3 .  tiZANidine (ZANAFLEX) 4  MG capsule, Take 4 mg by mouth 3 (three) times daily as needed. , Disp: , Rfl:  .  traMADol (ULTRAM) 50 MG tablet, Take 50 mg by mouth 3 (three) times daily as needed., Disp: , Rfl:  .  traZODone (DESYREL) 150 MG tablet, Take 150 mg by mouth at bedtime. , Disp: , Rfl:  .  Vitamin D, Ergocalciferol, (DRISDOL) 1.25 MG (50000 UNIT) CAPS capsule, Take 50,000 Units by mouth once a week., Disp: , Rfl:  .  warfarin (COUMADIN) 3 MG tablet, Take 1 tablet (3 mg total) by mouth daily. As  Directed; Take 4.5 mg every Tues and Thurs and 3 mg all other days. Refer to most recent anticoagulation note for most updated directions., Disp: 100 tablet, Rfl: 2 Past  Medical History:  Diagnosis Date  . Asthma   . Cardiomyopathy (HCC)   . CHF (congestive heart failure) (HCC)   . Chronic kidney disease   . COPD (chronic obstructive pulmonary disease) (HCC)   . Coronary artery disease   . H/O heart valve replacement with mechanical valve   . Heart attack (HCC)   . Hyperlipidemia   . Hypertension   . Paroxysmal atrial fibrillation (HCC)   . Stroke [facility])     ASSESSMENT  Recent Results: The most recent result is correlated with 24 mg per week:  Lab Results  Component Value Date   INR 1.8 (A) 02/01/2020   INR 4.8 (A) 01/12/2020   INR 2.9 12/25/2019    Anticoagulation Dosing: Description   INR below goal. Take 4.5 mg today and then continue taking 4.5 mg every Tues and Thurs and 3 mg all other days. Recheck in 2 weeks       INR today: Therapeutic. Pt with recent labile INR. Recent increased Vit K intake and decreased weekly dose likely playing a role. Pt was previously stable on current dose. Pt planning on returning to her baseline Vit K intake. Pt agreeable to continue remaining adherent to her prescribed dose and not to take any extra dose the following day if she missed a dose. Denies any complains of bleeding or bruising symptoms. Denies any other relevant changes in her diet, medications, or lifestyle. Will continue boost today and continue previous maintenance dose. Will continue close monitoring.   PLAN Weekly dose was unchanged by 0% to 24 mg/week. Take 4.5 mg today and then continue current dose of 4.5 mg every Tues, Thurs and 3 mg all other days. Recheck INR in 2 weeks  Patient Instructions  INR below goal. Take 4.5 mg today and then continue taking 4.5 mg every Tues and Thurs and 3 mg all other days. Recheck in 2 weeks   Patient advised to contact clinic or seek medical attention if signs/symptoms of bleeding or thromboembolism occur.  Patient verbalized understanding by repeating back information and was advised to contact me if  further medication-related questions arise.   Follow-up Return in about 2 weeks (around 02/15/2020).  Leonides Schanz, PharmD  15 minutes spent face-to-face with the patient during the encounter. 50% of time spent on education, including signs/sx bleeding and clotting, as well as food and drug interactions with warfarin. 50% of time was spent on fingerprick POC INR sample collection,processing, results determination, and documentation

## 2020-02-01 NOTE — Patient Instructions (Signed)
INR below goal. Take 4.5 mg today and then continue taking 4.5 mg every Tues and Thurs and 3 mg all other days. Recheck in 2 weeks

## 2020-02-06 LAB — PRO B NATRIURETIC PEPTIDE: NT-Pro BNP: 200 pg/mL (ref 0–287)

## 2020-02-11 LAB — BASIC METABOLIC PANEL
BUN/Creatinine Ratio: 8 — ABNORMAL LOW (ref 12–28)
BUN: 8 mg/dL (ref 8–27)
CO2: 19 mmol/L — ABNORMAL LOW (ref 20–29)
Calcium: 9 mg/dL (ref 8.7–10.3)
Chloride: 106 mmol/L (ref 96–106)
Creatinine, Ser: 0.99 mg/dL (ref 0.57–1.00)
GFR calc Af Amer: 72 mL/min/{1.73_m2} (ref >59)
GFR calc non Af Amer: 62 mL/min/{1.73_m2} (ref >59)
Glucose: 109 mg/dL — ABNORMAL HIGH (ref 65–99)
Potassium: 4.3 mmol/L (ref 3.5–5.2)
Sodium: 140 mmol/L (ref 134–144)

## 2020-02-11 LAB — SPECIMEN STATUS REPORT

## 2020-02-11 LAB — MAGNESIUM: Magnesium: 1.9 mg/dL (ref 1.6–2.3)

## 2020-02-15 ENCOUNTER — Ambulatory Visit: Payer: Medicaid Other | Admitting: Pharmacist

## 2020-02-15 ENCOUNTER — Other Ambulatory Visit: Payer: Self-pay

## 2020-02-15 DIAGNOSIS — I4821 Permanent atrial fibrillation: Secondary | ICD-10-CM

## 2020-02-15 DIAGNOSIS — Z5181 Encounter for therapeutic drug level monitoring: Secondary | ICD-10-CM

## 2020-02-15 DIAGNOSIS — Z952 Presence of prosthetic heart valve: Secondary | ICD-10-CM

## 2020-02-15 LAB — POCT INR: INR: 2.3 (ref 2.0–3.0)

## 2020-02-15 NOTE — Patient Instructions (Signed)
INR below goal. Increase weekly dose to 4.5 mg every Tue, Thu, Sat; 3 mg all other days. Recheck INR in 3 weeks

## 2020-02-15 NOTE — Progress Notes (Signed)
Anticoagulation Management Joanna Martinez is a 59 y.o. female who reports to the clinic for monitoring of warfarin treatment.    Indication: s/p Saint Jude mechanical mitral valve replacement in March 2002   Duration: indefinite Supervising physician: Tessa Lerner  Anticoagulation Clinic Visit History:.  Hx of multiple previous strokes. Most recent incidence 3 or 4 years ago. Pt also on Plavix for history of coronary artery disease status post drug-eluting stent in August 2019. Noteded major DDI b/w warfarin and plavix of increased bleeding risk. Noted DDI b/w warfarin and allopurinol about increased anticoagulant effect as well. Pt denies any recent allopurinol dose changes.Pt confirmed to have a tan colored (3 mg) warfarin tablet at home. Pt started taking warfarin in the evening as previously discussed.     Patient does not report signs/symptoms of bleeding or thromboembolism.   Other recent changes: No changes in medications or lifestyle. Pt normally has 1 serving of collard greens/week and 2 serving of salad TIW.   Pt states that she had 2 servings of collard greens over the past week and increased intake of green beans. Planing on staying consistent with 1-2 servings of high Vit K intake/week. Pt interested in enrolling in the pill pack services. Will refer pt to [facility] pill pack.   Anticoagulation Episode Summary    Current INR goal:  2.5-3.5  TTR:  58.1 % (7 mo)  Next INR check:  03/08/2020  INR from last check:  2.3 (02/15/2020)  Weekly max warfarin dose:  24 mg  Target end date:  Indefinite  INR check location:    Preferred lab:    Send INR reminders to:     Indications   S/P mitral valve replacement [Z95.2] Atrial fibrillation (HCC) [I48.91] Monitoring for long-term anticoagulant use [Z51.81 Z79.01]       Comments:          No Known Allergies  Current Outpatient Medications:  .  allopurinol (ZYLOPRIM) 100 MG tablet, Take 1 tablet by mouth daily. , Disp: , Rfl:  .   atorvastatin (LIPITOR) 80 MG tablet, Take 1 tablet (80 mg total) by mouth at bedtime., Disp: 90 tablet, Rfl: 1 .  buPROPion (WELLBUTRIN SR) 150 MG 12 hr tablet, Take 1 tablet by mouth 2 (two) times daily. , Disp: , Rfl:  .  clopidogrel (PLAVIX) 75 MG tablet, Take 1 tablet (75 mg total) by mouth daily., Disp: 90 tablet, Rfl: 1 .  Diclofenac Sodium 3 % GEL, Apply topically., Disp: , Rfl:  .  DULoxetine (CYMBALTA) 30 MG capsule, Take 30 mg by mouth daily., Disp: , Rfl:  .  ferrous sulfate 325 (65 FE) MG EC tablet, Take 1 tablet by mouth daily., Disp: , Rfl:  .  gabapentin (NEURONTIN) 600 MG tablet, Take 1 capsule by mouth 3 (three) times daily. , Disp: , Rfl:  .  metoprolol succinate (TOPROL-XL) 25 MG 24 hr tablet, Take 1 tablet (25 mg total) by mouth daily. Hold if systolic blood pressure (top blood pressure number) less than 100 mmHg or heart rate less than 60 bpm (pulse)., Disp: 30 tablet, Rfl: 5 .  oxyCODONE-acetaminophen (PERCOCET/ROXICET) 5-325 MG tablet, Take 1 tablet by mouth 3 (three) times daily as needed., Disp: , Rfl:  .  QUEtiapine (SEROQUEL) 300 MG tablet, Take 1 tablet by mouth at bedtime., Disp: , Rfl:  .  sacubitril-valsartan (ENTRESTO) 49-51 MG, Take 1 tablet by mouth 2 (two) times daily., Disp: 180 tablet, Rfl: 3 .  tiZANidine (ZANAFLEX) 4 MG capsule, Take 4 mg  by mouth 3 (three) times daily as needed. , Disp: , Rfl:  .  traZODone (DESYREL) 150 MG tablet, Take 150 mg by mouth at bedtime. , Disp: , Rfl:  .  Vitamin D, Ergocalciferol, (DRISDOL) 1.25 MG (50000 UNIT) CAPS capsule, Take 50,000 Units by mouth once a week., Disp: , Rfl:  .  warfarin (COUMADIN) 3 MG tablet, Take 1 tablet (3 mg total) by mouth daily. As  Directed; Take 4.5 mg every Tues and Thurs and 3 mg all other days. Refer to most recent anticoagulation note for most updated directions., Disp: 100 tablet, Rfl: 2 .  zolpidem (AMBIEN) 5 MG tablet, Take 5 mg by mouth at bedtime as needed., Disp: , Rfl:  Past Medical History:   Diagnosis Date  . Asthma   . Cardiomyopathy (HCC)   . CHF (congestive heart failure) (HCC)   . Chronic kidney disease   . COPD (chronic obstructive pulmonary disease) (HCC)   . Coronary artery disease   . H/O heart valve replacement with mechanical valve   . Heart attack (HCC)   . Hyperlipidemia   . Hypertension   . Paroxysmal atrial fibrillation (HCC)   . Stroke [facility])     ASSESSMENT  Recent Results: The most recent result is correlated with 24 mg per week:  Lab Results  Component Value Date   INR 2.3 02/15/2020   INR 1.8 (A) 02/01/2020   INR 4.8 (A) 01/12/2020    Anticoagulation Dosing: Description   INR below goal. Increase weekly dose to 4.5 mg every Tue, Thu, Sat; 3 mg all other days. Recheck INR in 3 weeks       INR today: Subtherapeutic. INR trending up but still remains below goal. Pt would like to increase her Vit K intake to 2 servings of high Vit K servings/week. Denies any other relevant changes in medications, diet, or lifestyle. Denies any complains of bleeding or bruising symptoms. Will consider weekly dose and have close monitoring to ensure INR returns to being therapeutic.   PLAN Weekly dose was increased by 6.2% to 25.5 mg/week. Increase weekly dose to 4.5 mg every Tues, Thurs, Sat and 3 mg all other days. Recheck INR in 3 weeks  Patient Instructions  INR below goal. Increase weekly dose to 4.5 mg every Tue, Thu, Sat; 3 mg all other days. Recheck INR in 3 weeks   Patient advised to contact clinic or seek medical attention if signs/symptoms of bleeding or thromboembolism occur.  Patient verbalized understanding by repeating back information and was advised to contact me if further medication-related questions arise.   Follow-up Return in about 22 days (around 03/08/2020).  Leonides Schanz, PharmD  15 minutes spent face-to-face with the patient during the encounter. 50% of time spent on education, including signs/sx bleeding and clotting, as well as  food and drug interactions with warfarin. 50% of time was spent on fingerprick POC INR sample collection,processing, results determination, and documentation

## 2020-02-15 NOTE — Progress Notes (Signed)
Reviewed with pt while in the office. Will initiate transition of her chronic cardiac medications to [facility].

## 2020-03-08 ENCOUNTER — Ambulatory Visit: Payer: Medicaid Other | Admitting: Pharmacist

## 2020-03-08 ENCOUNTER — Other Ambulatory Visit: Payer: Self-pay

## 2020-03-08 DIAGNOSIS — Z952 Presence of prosthetic heart valve: Secondary | ICD-10-CM

## 2020-03-08 DIAGNOSIS — Z5181 Encounter for therapeutic drug level monitoring: Secondary | ICD-10-CM

## 2020-03-08 DIAGNOSIS — I4819 Other persistent atrial fibrillation: Secondary | ICD-10-CM

## 2020-03-08 DIAGNOSIS — Z7901 Long term (current) use of anticoagulants: Secondary | ICD-10-CM

## 2020-03-08 LAB — POCT INR: INR: 3.2 — AB (ref 2.0–3.0)

## 2020-03-09 ENCOUNTER — Other Ambulatory Visit: Payer: Self-pay | Admitting: Pharmacist

## 2020-03-09 DIAGNOSIS — I480 Paroxysmal atrial fibrillation: Secondary | ICD-10-CM

## 2020-03-09 DIAGNOSIS — Z5181 Encounter for therapeutic drug level monitoring: Secondary | ICD-10-CM

## 2020-03-09 DIAGNOSIS — Z952 Presence of prosthetic heart valve: Secondary | ICD-10-CM

## 2020-03-09 DIAGNOSIS — Z7901 Long term (current) use of anticoagulants: Secondary | ICD-10-CM

## 2020-03-09 MED ORDER — ENOXAPARIN SODIUM 100 MG/ML ~~LOC~~ SOLN: 1.0000 mg/kg | Freq: Two times a day (BID) | SUBCUTANEOUS | 1 refills | Status: DC

## 2020-03-09 NOTE — Progress Notes (Signed)
Anticoagulation Management Joanna Martinez is a 60 y.o. female who reports to the clinic for monitoring of warfarin treatment.    Indication: s/p Saint Jude mechanical mitral valve replacement in March 2002   Duration: indefinite Supervising physician: Tessa Lerner  Anticoagulation Clinic Visit History:.  Hx of multiple previous strokes. Most recent incidence 3 or 4 years ago. Pt also on Plavix for history of coronary artery disease status post drug-eluting stent in August 2019. Noteded major DDI b/w warfarin and plavix of increased bleeding risk. Noted DDI b/w warfarin and allopurinol about increased anticoagulant effect as well. Pt denies any recent allopurinol dose changes.Pt confirmed to have a tan colored (3 mg) warfarin tablet at home. Pt started taking warfarin in the evening as previously discussed.     Patient does not report signs/symptoms of bleeding or thromboembolism.   Other recent changes: No changes in medications or lifestyle. Pt normally has 1 serving of collard greens/week and 2 serving of salad TIW.   Pt has a scheduled EGD/colonoscopy for 03/16/20. Called [facility] to confirm the procedure and the need for holding warfarin and bridging with Lovenox.   Anticoagulation Episode Summary    Current INR goal:  2.5-3.5  TTR:  59.9 % (7.7 mo)  Next INR check:  03/22/2020  INR from last check:  3.2 (03/08/2020)  Weekly max warfarin dose:  24 mg  Target end date:  Indefinite  INR check location:    Preferred lab:    Send INR reminders to:     Indications   S/P mitral valve replacement [Z95.2] Atrial fibrillation (HCC) [I48.91] Monitoring for long-term anticoagulant use [Z51.81 Z79.01]       Comments:          No Known Allergies  Current Outpatient Medications:  .  enoxaparin (LOVENOX) 100 MG/ML injection, Inject 0.9 mLs (90 mg total) into the skin every 12 (twelve) hours. Discard 0.1 mL (10 units) before using., Disp: 24 mL, Rfl: 1 .  allopurinol  (ZYLOPRIM) 100 MG tablet, Take 1 tablet by mouth daily. , Disp: , Rfl:  .  atorvastatin (LIPITOR) 80 MG tablet, Take 1 tablet (80 mg total) by mouth at bedtime., Disp: 90 tablet, Rfl: 1 .  buPROPion (WELLBUTRIN SR) 150 MG 12 hr tablet, Take 1 tablet by mouth 2 (two) times daily. , Disp: , Rfl:  .  clopidogrel (PLAVIX) 75 MG tablet, Take 1 tablet (75 mg total) by mouth daily., Disp: 90 tablet, Rfl: 1 .  Diclofenac Sodium 3 % GEL, Apply topically., Disp: , Rfl:  .  DULoxetine (CYMBALTA) 30 MG capsule, Take 30 mg by mouth daily., Disp: , Rfl:  .  ferrous sulfate 325 (65 FE) MG EC tablet, Take 1 tablet by mouth daily., Disp: , Rfl:  .  gabapentin (NEURONTIN) 600 MG tablet, Take 1 capsule by mouth 3 (three) times daily. , Disp: , Rfl:  .  metoprolol succinate (TOPROL-XL) 25 MG 24 hr tablet, Take 1 tablet (25 mg total) by mouth daily. Hold if systolic blood pressure (top blood pressure number) less than 100 mmHg or heart rate less than 60 bpm (pulse)., Disp: 30 tablet, Rfl: 5 .  oxyCODONE-acetaminophen (PERCOCET/ROXICET) 5-325 MG tablet, Take 1 tablet by mouth 3 (three) times daily as needed., Disp: , Rfl:  .  QUEtiapine (SEROQUEL) 300 MG tablet, Take 1 tablet by mouth at bedtime., Disp: , Rfl:  .  sacubitril-valsartan (ENTRESTO) 49-51 MG, Take 1 tablet by mouth 2 (two) times daily., Disp: 180 tablet, Rfl: 3 .  tiZANidine (ZANAFLEX) 4 MG capsule, Take 4 mg by mouth 3 (three) times daily as needed. , Disp: , Rfl:  .  traZODone (DESYREL) 150 MG tablet, Take 150 mg by mouth at bedtime. , Disp: , Rfl:  .  Vitamin D, Ergocalciferol, (DRISDOL) 1.25 MG (50000 UNIT) CAPS capsule, Take 50,000 Units by mouth once a week., Disp: , Rfl:  .  warfarin (COUMADIN) 3 MG tablet, Take 1 tablet (3 mg total) by mouth daily. As  Directed; Take 4.5 mg every Tues and Thurs and 3 mg all other days. Refer to most recent anticoagulation note for most updated directions., Disp: 100 tablet, Rfl: 2 .  zolpidem (AMBIEN) 5 MG tablet,  Take 5 mg by mouth at bedtime as needed., Disp: , Rfl:  Past Medical History:  Diagnosis Date  . Asthma   . Cardiomyopathy (HCC)   . CHF (congestive heart failure) (HCC)   . Chronic kidney disease   . COPD (chronic obstructive pulmonary disease) (HCC)   . Coronary artery disease   . H/O heart valve replacement with mechanical valve   . Heart attack (HCC)   . Hyperlipidemia   . Hypertension   . Paroxysmal atrial fibrillation (HCC)   . Stroke [facility])     ASSESSMENT  Recent Results: The most recent result is correlated with 24 mg per week:  Lab Results  Component Value Date   INR 3.2 (A) 03/08/2020   INR 2.3 02/15/2020   INR 1.8 (A) 02/01/2020    Anticoagulation Dosing: Description   INR at goal. Continue weekly dose of 4.5 mg every Tue, Thu, Sat; 3 mg all other days. Recheck INR in 2 weeks   Stop warfarin 1/7, start lovenox 90 mg BID on 1/7. Restart warfarin on 1/12 if okay with GI provider. Recheck INR 1 week post procedure.       INR today: Therapeutic. Returns to be therapeutic after recent weekly dose increase. Pt staying consistent with Vit K intake to 2 servings/week. Denies any other relevant changes in medications, diet, or lifestyle. Denies any complains of bleeding or bruising symptoms. Reviewed lovenox bridge directions with pt. Pt to start Lovenox 90 mg BID starting 03/11/20. Will follow up 1 week post GI procedure.   PLAN Weekly dose was unchanged by 0% to 25.5 mg/week. Continue weekly dose of 4.5 mg every Tues, Thurs, Sat and 3 mg all other days. Recheck INR in 2 weeks  Patient Instructions  INR at goal. Continue weekly dose of 4.5 mg every Tue, Thu, Sat; 3 mg all other days. Recheck INR in 2 weeks   Stop warfarin 1/7, start lovenox 90 mg BID on 1/7. Restart warfarin on 1/12 if okay with GI provider. Recheck INR 1 week post procedure.   Patient advised to contact clinic or seek medical attention if signs/symptoms of bleeding or thromboembolism occur.  Patient  verbalized understanding by repeating back information and was advised to contact me if further medication-related questions arise.   Follow-up Return in about 2 weeks (around 03/22/2020).  Leonides Schanz, PharmD  15 minutes spent face-to-face with the patient during the encounter. 50% of time spent on education, including signs/sx bleeding and clotting, as well as food and drug interactions with warfarin. 50% of time was spent on fingerprick POC INR sample collection,processing, results determination, and documentation

## 2020-03-09 NOTE — Patient Instructions (Signed)
INR at goal. Continue weekly dose of 4.5 mg every Tue, Thu, Sat; 3 mg all other days. Recheck INR in 2 weeks   Stop warfarin 1/7, start lovenox 90 mg BID on 1/7. Restart warfarin on 1/12 if okay with GI provider. Recheck INR 1 week post procedure.

## 2020-03-22 ENCOUNTER — Other Ambulatory Visit: Payer: Self-pay | Admitting: Cardiology

## 2020-03-22 DIAGNOSIS — I2510 Atherosclerotic heart disease of native coronary artery without angina pectoris: Secondary | ICD-10-CM

## 2020-04-12 ENCOUNTER — Ambulatory Visit: Payer: Medicaid Other | Admitting: Pharmacist

## 2020-04-12 ENCOUNTER — Other Ambulatory Visit: Payer: Self-pay

## 2020-04-12 DIAGNOSIS — I480 Paroxysmal atrial fibrillation: Secondary | ICD-10-CM

## 2020-04-12 DIAGNOSIS — Z5181 Encounter for therapeutic drug level monitoring: Secondary | ICD-10-CM

## 2020-04-12 DIAGNOSIS — Z952 Presence of prosthetic heart valve: Secondary | ICD-10-CM

## 2020-04-12 LAB — POCT INR: INR: 2.4 (ref 2.0–3.0)

## 2020-04-12 NOTE — Progress Notes (Signed)
Anticoagulation Management Joanna Martinez is a 60 y.o. female who reports to the clinic for monitoring of warfarin treatment.    Indication: atrial fibrillation and s/p Saint Jude mechanical mitral valve replacement in March 2002 ; CHA2DS2 Vasc Score 6 (Female, CHF hx, HTN hx, Stroke/TIA hx, valvular heart disease), HAS-BLED 1 (Stroke hx)    Duration: indefinite Supervising physician: Tessa Lerner  Anticoagulation Clinic Visit History:.  Hx of multiple previous strokes. Most recent incidence 3 or 4 years ago. Pt also on Plavix for history of coronary artery disease status post drug-eluting stent in August 2019. Noteded major DDI b/w warfarin and plavix of increased bleeding risk. Noted DDI b/w warfarin and allopurinol about increased anticoagulant effect as well. Pt denies any recent allopurinol dose changes.Pt confirmed to have a tan colored (3 mg) warfarin tablet at home. Pt started taking warfarin in the evening as previously discussed.    Patient does not report signs/symptoms of bleeding or thromboembolism.   Other recent changes: No changes in medications or lifestyle. Pt normally has 1 serving of collard greens/week and 2 serving of salad TIW.   Pt has a scheduled EGD/colonoscopy for 04/20/20. Called [facility] to confirm the procedure and the need for holding warfarin and bridging with Lovenox.   Anticoagulation Episode Summary    Current INR goal:  2.5-3.5  TTR:  63.5 % (8.9 mo)  Next INR check:  04/25/2020  INR from last check:  2.4 (04/12/2020)  Weekly max warfarin dose:  24 mg  Target end date:  Indefinite  INR check location:    Preferred lab:    Send INR reminders to:     Indications   S/P mitral valve replacement [Z95.2] Atrial fibrillation (HCC) [I48.91] Monitoring for long-term anticoagulant use [Z51.81 Z79.01]       Comments:          No Known Allergies  Current Outpatient Medications:  .  enoxaparin (LOVENOX) 100 MG/ML injection, Inject 0.9 mLs  (90 mg total) into the skin every 12 (twelve) hours. Discard 0.1 mL (10 units) before using., Disp: 24 mL, Rfl: 1 .  allopurinol (ZYLOPRIM) 100 MG tablet, Take 1 tablet by mouth daily. , Disp: , Rfl:  .  ALPRAZolam (XANAX) 0.5 MG tablet, Take 0.5 mg by mouth 2 (two) times daily as needed., Disp: , Rfl:  .  atorvastatin (LIPITOR) 80 MG tablet, Take 1 tablet (80 mg total) by mouth at bedtime., Disp: 90 tablet, Rfl: 1 .  buPROPion (WELLBUTRIN SR) 150 MG 12 hr tablet, Take 1 tablet by mouth 2 (two) times daily. , Disp: , Rfl:  .  Diclofenac Sodium 3 % GEL, Apply topically., Disp: , Rfl:  .  DULoxetine (CYMBALTA) 30 MG capsule, Take 30 mg by mouth daily., Disp: , Rfl:  .  ferrous sulfate 325 (65 FE) MG EC tablet, Take 1 tablet by mouth daily., Disp: , Rfl:  .  gabapentin (NEURONTIN) 600 MG tablet, Take 1 capsule by mouth 3 (three) times daily. , Disp: , Rfl:  .  metoprolol succinate (TOPROL-XL) 25 MG 24 hr tablet, TAKE 1 TABLET BY MOUTH ONCE DAILY *HOLD WHEN SYSTOLIC BLOOD PRESSEUR IS LESS THATN 100 OR HEARTRATE IS LESS THAN 60 BEATS PER MINUTE*, Disp: 30 tablet, Rfl: 0 .  oxyCODONE-acetaminophen (PERCOCET/ROXICET) 5-325 MG tablet, Take 1 tablet by mouth 3 (three) times daily as needed., Disp: , Rfl:  .  QUEtiapine (SEROQUEL) 400 MG tablet, Take 400 mg by mouth at bedtime., Disp: , Rfl:  .  sacubitril-valsartan (  ENTRESTO) 49-51 MG, Take 1 tablet by mouth 2 (two) times daily., Disp: 180 tablet, Rfl: 3 .  SUTAB [phone] MG TABS, TAKE 24 TABLETS BY MOUTH PER PREP TEST, Disp: , Rfl:  .  tiZANidine (ZANAFLEX) 4 MG tablet, Take 4 mg by mouth 3 (three) times daily as needed., Disp: , Rfl:  .  traZODone (DESYREL) 150 MG tablet, Take 150 mg by mouth at bedtime. , Disp: , Rfl:  .  Vitamin D, Ergocalciferol, (DRISDOL) 1.25 MG (50000 UNIT) CAPS capsule, Take 50,000 Units by mouth once a week., Disp: , Rfl:  .  warfarin (COUMADIN) 3 MG tablet, Take 1 tablet (3 mg total) by mouth daily. As  Directed; Take 4.5 mg  every Tues and Thurs and 3 mg all other days. Refer to most recent anticoagulation note for most updated directions., Disp: 100 tablet, Rfl: 2 .  zolpidem (AMBIEN) 5 MG tablet, Take 5 mg by mouth at bedtime as needed., Disp: , Rfl:  Past Medical History:  Diagnosis Date  . Asthma   . Cardiomyopathy (HCC)   . CHF (congestive heart failure) (HCC)   . Chronic kidney disease   . COPD (chronic obstructive pulmonary disease) (HCC)   . Coronary artery disease   . H/O heart valve replacement with mechanical valve   . Heart attack (HCC)   . Hyperlipidemia   . Hypertension   . Paroxysmal atrial fibrillation (HCC)   . Stroke [facility])     ASSESSMENT  Recent Results: The most recent result is correlated with 25.5 mg per week:  Lab Results  Component Value Date   INR 2.4 04/12/2020   INR 3.2 (A) 03/08/2020   INR 2.3 02/15/2020    Anticoagulation Dosing: Description   INR below goal. Continue weekly dose of 4.5 mg every Tue, Thu, Sat; 3 mg all other days. Recheck INR in 2 weeks. Follow Lovenox bridge directions as reviewed.      INR today: Subtherapeutic.slightly below goal.  Previously therapeutic. Upcoming colonoscopy and need for pt to hold warfarin 5 days prior to procedure. Reviewed bridging directions in detail with pt and pt verbalized understanding using teach-back technique. Will review post-op INR following 1 week post procedure.   Most recent wt of 90 kg. Will use Lovenox 100 mg and instructed pt to discard 0.1 units. Visualized with pt and pt verbalized understanding and is agreeable with being able to discard 0.1 units herself prior to each injections.   PLAN Weekly dose was unchanged by 0% to 25.5 mg/week. Continue weekly dose of 4.5 mg every Tues, Thurs, Sat and 3 mg all other days. Recheck INR in 2 weeks. Review bridging directions  Please follow the Medication Instructions below for your procedure:  Day surgery or procedure planned: 04/20/20 Procedure or surgery planned:  Colonoscopy   Coumadin tablet size: 3 mg  Important Patient Information:  . Before your scheduled procedure, your warfarin (Coumadin/Jantoven) must be stopped t prevent bleeding during and after the procedure . You are being prescribed enoxaparin (Lovenox) injection, a short acting blood thinner, to use before and after your procedure while the the warfarin (Coumadin/Jantoven) is not in your desired range . Before your procedure, you will stop taking your warfarin (Coumadin/Jantoven) and only give yourself enoxaparin (Lovenox) injections as instructed. After your procedure you will resume taking your warfarin AND also the enoxaparin (Lovenox) until your INR has reached target levels.  . It is very important to follow all instructions as directed and keep all scheduled Coumadin Clinic appointments to  ensure your health and safety. Your physician may change the instructions after the procedure depending on your health status  RX for Lovenox has been sent to your pharmacy. Please pick up asap in case they have to order it, etc.       Warfarin Bridge Table    Date/Day of the week Warfarin instructions Lovenox Instructions  04/14/20  Last dose of warfarin    Do not take Lovenox  04/15/20  No Coumadin/warfarin    Do not take Lovenox  04/16/20  No Coumadin/warfarin     Start in the evening and take 90 mg of lovenox   04/17/20  No Coumadin/warfarin    Take Lovenox 90 mg every 12 hrs   04/18/20 No Coumadin/warfarin Take Lovenox 90 mg every 12 hrs   04/19/20  No Coumadin/warfarin    Take Lovenox 90 mg in the morning only (need 24 hrs between last dose of Lovenox and planned surgery/procedure)  04/20/20 Day of Surgery  No Coumadin/warfarin    **Review with surgeon when to restart warfarin and Lovenox** No Lovenox/enoxaparin     04/21/20  Warfarin 6 mg in PM (2 tablet(s))   No Lovenox  Start Lovenox 90 mg every 12 hrs start AM  04/22/20   Warfarin 3 mg in PM (1 tablet(s))   No Lovenox  Take Lovenox 90 mg  every 12 hrs   04/23/20  Warfarin 4.5 mg in PM (1.5 tablet(s))   No Lovenox  Take Lovenox 90 mg every 12 hrs   04/24/20  Warfarin 3 mg in PM (1 tablet(s))   Take Lovenox 90 mg every 12 hrs   04/25/20  Warfarin 3 mg in PM (1 tablet(s))   Take Lovenox 90 mg every 12 hrs          Your next INR check in on 04/25/20         Patient Instructions  INR below goal. Continue weekly dose of 4.5 mg every Tue, Thu, Sat; 3 mg all other days. Recheck INR in 2 weeks. Follow Lovenox bridge directions as reviewed.   Patient advised to contact clinic or seek medical attention if signs/symptoms of bleeding or thromboembolism occur.  Patient verbalized understanding by repeating back information and was advised to contact me if further medication-related questions arise.   Follow-up Return in about 13 days (around 04/25/2020).  Leonides Schanz, PharmD  15 minutes spent face-to-face with the patient during the encounter. 50% of time spent on education, including signs/sx bleeding and clotting, as well as food and drug interactions with warfarin. 50% of time was spent on fingerprick POC INR sample collection,processing, results determination, and documentation

## 2020-04-12 NOTE — Patient Instructions (Signed)
INR below goal. Continue weekly dose of 4.5 mg every Tue, Thu, Sat; 3 mg all other days. Recheck INR in 2 weeks. Follow Lovenox bridge directions as reviewed.

## 2020-04-21 ENCOUNTER — Encounter: Payer: Self-pay | Admitting: Pharmacist

## 2020-04-25 ENCOUNTER — Other Ambulatory Visit: Payer: Self-pay | Admitting: Pharmacist

## 2020-04-25 ENCOUNTER — Ambulatory Visit: Payer: Self-pay | Admitting: Pharmacist

## 2020-04-25 ENCOUNTER — Ambulatory Visit: Payer: Medicaid Other

## 2020-04-25 DIAGNOSIS — Z5181 Encounter for therapeutic drug level monitoring: Secondary | ICD-10-CM

## 2020-04-25 DIAGNOSIS — Z952 Presence of prosthetic heart valve: Secondary | ICD-10-CM

## 2020-04-25 DIAGNOSIS — I480 Paroxysmal atrial fibrillation: Secondary | ICD-10-CM

## 2020-04-25 DIAGNOSIS — Z7901 Long term (current) use of anticoagulants: Secondary | ICD-10-CM

## 2020-04-25 LAB — POCT INR: INR: 1.6 — AB (ref 2.0–3.0)

## 2020-04-25 NOTE — Progress Notes (Signed)
Anticoagulation Management Joanna Martinez is a 60 y.o. female who reports to the clinic for monitoring of warfarin treatment.    Indication: atrial fibrillation and s/p Saint Jude mechanical mitral valve replacement in March 2002 ; CHA2DS2 Vasc Score 6 (Female, CHF hx, HTN hx, Stroke/TIA hx, valvular heart disease), HAS-BLED 1 (Stroke hx)    Duration: indefinite Supervising physician: Tessa Lerner  Anticoagulation Clinic Visit History:.  Hx of multiple previous strokes. Most recent incidence 3 or 4 years ago. Pt also on Plavix for history of coronary artery disease status post drug-eluting stent in August 2019. Noteded major DDI b/w warfarin and plavix of increased bleeding risk. Noted DDI b/w warfarin and allopurinol about increased anticoagulant effect as well. Pt denies any recent allopurinol dose changes.Pt confirmed to have a tan colored (3 mg) warfarin tablet at home. Pt started taking warfarin in the evening as previously discussed.     Patient does not report signs/symptoms of bleeding or thromboembolism.   Other recent changes: No changes in medications or lifestyle.   Pt recovering well following recent colonoscopy. Followed bridge directions as directed. Reports waiting for biopsy results before confirming any follow-up work-up.   Anticoagulation Episode Summary    Current INR goal:  2.5-3.5  TTR:  60.5 % (9.3 mo)  Next INR check:  04/28/2020  INR from last check:  1.6 (04/25/2020)  Weekly max warfarin dose:    Target end date:  Indefinite  INR check location:    Preferred lab:    Send INR reminders to:     Indications   S/P mitral valve replacement [Z95.2] Paroxysmal atrial fibrillation (HCC) [I48.0] Monitoring for long-term anticoagulant use [Z51.81 Z79.01]       Comments:          No Known Allergies  Current Outpatient Medications:  .  enoxaparin (LOVENOX) 100 MG/ML injection, Inject 0.9 mLs (90 mg total) into the skin every 12 (twelve) hours. Discard 0.1 mL (10  units) before using., Disp: 24 mL, Rfl: 1 .  allopurinol (ZYLOPRIM) 100 MG tablet, Take 1 tablet by mouth daily. , Disp: , Rfl:  .  ALPRAZolam (XANAX) 0.5 MG tablet, Take 0.5 mg by mouth 2 (two) times daily as needed., Disp: , Rfl:  .  atorvastatin (LIPITOR) 80 MG tablet, Take 1 tablet (80 mg total) by mouth at bedtime., Disp: 90 tablet, Rfl: 1 .  buPROPion (WELLBUTRIN SR) 150 MG 12 hr tablet, Take 1 tablet by mouth 2 (two) times daily. , Disp: , Rfl:  .  Diclofenac Sodium 3 % GEL, Apply topically., Disp: , Rfl:  .  DULoxetine (CYMBALTA) 30 MG capsule, Take 30 mg by mouth daily., Disp: , Rfl:  .  ferrous sulfate 325 (65 FE) MG EC tablet, Take 1 tablet by mouth daily., Disp: , Rfl:  .  gabapentin (NEURONTIN) 600 MG tablet, Take 1 capsule by mouth 3 (three) times daily. , Disp: , Rfl:  .  metoprolol succinate (TOPROL-XL) 25 MG 24 hr tablet, TAKE 1 TABLET BY MOUTH ONCE DAILY *HOLD WHEN SYSTOLIC BLOOD PRESSEUR IS LESS THATN 100 OR HEARTRATE IS LESS THAN 60 BEATS PER MINUTE*, Disp: 30 tablet, Rfl: 0 .  oxyCODONE-acetaminophen (PERCOCET/ROXICET) 5-325 MG tablet, Take 1 tablet by mouth 3 (three) times daily as needed., Disp: , Rfl:  .  QUEtiapine (SEROQUEL) 400 MG tablet, Take 400 mg by mouth at bedtime., Disp: , Rfl:  .  sacubitril-valsartan (ENTRESTO) 49-51 MG, Take 1 tablet by mouth 2 (two) times daily., Disp: 180 tablet, Rfl: 3 .  SUTAB [phone] MG TABS, TAKE 24 TABLETS BY MOUTH PER PREP TEST, Disp: , Rfl:  .  tiZANidine (ZANAFLEX) 4 MG tablet, Take 4 mg by mouth 3 (three) times daily as needed., Disp: , Rfl:  .  traZODone (DESYREL) 150 MG tablet, Take 150 mg by mouth at bedtime. , Disp: , Rfl:  .  Vitamin D, Ergocalciferol, (DRISDOL) 1.25 MG (50000 UNIT) CAPS capsule, Take 50,000 Units by mouth once a week., Disp: , Rfl:  .  warfarin (COUMADIN) 3 MG tablet, Take 1 tablet (3 mg total) by mouth daily. As  Directed; Take 4.5 mg every Tues and Thurs and 3 mg all other days. Refer to most recent  anticoagulation note for most updated directions., Disp: 100 tablet, Rfl: 2 .  zolpidem (AMBIEN) 5 MG tablet, Take 5 mg by mouth at bedtime as needed., Disp: , Rfl:  Past Medical History:  Diagnosis Date  . Asthma   . Cardiomyopathy (HCC)   . CHF (congestive heart failure) (HCC)   . Chronic kidney disease   . COPD (chronic obstructive pulmonary disease) (HCC)   . Coronary artery disease   . H/O heart valve replacement with mechanical valve   . Heart attack (HCC)   . Hyperlipidemia   . Hypertension   . Paroxysmal atrial fibrillation (HCC)   . Stroke [facility])     ASSESSMENT  Recent Results: The most recent result is correlated with 25.5 mg per week:  Lab Results  Component Value Date   INR 1.6 (A) 04/25/2020   INR 2.4 04/12/2020   INR 3.2 (A) 03/08/2020    Anticoagulation Dosing: Description   INR below goal. Continue taking Lovenox 90 mcg Q12 hrs. Take 6 mg daily. Recheck INR in 3 days.      INR today: Subtherapeutic following recent held doses in setting of colonoscopy procedure. Pt denies any thromboembolic clot symptoms. Pt denies any bleeding and bruising symptoms. Tolerating Lovenox well w/o any ADRs. Will continue to boost and continue Lovenox dose till INR returns to being therapeutic. Will continue close monitoring and follow-up.   PLAN Weekly dose was unchanged by 0% to 25.5 mg/week. Continue taking Lovenox 90 mcg Q12hrs and take warfarin 6 mg daily. Recheck INR in 3 days.   Patient Instructions  INR below goal. Continue taking Lovenox 90 mcg Q12 hrs. Take 6 mg daily. Recheck INR in 3 days.   Patient advised to contact clinic or seek medical attention if signs/symptoms of bleeding or thromboembolism occur.  Patient verbalized understanding by repeating back information and was advised to contact me if further medication-related questions arise.   Follow-up Return in about 3 days (around 04/28/2020).  Leonides Schanz, PharmD  15 minutes spent face-to-face with the  patient during the encounter. 50% of time spent on education, including signs/sx bleeding and clotting, as well as food and drug interactions with warfarin. 50% of time was spent on fingerprick POC INR sample collection,processing, results determination, and documentation

## 2020-04-25 NOTE — Patient Instructions (Signed)
INR below goal. Continue taking Lovenox 90 mcg Q12 hrs. Take 6 mg daily. Recheck INR in 3 days.

## 2020-04-28 ENCOUNTER — Ambulatory Visit: Payer: Self-pay | Admitting: Pharmacist

## 2020-04-28 DIAGNOSIS — Z952 Presence of prosthetic heart valve: Secondary | ICD-10-CM

## 2020-04-28 DIAGNOSIS — Z5181 Encounter for therapeutic drug level monitoring: Secondary | ICD-10-CM

## 2020-04-28 DIAGNOSIS — I480 Paroxysmal atrial fibrillation: Secondary | ICD-10-CM

## 2020-04-28 LAB — POCT INR: INR: 3.3 — AB (ref 2.0–3.0)

## 2020-04-28 NOTE — Patient Instructions (Addendum)
ININR at goal. Take 4.5 mg every Tues, Thurs, Sat, 3 mg all other days. Recheck INR in 1 week.

## 2020-04-28 NOTE — Progress Notes (Addendum)
Anticoagulation Management Joanna Martinez is a 60 y.o. female who reports to the clinic for monitoring of warfarin treatment.    Indication: atrial fibrillation and s/p Saint Jude mechanical mitral valve replacement in March 2002 ; CHA2DS2 Vasc Score 6 (Female, CHF hx, HTN hx, Stroke/TIA hx, valvular heart disease), HAS-BLED 1 (Stroke hx)    Duration: indefinite Supervising physician: Tessa Lerner  Anticoagulation Clinic Visit History:.  Hx of multiple previous strokes. Most recent incidence 3 or 4 years ago. Pt also on Plavix for history of coronary artery disease status post drug-eluting stent in August 2019. Noteded major DDI b/w warfarin and plavix of increased bleeding risk. Noted DDI b/w warfarin and allopurinol about increased anticoagulant effect as well. Pt denies any recent allopurinol dose changes.Pt confirmed to have a tan colored (3 mg) warfarin tablet at home. Pt started taking warfarin in the evening as previously discussed.     Patient does not report signs/symptoms of bleeding or thromboembolism.   Other recent changes: No changes in medications or lifestyle.   Pt recovering well following recent colonoscopy. Followed bridge directions as directed. Reports waiting for biopsy results before confirming any follow-up work-up.   Anticoagulation Episode Summary    Current INR goal:  2.5-3.5  TTR:  60.4 % (9.4 mo)  Next INR check:  05/05/2020  INR from last check:  3.3 (04/28/2020)  Weekly max warfarin dose:    Target end date:  Indefinite  INR check location:    Preferred lab:    Send INR reminders to:     Indications   S/P mitral valve replacement [Z95.2] Paroxysmal atrial fibrillation (HCC) [I48.0] Monitoring for long-term anticoagulant use [Z51.81 Z79.01]       Comments:          No Known Allergies  Current Outpatient Medications:  .  enoxaparin (LOVENOX) 100 MG/ML injection, Inject 0.9 mLs (90 mg total) into the skin every 12 (twelve) hours. Discard 0.1 mL (10  units) before using., Disp: 24 mL, Rfl: 1 .  allopurinol (ZYLOPRIM) 100 MG tablet, Take 1 tablet by mouth daily. , Disp: , Rfl:  .  ALPRAZolam (XANAX) 0.5 MG tablet, Take 0.5 mg by mouth 2 (two) times daily as needed., Disp: , Rfl:  .  atorvastatin (LIPITOR) 80 MG tablet, Take 1 tablet (80 mg total) by mouth at bedtime., Disp: 90 tablet, Rfl: 1 .  buPROPion (WELLBUTRIN SR) 150 MG 12 hr tablet, Take 1 tablet by mouth 2 (two) times daily. , Disp: , Rfl:  .  Diclofenac Sodium 3 % GEL, Apply topically., Disp: , Rfl:  .  DULoxetine (CYMBALTA) 30 MG capsule, Take 30 mg by mouth daily., Disp: , Rfl:  .  ferrous sulfate 325 (65 FE) MG EC tablet, Take 1 tablet by mouth daily., Disp: , Rfl:  .  gabapentin (NEURONTIN) 600 MG tablet, Take 1 capsule by mouth 3 (three) times daily. , Disp: , Rfl:  .  metoprolol succinate (TOPROL-XL) 25 MG 24 hr tablet, TAKE 1 TABLET BY MOUTH ONCE DAILY *HOLD WHEN SYSTOLIC BLOOD PRESSEUR IS LESS THATN 100 OR HEARTRATE IS LESS THAN 60 BEATS PER MINUTE*, Disp: 30 tablet, Rfl: 0 .  oxyCODONE-acetaminophen (PERCOCET/ROXICET) 5-325 MG tablet, Take 1 tablet by mouth 3 (three) times daily as needed., Disp: , Rfl:  .  QUEtiapine (SEROQUEL) 400 MG tablet, Take 400 mg by mouth at bedtime., Disp: , Rfl:  .  sacubitril-valsartan (ENTRESTO) 49-51 MG, Take 1 tablet by mouth 2 (two) times daily., Disp: 180 tablet, Rfl: 3 .  SUTAB [phone] MG TABS, TAKE 24 TABLETS BY MOUTH PER PREP TEST, Disp: , Rfl:  .  tiZANidine (ZANAFLEX) 4 MG tablet, Take 4 mg by mouth 3 (three) times daily as needed., Disp: , Rfl:  .  traZODone (DESYREL) 150 MG tablet, Take 150 mg by mouth at bedtime. , Disp: , Rfl:  .  Vitamin D, Ergocalciferol, (DRISDOL) 1.25 MG (50000 UNIT) CAPS capsule, Take 50,000 Units by mouth once a week., Disp: , Rfl:  .  warfarin (COUMADIN) 3 MG tablet, Take 1 tablet (3 mg total) by mouth daily. As  Directed; Take 4.5 mg every Tues and Thurs and 3 mg all other days. Refer to most recent  anticoagulation note for most updated directions., Disp: 100 tablet, Rfl: 2 .  zolpidem (AMBIEN) 5 MG tablet, Take 5 mg by mouth at bedtime as needed., Disp: , Rfl:  Past Medical History:  Diagnosis Date  . Asthma   . Cardiomyopathy (HCC)   . CHF (congestive heart failure) (HCC)   . Chronic kidney disease   . COPD (chronic obstructive pulmonary disease) (HCC)   . Coronary artery disease   . H/O heart valve replacement with mechanical valve   . Heart attack (HCC)   . Hyperlipidemia   . Hypertension   . Paroxysmal atrial fibrillation (HCC)   . Stroke [facility])     ASSESSMENT  Recent Results: The most recent result is correlated with 25.5 mg per week:  Lab Results  Component Value Date   INR 3.3 (A) 04/28/2020   INR 1.6 (A) 04/25/2020   INR 2.4 04/12/2020    Anticoagulation Dosing: Description   INR at goal. Take 4.5 mg every Tues, Thurs, Sat, 3 mg all other days. Recheck INR in 1 week.      INR today: Therapeutic INR>2, will have pt discontinue Lovenox today. Denies any complains of bleeding or bruising symptoms. Denies any other relevant changes in her diet, medications, or lifestyle. Will have pt retry previous maintenance dose and continues close monitoring in setting recent post op INR changes.   PLAN Weekly dose was unchanged by 0% to 25.5 mg/week. Stop taking Lovenox 90 mcg Q12hrs and take warfarin 4.5 mg every Tue, Thu, Sat; 3 mg all other days. Recheck INR in 7 days.   Patient Instructions  ININR at goal. Take 4.5 mg every Tues, Thurs, Sat, 3 mg all other days. Recheck INR in 1 week.   Patient advised to contact clinic or seek medical attention if signs/symptoms of bleeding or thromboembolism occur.  Patient verbalized understanding by repeating back information and was advised to contact me if further medication-related questions arise.   Follow-up Return in about 1 week (around 05/05/2020).  Leonides Schanz, PharmD  15 minutes spent face-to-face with the patient  during the encounter. 50% of time spent on education, including signs/sx bleeding and clotting, as well as food and drug interactions with warfarin. 50% of time was spent on fingerprick POC INR sample collection,processing, results determination, and documentation

## 2020-05-05 ENCOUNTER — Other Ambulatory Visit: Payer: Self-pay

## 2020-05-05 ENCOUNTER — Ambulatory Visit: Payer: Medicaid Other | Admitting: Pharmacist

## 2020-05-05 ENCOUNTER — Encounter (HOSPITAL_COMMUNITY): Payer: Self-pay | Admitting: Emergency Medicine

## 2020-05-05 ENCOUNTER — Emergency Department (HOSPITAL_COMMUNITY): Payer: Medicaid Other

## 2020-05-05 ENCOUNTER — Emergency Department (HOSPITAL_COMMUNITY)
Admission: EM | Admit: 2020-05-05 | Discharge: 2020-05-05 | Disposition: A | Discharge status: Home / Self Care | Payer: Medicaid Other | Attending: Emergency Medicine | Admitting: Emergency Medicine

## 2020-05-05 DIAGNOSIS — W19XXXA Unspecified fall, initial encounter: Secondary | ICD-10-CM

## 2020-05-05 DIAGNOSIS — I509 Heart failure, unspecified: Secondary | ICD-10-CM | POA: Diagnosis not present

## 2020-05-05 DIAGNOSIS — J449 Chronic obstructive pulmonary disease, unspecified: Secondary | ICD-10-CM | POA: Insufficient documentation

## 2020-05-05 DIAGNOSIS — Z955 Presence of coronary angioplasty implant and graft: Secondary | ICD-10-CM | POA: Insufficient documentation

## 2020-05-05 DIAGNOSIS — I2510 Atherosclerotic heart disease of native coronary artery without angina pectoris: Secondary | ICD-10-CM | POA: Insufficient documentation

## 2020-05-05 DIAGNOSIS — Z79899 Other long term (current) drug therapy: Secondary | ICD-10-CM | POA: Insufficient documentation

## 2020-05-05 DIAGNOSIS — F17210 Nicotine dependence, cigarettes, uncomplicated: Secondary | ICD-10-CM | POA: Diagnosis not present

## 2020-05-05 DIAGNOSIS — W010XXA Fall on same level from slipping, tripping and stumbling without subsequent striking against object, initial encounter: Secondary | ICD-10-CM | POA: Insufficient documentation

## 2020-05-05 DIAGNOSIS — I480 Paroxysmal atrial fibrillation: Secondary | ICD-10-CM

## 2020-05-05 DIAGNOSIS — Y92009 Unspecified place in unspecified non-institutional (private) residence as the place of occurrence of the external cause: Secondary | ICD-10-CM | POA: Diagnosis not present

## 2020-05-05 DIAGNOSIS — Z952 Presence of prosthetic heart valve: Secondary | ICD-10-CM

## 2020-05-05 DIAGNOSIS — I130 Hypertensive heart and chronic kidney disease with heart failure and stage 1 through stage 4 chronic kidney disease, or unspecified chronic kidney disease: Secondary | ICD-10-CM | POA: Diagnosis not present

## 2020-05-05 DIAGNOSIS — S0990XA Unspecified injury of head, initial encounter: Secondary | ICD-10-CM | POA: Diagnosis present

## 2020-05-05 DIAGNOSIS — I252 Old myocardial infarction: Secondary | ICD-10-CM | POA: Insufficient documentation

## 2020-05-05 DIAGNOSIS — J45909 Unspecified asthma, uncomplicated: Secondary | ICD-10-CM | POA: Diagnosis not present

## 2020-05-05 DIAGNOSIS — Z7901 Long term (current) use of anticoagulants: Secondary | ICD-10-CM

## 2020-05-05 DIAGNOSIS — N189 Chronic kidney disease, unspecified: Secondary | ICD-10-CM | POA: Insufficient documentation

## 2020-05-05 DIAGNOSIS — Z5181 Encounter for therapeutic drug level monitoring: Secondary | ICD-10-CM

## 2020-05-05 LAB — POCT INR: INR: 2.3 (ref 2.0–3.0)

## 2020-05-05 NOTE — ED Triage Notes (Signed)
Pt here from home with c/o fall this past Sunday that she was not seen , pt is on  A blood thinner

## 2020-05-05 NOTE — ED Provider Notes (Signed)
[facility] EMERGENCY DEPARTMENT Provider Note   CSN: [id number] Arrival date & time: 05/05/20  1048     History Chief Complaint: fall  Joanna Martinez is a 60 y.o. female w/ h/o Afib s/p [employer] mechanical mitral valve replacement (2002) on Coumadin, CHF, HTN, HLD, previous CVA/TIA, COPD, and GAD who presents to the ED from INR clinic for. Fall. Mechanical GLW 4 days ago at home after she tripped over coffee table and fell backwards and hit her head. Denies LOC. Doing well since the fall. Denies prodromal symptoms. Seen at INR clinic today where she told staff about fall and requested that she come to ED for concern for ICH or CVA/TIA. Coumadin therapeutic today at 2.3. Denies headache, neck pain, vision changes, weakness, numbness, chest pain, SOB, abdominal pain, or back pain.   The history is provided by the patient and medical records.  Fall This is a new problem. The current episode started more than 2 days ago. The problem occurs rarely. The problem has been resolved. Pertinent negatives include no chest pain, no abdominal pain, no headaches and no shortness of breath. Nothing aggravates the symptoms. Nothing relieves the symptoms. She has tried nothing for the symptoms.       Past Medical History:  Diagnosis Date  . Asthma   . Cardiomyopathy (HCC)   . CHF (congestive heart failure) (HCC)   . Chronic kidney disease   . COPD (chronic obstructive pulmonary disease) (HCC)   . Coronary artery disease   . H/O heart valve replacement with mechanical valve   . Heart attack (HCC)   . Hyperlipidemia   . Hypertension   . Paroxysmal atrial fibrillation (HCC)   . Stroke [facility])     Patient Active Problem List   Diagnosis Date Noted  . Mitral valve disorder 12/22/2019  . Hyperlipidemia 12/22/2019  . Monitoring for long-term anticoagulant use 08/28/2019  . Long term (current) use of anticoagulants 07/10/2019  . S/P mitral valve replacement 07/10/2019  . Upper GI  bleed 02/10/2019  . Major depression with psychotic features (HCC) 05/14/2018  . Paroxysmal atrial fibrillation (HCC) 10/22/2017  . Cardiac LV ejection fraction >40% 10/22/2017  . History of chronic pain 10/16/2017  . TIA (transient ischemic attack) 08/23/2017  . GAD (generalized anxiety disorder) 05/03/2017  . Essential hypertension 05/03/2017  . Unspecified sequelae of cerebral infarction 07/01/2015  . Pain in right hand 07/01/2015  . H/O mitral valve replacement with mechanical valve 09/11/2012    Past Surgical History:  Procedure Laterality Date  . CARDIAC SURGERY    . CORONARY ANGIOPLASTY WITH STENT PLACEMENT    . MITRAL VALVE SURGERY       OB History   No obstetric history on file.     Family History  Problem Relation Age of Onset  . HIV/AIDS Mother   . HIV/AIDS Father   . Hyperlipidemia Sister   . Hypertension Sister   . Diabetes Sister   . Hyperlipidemia Brother   . Hypertension Brother   . Diabetes Brother   . Hyperlipidemia Brother   . Hypertension Brother   . Diabetes Brother     Social History   Tobacco Use  . Smoking status: Current Every Day Smoker    Packs/day: 1.00    Types: Cigarettes  . Smokeless tobacco: Never Used  Vaping Use  . Vaping Use: Never used  Substance Use Topics  . Alcohol use: Not Currently    Comment: occ  . Drug use: Never  Home Medications Prior to Admission medications   Medication Sig Start Date End Date Taking? Authorizing Provider  enoxaparin (LOVENOX) 100 MG/ML injection Inject 0.9 mLs (90 mg total) into the skin every 12 (twelve) hours. Discard 0.1 mL (10 units) before using. 03/09/20   Tolia, Sunit, DO  allopurinol (ZYLOPRIM) 100 MG tablet Take 1 tablet by mouth daily.  04/24/19   [provider]  ALPRAZolam Prudy Feeler) 0.5 MG tablet Take 0.5 mg by mouth 2 (two) times daily as needed. 03/22/20   [provider]  atorvastatin (LIPITOR) 80 MG tablet Take 1 tablet (80 mg total) by mouth at bedtime. 01/12/20  07/10/20  Tolia, Sunit, DO  buPROPion (WELLBUTRIN SR) 150 MG 12 hr tablet Take 1 tablet by mouth 2 (two) times daily.  12/15/19   [provider]  Diclofenac Sodium 3 % GEL Apply topically. 12/25/19   [provider]  DULoxetine (CYMBALTA) 30 MG capsule Take 30 mg by mouth daily.    [provider]  ferrous sulfate 325 (65 FE) MG EC tablet Take 1 tablet by mouth daily. 05/14/19   [provider]  gabapentin (NEURONTIN) 600 MG tablet Take 1 capsule by mouth 3 (three) times daily.  05/05/19   [provider]  metoprolol succinate (TOPROL-XL) 25 MG 24 hr tablet TAKE 1 TABLET BY MOUTH ONCE DAILY *HOLD WHEN SYSTOLIC BLOOD PRESSEUR IS LESS THATN 100 OR HEARTRATE IS LESS THAN 60 BEATS PER MINUTE* 03/23/20   Tolia, Sunit, DO  oxyCODONE-acetaminophen (PERCOCET/ROXICET) 5-325 MG tablet Take 1 tablet by mouth 3 (three) times daily as needed. 01/21/20   [provider]  QUEtiapine (SEROQUEL) 400 MG tablet Take 400 mg by mouth at bedtime. 03/23/20   [provider]  sacubitril-valsartan (ENTRESTO) 49-51 MG Take 1 tablet by mouth 2 (two) times daily. 12/25/19   Tolia, Sunit, DO  SUTAB [phone] MG TABS TAKE 24 TABLETS BY MOUTH PER PREP TEST 02/22/20   [provider]  tiZANidine (ZANAFLEX) 4 MG tablet Take 4 mg by mouth 3 (three) times daily as needed. 03/04/20   [provider]  traZODone (DESYREL) 150 MG tablet Take 150 mg by mouth at bedtime.     [provider]  Vitamin D, Ergocalciferol, (DRISDOL) 1.25 MG (50000 UNIT) CAPS capsule Take 50,000 Units by mouth once a week. 12/24/19   [provider]  warfarin (COUMADIN) 3 MG tablet Take 1 tablet (3 mg total) by mouth daily. As  Directed; Take 4.5 mg every Tues and Thurs and 3 mg all other days. Refer to most recent anticoagulation note for most updated directions. 09/20/19   Tolia, Sunit, DO  zolpidem (AMBIEN) 5 MG tablet Take 5 mg by mouth at bedtime as needed. 02/09/20    [provider]    Allergies    Patient has no known allergies.  Review of Systems   Review of Systems  Constitutional: Negative for chills and fever.  HENT: Negative for ear pain and sore throat.   Eyes: Negative for pain and visual disturbance.  Respiratory: Negative for cough and shortness of breath.   Cardiovascular: Negative for chest pain and palpitations.  Gastrointestinal: Negative for abdominal pain and vomiting.  Genitourinary: Negative for dysuria and hematuria.  Musculoskeletal: Negative for arthralgias and back pain.  Skin: Negative for color change and rash.  Neurological: Negative for seizures, syncope and headaches.  All other systems reviewed and are negative.   Physical Exam Updated Vital Signs BP 129/71 (BP Location: Left Arm)   Pulse 71  Temp 98.9 F (37.2 C) (Oral)   Resp 15   SpO2 92%   Physical Exam Vitals and nursing note reviewed.  Constitutional:      General: She is awake. She is not in acute distress.    Appearance: Normal appearance. She is well-developed and well-groomed. She is not ill-appearing.  HENT:     Head: Normocephalic and atraumatic.     Right Ear: External ear normal.     Left Ear: External ear normal.     Nose: Nose normal.     Mouth/Throat:     Mouth: Mucous membranes are moist.     Pharynx: Oropharynx is clear. No oropharyngeal exudate or posterior oropharyngeal erythema.  Eyes:     General: No visual field deficit or scleral icterus.       Right eye: No discharge.        Left eye: No discharge.     Extraocular Movements: Extraocular movements intact.     Conjunctiva/sclera: Conjunctivae normal.     Pupils: Pupils are equal, round, and reactive to light.  Cardiovascular:     Rate and Rhythm: Normal rate and regular rhythm.     Pulses: Normal pulses.     Heart sounds: Normal heart sounds.  Pulmonary:     Effort: Pulmonary effort is normal. No respiratory distress.     Breath sounds: Normal breath sounds. No  wheezing, rhonchi or rales.  Chest:     Chest wall: No tenderness.  Abdominal:     General: Abdomen is flat. There is no distension.     Palpations: Abdomen is soft.     Tenderness: There is no abdominal tenderness. There is no guarding or rebound.  Musculoskeletal:        General: No tenderness or signs of injury.     Cervical back: No signs of trauma. No pain with movement, spinous process tenderness or muscular tenderness.     Right lower leg: No edema.     Left lower leg: No edema.     Comments: Extremities atraumatic. No C/T/L spine tenderness. Pelvis stable and nontender.  Skin:    General: Skin is warm and dry.     Findings: No rash.  Neurological:     General: No focal deficit present.     Mental Status: She is alert and oriented to person, place, and time.     GCS: GCS eye subscore is 4. GCS verbal subscore is 5. GCS motor subscore is 6.     Cranial Nerves: Cranial nerves are intact. No cranial nerve deficit, dysarthria or facial asymmetry.     Sensory: Sensation is intact. No sensory deficit.     Motor: Motor function is intact. No weakness or pronator drift.     Coordination: Coordination is intact. Finger-Nose-Finger Test normal.     Gait: Gait is intact. Gait normal.     Comments: Ambulatory safely with walking.  Psychiatric:        Behavior: Behavior is cooperative.     ED Results / Procedures / Treatments   Labs (all labs ordered are listed, but only abnormal results are displayed) Labs Reviewed - No data to display  EKG None  Radiology CT Head Wo Contrast  Result Date: 05/05/2020 CLINICAL DATA:  Lethargy, dizziness, occipital headache and blurred vision following a fall and hitting the back of her head on a coffee table four days ago. EXAM: CT HEAD WITHOUT CONTRAST TECHNIQUE: Contiguous axial images were obtained from the base of the skull through the  vertex without intravenous contrast. COMPARISON:  None. FINDINGS: Brain: Left temporal lobe encephalomalacia  and wedge-shaped area of encephalomalacia in the left parietal lobe. Mildly enlarged ventricles and cortical sulci. No intracranial hemorrhage, mass lesion or CT evidence of acute infarction. Vascular: No hyperdense vessel or unexpected calcification. Skull: Normal. Negative for fracture or focal lesion. Sinuses/Orbits: Unremarkable. Other: Left parietal fat density scalp lesion. IMPRESSION: 1. No acute abnormality. 2. Left temporal and parietal lobe encephalomalacia, compatible with old infarcts. 3. Mild diffuse cerebral and cerebellar atrophy. Electronically Signed   By: Beckie Salts M.D.   On: 05/05/2020 13:05    Procedures Procedures  Medications Ordered in ED Medications - No data to display  ED Course  I have reviewed the triage vital signs and the nursing notes.  Pertinent labs & imaging results that were available during my care of the patient were reviewed by me and considered in my medical decision making (see chart for details).    MDM Rules/Calculators/A&P                          Patient is a 59yoF w/ history and physical as described above who presents to the ED for fall 4 days ago. VS reassuring and HDS. No focal neuro deficits on exam and ambulating with cane safely in ED. Resting comfortably with no complaints at this time. CT head obtained that was unremarkable. Doubt ICH, CVA/TIA, space occupying lesion, spinal injury, intraabdominal trauma, intrathoracic injury, fracture, or dislocation. Recommend follow up with PCP on outpatient basis and continue to follow up with Coumadin clinic as needed. Strict return precautions provided and discussed. Questions and concerns addressed. Patient verbalized understanding and amenable with discharge plan. Discharged in stable condition.   Final Clinical Impression(s) / ED Diagnoses Final diagnoses:  Fall, initial encounter    Rx / DC Orders ED Discharge Orders    None       Tonia Brooms, MD 05/05/20 1659    Milagros Loll,  MD 05/07/20 [phone]

## 2020-05-05 NOTE — Patient Instructions (Signed)
INR below goal. Pt went to the hospital. Will recheck INR in 1 week.

## 2020-05-05 NOTE — Progress Notes (Signed)
Anticoagulation Management Joanna Martinez is a 60 y.o. female who reports to the clinic for monitoring of warfarin treatment.    Indication: atrial fibrillation and s/p [employer] mechanical mitral valve replacement in March 2002 ; CHA2DS2 Vasc Score 6 (Female, CHF hx, HTN hx, Stroke/TIA hx, valvular heart disease), HAS-BLED 1 (Stroke hx)    Duration: indefinite Supervising physician: Tessa Lerner  Anticoagulation Clinic Visit History:  Hx of multiple previous strokes. Most recent incidence 3 or 4 years ago. Pt also on Plavix for history of coronary artery disease status post drug-eluting stent in August 2019. Noteded major DDI b/w warfarin and plavix of increased bleeding risk. Noted DDI b/w warfarin and allopurinol about increased anticoagulant effect as well. Pt denies any recent allopurinol dose changes.Pt confirmed to have a tan colored (3 mg) warfarin tablet at home. Pt started taking warfarin in the evening as previously discussed.     Pt came to the office today with attesting to a recent mechanical fall on Sunday (05/01/20). Reports that she lost sensation in her feet and tripped forward and hit her head on the coffee table. Reports that she was on the floor for 15+ mins before someone was able to get her back to her feet. Pt never went to the ER for evaluation to rule out concerns of possible intracranial bleed in setting of head trauma in setting of warfarin therapy.   Pt attests to having concerns of persistent right sided temporal headache (site of injury), diplopia (has been chronic and unable to ascertain if any acute visual changes), recent complains of worsening gait instability.   Pt had a recent colonoscopy procedure on 04/20/20 and held warfarin and bridged with Lovenox. Pt has been following bridge directions as instructed with previous INR checked on 04/28/20 of 3.3. Lovenox was stopped and previously stable  Warfarin dose was recently restarted.   When assessing for stroke/TIA  like symptoms. Pt did attest to having complains of worsening motor/sensory complains with pt reported complains of gait instability, feeling like "her legs are about to give out", multiple episodes of dropping things from her hand. Pt notes that these motor/sensory complains are bilateral and have been progressively getting worse over the last few weeks. Pt reports that she recently went to her PCP and was asked to follow up with a neurologist for further work-up. Pt also attests to her family members noting that she was mumbling and having concerns of slurred speeches this Saturday. Pt has hx of repeat previous stroke/TIA like symptoms.   Pt also had a recent follow up with psychiatry for concerns of worsening depression symptoms. Per pt, pt was assessed for suicidal ideations. Pt was asked to make an urgent in-person visit for medication management. Pt reports having suicidal ideation in the past, but denies any current suicidal ideation. Denies any current plan or access to anything to implement any set plans. Pt denies any homicidal ideations. Reports that she had multiple recent family deaths and other triggers that she attributed to her worsening depression concerns.   Called pt's daughter per pt request and pt agreeable to go to the ER to rule out concerns for intracranial hemorrhage and/or stroke/TIA-like symptoms. Reviewed with Dr. Odis Hollingshead who assessed the pt and was agreeable with the plan for pt to get evaluated in the ED.   Anticoagulation Episode Summary    Current INR goal:  2.5-3.5  TTR:  60.8 % (9.7 mo)  Next INR check:  05/12/2020  INR from last check:  2.3 (05/05/2020)  Weekly max warfarin dose:    Target end date:  Indefinite  INR check location:    Preferred lab:    Send INR reminders to:     Indications   S/P mitral valve replacement [Z95.2] Paroxysmal atrial fibrillation (HCC) [I48.0] Monitoring for long-term anticoagulant use [Z51.81 Z79.01]       Comments:          No  Known Allergies  Current Outpatient Medications:  .  enoxaparin (LOVENOX) 100 MG/ML injection, Inject 0.9 mLs (90 mg total) into the skin every 12 (twelve) hours. Discard 0.1 mL (10 units) before using., Disp: 24 mL, Rfl: 1 .  allopurinol (ZYLOPRIM) 100 MG tablet, Take 1 tablet by mouth daily. , Disp: , Rfl:  .  ALPRAZolam (XANAX) 0.5 MG tablet, Take 0.5 mg by mouth 2 (two) times daily as needed., Disp: , Rfl:  .  atorvastatin (LIPITOR) 80 MG tablet, Take 1 tablet (80 mg total) by mouth at bedtime., Disp: 90 tablet, Rfl: 1 .  buPROPion (WELLBUTRIN SR) 150 MG 12 hr tablet, Take 1 tablet by mouth 2 (two) times daily. , Disp: , Rfl:  .  Diclofenac Sodium 3 % GEL, Apply topically., Disp: , Rfl:  .  DULoxetine (CYMBALTA) 30 MG capsule, Take 30 mg by mouth daily., Disp: , Rfl:  .  ferrous sulfate 325 (65 FE) MG EC tablet, Take 1 tablet by mouth daily., Disp: , Rfl:  .  gabapentin (NEURONTIN) 600 MG tablet, Take 1 capsule by mouth 3 (three) times daily. , Disp: , Rfl:  .  metoprolol succinate (TOPROL-XL) 25 MG 24 hr tablet, TAKE 1 TABLET BY MOUTH ONCE DAILY *HOLD WHEN SYSTOLIC BLOOD PRESSEUR IS LESS THATN 100 OR HEARTRATE IS LESS THAN 60 BEATS PER MINUTE*, Disp: 30 tablet, Rfl: 0 .  oxyCODONE-acetaminophen (PERCOCET/ROXICET) 5-325 MG tablet, Take 1 tablet by mouth 3 (three) times daily as needed., Disp: , Rfl:  .  QUEtiapine (SEROQUEL) 400 MG tablet, Take 400 mg by mouth at bedtime., Disp: , Rfl:  .  sacubitril-valsartan (ENTRESTO) 49-51 MG, Take 1 tablet by mouth 2 (two) times daily., Disp: 180 tablet, Rfl: 3 .  SUTAB [phone] MG TABS, TAKE 24 TABLETS BY MOUTH PER PREP TEST, Disp: , Rfl:  .  tiZANidine (ZANAFLEX) 4 MG tablet, Take 4 mg by mouth 3 (three) times daily as needed., Disp: , Rfl:  .  traZODone (DESYREL) 150 MG tablet, Take 150 mg by mouth at bedtime. , Disp: , Rfl:  .  Vitamin D, Ergocalciferol, (DRISDOL) 1.25 MG (50000 UNIT) CAPS capsule, Take 50,000 Units by mouth once a week., Disp: ,  Rfl:  .  warfarin (COUMADIN) 3 MG tablet, Take 1 tablet (3 mg total) by mouth daily. As  Directed; Take 4.5 mg every Tues and Thurs and 3 mg all other days. Refer to most recent anticoagulation note for most updated directions., Disp: 100 tablet, Rfl: 2 .  zolpidem (AMBIEN) 5 MG tablet, Take 5 mg by mouth at bedtime as needed., Disp: , Rfl:  Past Medical History:  Diagnosis Date  . Asthma   . Cardiomyopathy (HCC)   . CHF (congestive heart failure) (HCC)   . Chronic kidney disease   . COPD (chronic obstructive pulmonary disease) (HCC)   . Coronary artery disease   . H/O heart valve replacement with mechanical valve   . Heart attack (HCC)   . Hyperlipidemia   . Hypertension   . Paroxysmal atrial fibrillation (HCC)   . Stroke [facility])  ASSESSMENT  Recent Results: The most recent result is correlated with 25.5 mg per week:  Lab Results  Component Value Date   INR 2.3 05/05/2020   INR 3.3 (A) 04/28/2020   INR 1.6 (A) 04/25/2020    Anticoagulation Dosing: Description   INR below goal. Pt went to the hospital. Will recheck INR in 1 week.      INR today: Subtherapeutic    PLAN Weekly dose was unchanged by 0% to 25.5 mg/week. Had pt go to the ER for further assessment and evaluation. Will follow up with pt after ED workup.   Patient Instructions  INR below goal. Pt went to the hospital. Will recheck INR in 1 week.   Patient advised to contact clinic or seek medical attention if signs/symptoms of bleeding or thromboembolism occur.  Patient verbalized understanding by repeating back information and was advised to contact me if further medication-related questions arise.   Follow-up No follow-ups on file.  Leonides Schanz, PharmD  15 minutes spent face-to-face with the patient during the encounter. 50% of time spent on education, including signs/sx bleeding and clotting, as well as food and drug interactions with warfarin. 50% of time was spent on fingerprick POC INR sample  collection,processing, results determination, and documentation

## 2020-05-05 NOTE — Progress Notes (Deleted)
Anticoagulation Management Joanna Martinez is a 60 y.o. female who reports to the clinic for monitoring of warfarin treatment.    Indication: atrial fibrillation and s/p Saint Jude mechanical mitral valve replacement in March 2002 ; CHA2DS2 Vasc Score 6 (Female, CHF hx, HTN hx, Stroke/TIA hx, valvular heart disease), HAS-BLED 1 (Stroke hx)    Duration: indefinite Supervising physician: Tessa Lerner  Anticoagulation Clinic Visit History:.  Hx of multiple previous strokes. Most recent incidence 3 or 4 years ago. Pt also on Plavix for history of coronary artery disease status post drug-eluting stent in August 2019. Noteded major DDI b/w warfarin and plavix of increased bleeding risk. Noted DDI b/w warfarin and allopurinol about increased anticoagulant effect as well. Pt denies any recent allopurinol dose changes.Pt confirmed to have a tan colored (3 mg) warfarin tablet at home. Pt started taking warfarin in the evening as previously discussed.     Patient does not report signs/symptoms of bleeding or thromboembolism.   Other recent changes: No changes in medications or lifestyle.   Pt recovering well following recent colonoscopy. Followed bridge directions as directed. Reports waiting for biopsy results before confirming any follow-up work-up.   Anticoagulation Episode Summary    Current INR goal:  2.5-3.5  TTR:  60.4 % (9.4 mo)  Next INR check:  05/05/2020  INR from last check:  3.3 (04/28/2020)  Weekly max warfarin dose:    Target end date:  Indefinite  INR check location:    Preferred lab:    Send INR reminders to:     Indications   S/P mitral valve replacement [Z95.2] Paroxysmal atrial fibrillation (HCC) [I48.0] Monitoring for long-term anticoagulant use [Z51.81 Z79.01]       Comments:          No Known Allergies  Current Outpatient Medications:  .  enoxaparin (LOVENOX) 100 MG/ML injection, Inject 0.9 mLs (90 mg total) into the skin every 12 (twelve) hours. Discard 0.1 mL (10  units) before using., Disp: 24 mL, Rfl: 1 .  allopurinol (ZYLOPRIM) 100 MG tablet, Take 1 tablet by mouth daily. , Disp: , Rfl:  .  ALPRAZolam (XANAX) 0.5 MG tablet, Take 0.5 mg by mouth 2 (two) times daily as needed., Disp: , Rfl:  .  atorvastatin (LIPITOR) 80 MG tablet, Take 1 tablet (80 mg total) by mouth at bedtime., Disp: 90 tablet, Rfl: 1 .  buPROPion (WELLBUTRIN SR) 150 MG 12 hr tablet, Take 1 tablet by mouth 2 (two) times daily. , Disp: , Rfl:  .  Diclofenac Sodium 3 % GEL, Apply topically., Disp: , Rfl:  .  DULoxetine (CYMBALTA) 30 MG capsule, Take 30 mg by mouth daily., Disp: , Rfl:  .  ferrous sulfate 325 (65 FE) MG EC tablet, Take 1 tablet by mouth daily., Disp: , Rfl:  .  gabapentin (NEURONTIN) 600 MG tablet, Take 1 capsule by mouth 3 (three) times daily. , Disp: , Rfl:  .  metoprolol succinate (TOPROL-XL) 25 MG 24 hr tablet, TAKE 1 TABLET BY MOUTH ONCE DAILY *HOLD WHEN SYSTOLIC BLOOD PRESSEUR IS LESS THATN 100 OR HEARTRATE IS LESS THAN 60 BEATS PER MINUTE*, Disp: 30 tablet, Rfl: 0 .  oxyCODONE-acetaminophen (PERCOCET/ROXICET) 5-325 MG tablet, Take 1 tablet by mouth 3 (three) times daily as needed., Disp: , Rfl:  .  QUEtiapine (SEROQUEL) 400 MG tablet, Take 400 mg by mouth at bedtime., Disp: , Rfl:  .  sacubitril-valsartan (ENTRESTO) 49-51 MG, Take 1 tablet by mouth 2 (two) times daily., Disp: 180 tablet, Rfl: 3 .  SUTAB [phone] MG TABS, TAKE 24 TABLETS BY MOUTH PER PREP TEST, Disp: , Rfl:  .  tiZANidine (ZANAFLEX) 4 MG tablet, Take 4 mg by mouth 3 (three) times daily as needed., Disp: , Rfl:  .  traZODone (DESYREL) 150 MG tablet, Take 150 mg by mouth at bedtime. , Disp: , Rfl:  .  Vitamin D, Ergocalciferol, (DRISDOL) 1.25 MG (50000 UNIT) CAPS capsule, Take 50,000 Units by mouth once a week., Disp: , Rfl:  .  warfarin (COUMADIN) 3 MG tablet, Take 1 tablet (3 mg total) by mouth daily. As  Directed; Take 4.5 mg every Tues and Thurs and 3 mg all other days. Refer to most recent  anticoagulation note for most updated directions., Disp: 100 tablet, Rfl: 2 .  zolpidem (AMBIEN) 5 MG tablet, Take 5 mg by mouth at bedtime as needed., Disp: , Rfl:  Past Medical History:  Diagnosis Date  . Asthma   . Cardiomyopathy (HCC)   . CHF (congestive heart failure) (HCC)   . Chronic kidney disease   . COPD (chronic obstructive pulmonary disease) (HCC)   . Coronary artery disease   . H/O heart valve replacement with mechanical valve   . Heart attack (HCC)   . Hyperlipidemia   . Hypertension   . Paroxysmal atrial fibrillation (HCC)   . Stroke [facility])     ASSESSMENT  Recent Results: The most recent result is correlated with 25.5 mg per week:  Lab Results  Component Value Date   INR 3.3 (A) 04/28/2020   INR 1.6 (A) 04/25/2020   INR 2.4 04/12/2020    Anticoagulation Dosing: Description   INR at goal. Take 4.5 mg every Tues, Thurs, Sat, 3 mg all other days. Recheck INR in 1 week.      INR today: Therapeutic INR>2, will have pt discontinue Lovenox today. Denies any complains of bleeding or bruising symptoms. Denies any other relevant changes in her diet, medications, or lifestyle. Will have pt retry previous maintenance dose and continues close monitoring in setting recent post op INR changes.   PLAN Weekly dose was unchanged by 0% to 25.5 mg/week. Continue taking Lovenox 90 mcg Q12hrs and take warfarin 6 mg daily. Recheck INR in 3 days.   There are no Patient Instructions on file for this visit. Patient advised to contact clinic or seek medical attention if signs/symptoms of bleeding or thromboembolism occur.  Patient verbalized understanding by repeating back information and was advised to contact me if further medication-related questions arise.   Follow-up No follow-ups on file.  Leonides Schanz, PharmD  15 minutes spent face-to-face with the patient during the encounter. 50% of time spent on education, including signs/sx bleeding and clotting, as well as food and  drug interactions with warfarin. 50% of time was spent on fingerprick POC INR sample collection,processing, results determination, and documentation

## 2020-05-11 ENCOUNTER — Other Ambulatory Visit: Payer: Self-pay | Admitting: Pharmacist

## 2020-05-11 DIAGNOSIS — E782 Mixed hyperlipidemia: Secondary | ICD-10-CM

## 2020-05-11 DIAGNOSIS — Z955 Presence of coronary angioplasty implant and graft: Secondary | ICD-10-CM

## 2020-05-11 DIAGNOSIS — Z8673 Personal history of transient ischemic attack (TIA), and cerebral infarction without residual deficits: Secondary | ICD-10-CM

## 2020-05-11 DIAGNOSIS — I5022 Chronic systolic (congestive) heart failure: Secondary | ICD-10-CM

## 2020-05-11 DIAGNOSIS — I2510 Atherosclerotic heart disease of native coronary artery without angina pectoris: Secondary | ICD-10-CM

## 2020-05-15 MED ORDER — CLOPIDOGREL BISULFATE 75 MG PO TABS: 75.0000 mg | ORAL_TABLET | Freq: Every day | ORAL | 2 refills | Status: DC

## 2020-05-15 MED ORDER — ATORVASTATIN CALCIUM 80 MG PO TABS: 80.0000 mg | ORAL_TABLET | Freq: Every day | ORAL | 1 refills | Status: DC

## 2020-05-15 MED ORDER — METOPROLOL SUCCINATE ER 25 MG PO TB24: 25.0000 mg | ORAL_TABLET | Freq: Every day | ORAL | 2 refills | Status: DC

## 2020-05-15 MED ORDER — ENTRESTO 49-51 MG PO TABS: 1.0000 | ORAL_TABLET | Freq: Two times a day (BID) | ORAL | 2 refills | Status: DC

## 2020-05-19 ENCOUNTER — Ambulatory Visit: Payer: Medicaid Other | Admitting: Pharmacist

## 2020-05-19 ENCOUNTER — Other Ambulatory Visit: Payer: Self-pay

## 2020-05-19 DIAGNOSIS — Z5181 Encounter for therapeutic drug level monitoring: Secondary | ICD-10-CM

## 2020-05-19 DIAGNOSIS — Z952 Presence of prosthetic heart valve: Secondary | ICD-10-CM

## 2020-05-19 DIAGNOSIS — I480 Paroxysmal atrial fibrillation: Secondary | ICD-10-CM

## 2020-05-19 LAB — POCT INR: INR: 2.1 (ref 2.0–3.0)

## 2020-05-19 NOTE — Progress Notes (Signed)
Anticoagulation Management Joanna Martinez is a 60 y.o. female who reports to the clinic for monitoring of warfarin treatment.    Indication: atrial fibrillation and s/p Saint Jude mechanical mitral valve replacement in March 2002 ; CHA2DS2 Vasc Score 6 (Female, CHF hx, HTN hx, Stroke/TIA hx, valvular heart disease), HAS-BLED 1 (Stroke hx)    Duration: indefinite Supervising physician: Tessa Lerner  Anticoagulation Clinic Visit History:  Hx of multiple previous strokes. Most recent incidence 3 or 4 years ago. Pt also on Plavix for history of coronary artery disease status post drug-eluting stent in August 2019. Noteded major DDI b/w warfarin and plavix of increased bleeding risk. Noted DDI b/w warfarin and allopurinol about increased anticoagulant effect as well. Pt denies any recent allopurinol dose changes.Pt confirmed to have a tan colored (3 mg) warfarin tablet at home. Pt started taking warfarin in the evening as previously discussed.     Patient does not report signs/symptoms of bleeding or thromboembolism.   Other recent changes: No changes in medications or lifestyle.   Hospital visit notes reviewed. No evidence of ICH noted. Recent neurology appt on 05/17/20 for concerns of of numbness and pain in lower extremities. Recommended to f/u up with PCP for chronic disease management. Pt has an upcoming PCP OV next week.   Anticoagulation Episode Summary    Current INR goal:  2.5-3.5  TTR:  58.0 % (10.1 mo)  Next INR check:  06/02/2020  INR from last check:  2.1 (05/19/2020)  Weekly max warfarin dose:    Target end date:  Indefinite  INR check location:    Preferred lab:    Send INR reminders to:     Indications   S/P mitral valve replacement [Z95.2] Paroxysmal atrial fibrillation (HCC) [I48.0] Monitoring for long-term anticoagulant use [Z51.81 Z79.01]       Comments:          No Known Allergies  Current Outpatient Medications:  .  allopurinol (ZYLOPRIM) 100 MG tablet, Take 1  tablet by mouth daily. , Disp: , Rfl:  .  ALPRAZolam (XANAX) 0.5 MG tablet, Take 0.5 mg by mouth 2 (two) times daily as needed., Disp: , Rfl:  .  atorvastatin (LIPITOR) 80 MG tablet, Take 1 tablet (80 mg total) by mouth at bedtime., Disp: 90 tablet, Rfl: 1 .  buPROPion (WELLBUTRIN SR) 150 MG 12 hr tablet, Take 1 tablet by mouth 2 (two) times daily. , Disp: , Rfl:  .  clopidogrel (PLAVIX) 75 MG tablet, Take 1 tablet (75 mg total) by mouth daily., Disp: 90 tablet, Rfl: 2 .  Diclofenac Sodium 3 % GEL, Apply topically., Disp: , Rfl:  .  DULoxetine (CYMBALTA) 30 MG capsule, Take 30 mg by mouth daily., Disp: , Rfl:  .  ferrous sulfate 325 (65 FE) MG EC tablet, Take 1 tablet by mouth daily., Disp: , Rfl:  .  gabapentin (NEURONTIN) 600 MG tablet, Take 1 capsule by mouth 3 (three) times daily. , Disp: , Rfl:  .  metoprolol succinate (TOPROL-XL) 25 MG 24 hr tablet, Take 1 tablet (25 mg total) by mouth daily., Disp: 90 tablet, Rfl: 2 .  oxyCODONE-acetaminophen (PERCOCET/ROXICET) 5-325 MG tablet, Take 1 tablet by mouth 3 (three) times daily as needed., Disp: , Rfl:  .  QUEtiapine (SEROQUEL) 400 MG tablet, Take 400 mg by mouth at bedtime., Disp: , Rfl:  .  sacubitril-valsartan (ENTRESTO) 49-51 MG, Take 1 tablet by mouth 2 (two) times daily., Disp: 180 tablet, Rfl: 2 .  SUTAB [id number] MG TABS,  TAKE 24 TABLETS BY MOUTH PER PREP TEST, Disp: , Rfl:  .  tiZANidine (ZANAFLEX) 4 MG tablet, Take 4 mg by mouth 3 (three) times daily as needed., Disp: , Rfl:  .  traZODone (DESYREL) 150 MG tablet, Take 150 mg by mouth at bedtime. , Disp: , Rfl:  .  Vitamin D, Ergocalciferol, (DRISDOL) 1.25 MG (50000 UNIT) CAPS capsule, Take 50,000 Units by mouth once a week., Disp: , Rfl:  .  warfarin (COUMADIN) 3 MG tablet, Take 1 tablet (3 mg total) by mouth daily. As  Directed; Take 4.5 mg every Tues and Thurs and 3 mg all other days. Refer to most recent anticoagulation note for most updated directions., Disp: 100 tablet, Rfl: 2 .   zolpidem (AMBIEN) 5 MG tablet, Take 5 mg by mouth at bedtime as needed., Disp: , Rfl:  Past Medical History:  Diagnosis Date  . Asthma   . Cardiomyopathy (HCC)   . CHF (congestive heart failure) (HCC)   . Chronic kidney disease   . COPD (chronic obstructive pulmonary disease) (HCC)   . Coronary artery disease   . H/O heart valve replacement with mechanical valve   . Heart attack (HCC)   . Hyperlipidemia   . Hypertension   . Paroxysmal atrial fibrillation (HCC)   . Stroke [facility])     ASSESSMENT  Recent Results: The most recent result is correlated with 25.5 mg per week:  Lab Results  Component Value Date   INR 2.1 05/19/2020   INR 2.3 05/05/2020   INR 3.3 (A) 04/28/2020    Anticoagulation Dosing: Description   INR below goal. Take 6 mg today, then increase weekly dose to 3 mg every Mon, Wed and 4.5 mg all other days. Recheck INR in 2 weeks.      INR today: Subtherapeutic. INR continues to trend down. Pt denies any missed or skipped doses. Pt denies any other relevant changes in her diet, medications, or lifestyle that may contribute to her subtherapeutic INR reading. Will increase weekly dose and continue close monitoring.   PLAN Weekly dose was increased by 11.8 % to 28.5 mg/week. Take 6 mg today and then increase weekly dose to3 mg every Mon, Wed and 4.5 mg all other days. Recheck INR in 2 weeks.    Patient Instructions  INR below goal. Take 6 mg today, then increase weekly dose to 3 mg every Mon, Wed and 4.5 mg all other days. Recheck INR in 2 weeks.   Patient advised to contact clinic or seek medical attention if signs/symptoms of bleeding or thromboembolism occur.  Patient verbalized understanding by repeating back information and was advised to contact me if further medication-related questions arise.   Follow-up Return in about 2 weeks (around 06/02/2020).  Leonides Schanz, PharmD  15 minutes spent face-to-face with the patient during the encounter. 50% of time  spent on education, including signs/sx bleeding and clotting, as well as food and drug interactions with warfarin. 50% of time was spent on fingerprick POC INR sample collection,processing, results determination, and documentation

## 2020-05-19 NOTE — Patient Instructions (Signed)
INR below goal. Take 6 mg today, then increase weekly dose to 3 mg every Mon, Wed and 4.5 mg all other days. Recheck INR in 2 weeks.

## 2020-06-03 ENCOUNTER — Other Ambulatory Visit: Payer: Self-pay

## 2020-06-03 ENCOUNTER — Ambulatory Visit: Payer: Medicaid Other | Admitting: Cardiology

## 2020-06-03 ENCOUNTER — Encounter: Payer: Self-pay | Admitting: Cardiology

## 2020-06-03 ENCOUNTER — Ambulatory Visit: Payer: Medicaid Other

## 2020-06-03 ENCOUNTER — Ambulatory Visit: Payer: Medicaid Other | Admitting: Pharmacist

## 2020-06-03 VITALS — BP 107/71 | HR 75 | Resp 16 | Ht 60.0 in | Wt 212.6 lb

## 2020-06-03 DIAGNOSIS — Z955 Presence of coronary angioplasty implant and graft: Secondary | ICD-10-CM

## 2020-06-03 DIAGNOSIS — I2510 Atherosclerotic heart disease of native coronary artery without angina pectoris: Secondary | ICD-10-CM

## 2020-06-03 DIAGNOSIS — R0602 Shortness of breath: Secondary | ICD-10-CM

## 2020-06-03 DIAGNOSIS — I5023 Acute on chronic systolic (congestive) heart failure: Secondary | ICD-10-CM

## 2020-06-03 DIAGNOSIS — M7989 Other specified soft tissue disorders: Secondary | ICD-10-CM

## 2020-06-03 DIAGNOSIS — E782 Mixed hyperlipidemia: Secondary | ICD-10-CM

## 2020-06-03 DIAGNOSIS — Z8679 Personal history of other diseases of the circulatory system: Secondary | ICD-10-CM

## 2020-06-03 DIAGNOSIS — Z952 Presence of prosthetic heart valve: Secondary | ICD-10-CM

## 2020-06-03 DIAGNOSIS — Z7901 Long term (current) use of anticoagulants: Secondary | ICD-10-CM

## 2020-06-03 DIAGNOSIS — Z5181 Encounter for therapeutic drug level monitoring: Secondary | ICD-10-CM

## 2020-06-03 DIAGNOSIS — Z720 Tobacco use: Secondary | ICD-10-CM

## 2020-06-03 DIAGNOSIS — I10 Essential (primary) hypertension: Secondary | ICD-10-CM

## 2020-06-03 DIAGNOSIS — Z8673 Personal history of transient ischemic attack (TIA), and cerebral infarction without residual deficits: Secondary | ICD-10-CM

## 2020-06-03 DIAGNOSIS — I255 Ischemic cardiomyopathy: Secondary | ICD-10-CM

## 2020-06-03 DIAGNOSIS — I480 Paroxysmal atrial fibrillation: Secondary | ICD-10-CM

## 2020-06-03 LAB — POCT INR: INR: 2.6 (ref 2.0–3.0)

## 2020-06-03 MED ORDER — DAPAGLIFLOZIN PROPANEDIOL 10 MG PO TABS: 10.0000 mg | ORAL_TABLET | Freq: Every day | ORAL | 0 refills | Status: DC

## 2020-06-03 MED ORDER — FUROSEMIDE 20 MG PO TABS: 10.0000 mg | ORAL_TABLET | Freq: Every morning | ORAL | 0 refills | Status: DC

## 2020-06-03 NOTE — Progress Notes (Signed)
Anticoagulation Management Joanna Martinez is a 60 y.o. female who reports to the clinic for monitoring of warfarin treatment.    Indication: atrial fibrillation and s/p Saint Jude mechanical mitral valve replacement in March 2002 ; CHA2DS2 Vasc Score 6 (Female, CHF hx, HTN hx, Stroke/TIA hx, valvular heart disease), HAS-BLED 1 (Stroke hx)    Duration: indefinite Supervising physician: Tessa Lerner  Anticoagulation Clinic Visit History:  Hx of multiple previous strokes. Most recent incidence 3 or 4 years ago. Pt also on Plavix for history of coronary artery disease status post drug-eluting stent in August 2019. Noteded major DDI b/w warfarin and plavix of increased bleeding risk. Noted DDI b/w warfarin and allopurinol about increased anticoagulant effect as well. Pt denies any recent allopurinol dose changes.Pt confirmed to have a tan colored (3 mg) warfarin tablet at home. Pt started taking warfarin in the evening as previously discussed.     Patient does not report signs/symptoms of bleeding or thromboembolism.   Other recent changes: No changes in medications or lifestyle. Increased swelling and weight gain. Pt saw Dr. Odis Hollingshead to review extensive healthcare needs.   Anticoagulation Episode Summary    Current INR goal:  2.5-3.5  TTR:  56.3 % (10.6 mo)  Next INR check:  06/16/2020  INR from last check:  2.6 (06/03/2020)  Weekly max warfarin dose:    Target end date:  Indefinite  INR check location:    Preferred lab:    Send INR reminders to:     Indications   S/P mitral valve replacement [Z95.2] Paroxysmal atrial fibrillation (HCC) [I48.0] Monitoring for long-term anticoagulant use [Z51.81 Z79.01]       Comments:          No Known Allergies  Current Outpatient Medications:  .  allopurinol (ZYLOPRIM) 100 MG tablet, Take 1 tablet by mouth daily. , Disp: , Rfl:  .  ALPRAZolam (XANAX) 0.5 MG tablet, Take 0.5 mg by mouth 2 (two) times daily as needed., Disp: , Rfl:  .  atorvastatin  (LIPITOR) 80 MG tablet, Take 1 tablet (80 mg total) by mouth at bedtime., Disp: 90 tablet, Rfl: 1 .  buPROPion (WELLBUTRIN SR) 150 MG 12 hr tablet, Take 1 tablet by mouth 2 (two) times daily. , Disp: , Rfl:  .  clopidogrel (PLAVIX) 75 MG tablet, Take 1 tablet (75 mg total) by mouth daily., Disp: 90 tablet, Rfl: 2 .  dapagliflozin propanediol (FARXIGA) 10 MG TABS tablet, Take 1 tablet (10 mg total) by mouth daily before breakfast., Disp: 90 tablet, Rfl: 0 .  Diclofenac Sodium 3 % GEL, Apply topically., Disp: , Rfl:  .  DULoxetine (CYMBALTA) 30 MG capsule, Take 30 mg by mouth daily., Disp: , Rfl:  .  ferrous sulfate 325 (65 FE) MG EC tablet, Take 1 tablet by mouth daily., Disp: , Rfl:  .  furosemide (LASIX) 20 MG tablet, Take 0.5 tablets (10 mg total) by mouth every morning., Disp: 45 tablet, Rfl: 0 .  gabapentin (NEURONTIN) 600 MG tablet, Take 1 capsule by mouth 3 (three) times daily. , Disp: , Rfl:  .  metoprolol succinate (TOPROL-XL) 25 MG 24 hr tablet, Take 1 tablet (25 mg total) by mouth daily., Disp: 90 tablet, Rfl: 2 .  oxyCODONE-acetaminophen (PERCOCET/ROXICET) 5-325 MG tablet, Take 1 tablet by mouth 3 (three) times daily as needed., Disp: , Rfl:  .  QUEtiapine (SEROQUEL) 400 MG tablet, Take 400 mg by mouth at bedtime., Disp: , Rfl:  .  sacubitril-valsartan (ENTRESTO) 49-51 MG, Take 1 tablet  by mouth 2 (two) times daily., Disp: 180 tablet, Rfl: 2 .  SUTAB [phone] MG TABS, TAKE 24 TABLETS BY MOUTH PER PREP TEST, Disp: , Rfl:  .  tiZANidine (ZANAFLEX) 4 MG tablet, Take 4 mg by mouth 3 (three) times daily as needed., Disp: , Rfl:  .  traZODone (DESYREL) 150 MG tablet, Take 150 mg by mouth at bedtime. , Disp: , Rfl:  .  Vitamin D, Ergocalciferol, (DRISDOL) 1.25 MG (50000 UNIT) CAPS capsule, Take 50,000 Units by mouth once a week., Disp: , Rfl:  .  warfarin (COUMADIN) 3 MG tablet, Take 1 tablet (3 mg total) by mouth daily. As  Directed; Take 4.5 mg every Tues and Thurs and 3 mg all other days.  Refer to most recent anticoagulation note for most updated directions. (Patient taking differently: Take 3 mg by mouth daily. As  Directed; 3 mg every Mon, Wed; 4.5 mg all other days. Refer to most recent anticoagulation note for most updated directions.), Disp: 100 tablet, Rfl: 2 .  zolpidem (AMBIEN) 5 MG tablet, Take 5 mg by mouth at bedtime as needed., Disp: , Rfl:  Past Medical History:  Diagnosis Date  . Asthma   . Cardiomyopathy (HCC)   . CHF (congestive heart failure) (HCC)   . Chronic kidney disease   . COPD (chronic obstructive pulmonary disease) (HCC)   . Coronary artery disease   . H/O heart valve replacement with mechanical valve   . Heart attack (HCC)   . Hyperlipidemia   . Hypertension   . Paroxysmal atrial fibrillation (HCC)   . Stroke [facility]     ASSESSMENT  Recent Results: The most recent result is correlated with 28.5 mg per week:  Lab Results  Component Value Date   INR 2.6 06/03/2020   INR 2.1 05/19/2020   INR 2.3 05/05/2020    Anticoagulation Dosing: Description   INR at goal. Increase weekly dose to 3 mg every Wed and 4.5 mg all other days. Recheck INR in 2 weeks.      INR today: Therapeutic. Following recent weekly dose increase. Pt denies any missed or skipped doses. Pt denies any other relevant changes in her diet, medications, or lifestyle. Denies any unusual bleeding and bruising symptoms. Recent increased swelling may be associated with decreased anticoagulation effect. Will continue close monitoring and follow up.   PLAN Weekly dose was increased by 5.3 % to 30 mg/week. Increase weekly dose to 3 mg every Wed and 4.5 mg all other days. Recheck INR in 2 weeks.    Patient Instructions  INR at goal. Increase weekly dose to 3 mg every Wed; 4.5 mg all other days. Recheck INR in 1 week.   Patient advised to contact clinic or seek medical attention if signs/symptoms of bleeding or thromboembolism occur.  Patient verbalized understanding by repeating  back information and was advised to contact me if further medication-related questions arise.   Follow-up Return in about 13 days (around 06/16/2020).  Leonides Schanz, PharmD  15 minutes spent face-to-face with the patient during the encounter. 50% of time spent on education, including signs/sx bleeding and clotting, as well as food and drug interactions with warfarin. 50% of time was spent on fingerprick POC INR sample collection,processing, results determination, and documentation

## 2020-06-03 NOTE — Patient Instructions (Addendum)
INR at goal. Increase weekly dose to 3 mg every Wed; 4.5 mg all other days. Recheck INR in 1 week.

## 2020-06-03 NOTE — Progress Notes (Signed)
Devoria Martinez Date of Birth: [birth year] MRN: [id number] Primary Care Provider:[place], Alfonso Ramus, [place] Former Cardiology Providers: Dr. Thayer Jew [phone] Primary Cardiologist: Rex Kras, DO, [facility] (established care 07/02/2019)  Date: 06/03/20 Last Office Visit: 01/12/2020  Chief Complaint  Patient presents with  . Shortness of Breath  . Edema  . Follow-up    HPI  Joanna Martinez is a 60 y.o. 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   Referred to the office to establish care given her history of coronary artery disease and status post mechanical mitral valve replacement at the request of her primary care physician. Based on prior records obtained patient has history of atherosclerotic coronary artery disease with prior PCI to LCX and SJM mechanical mitral valve replacement in 05/2000 at [facility] for rheumatic mitral stenosis on long term [place] with coumadin. Last heart cath was in November 2019 due to angina which demonstrated scattered moderate non-obstructive disease with 65% stenosis of the ostial RPDA and medical therapy was recommended.  Echocardiogram in May 2021 noted moderately reduced left-ventricular systolic function at 30 to 35%, moderately dilated left atrium, functional mitral mechanical valve prosthesis.    Patient came in for an INR visit and requested to be seen as she has been  experiencing shortness of breath and lower extremity swelling.  Patient has gained approximately 5 pounds since last office INR check which was 4 weeks ago.  Patient states that she is compliant with her medical therapy; however, does not have a list of her medications with her or her medication bottles for an accurate medication reconciliation.  Dietary compliance is challenging as she is unable to cook and relies on her neighbors to make her meals.  Her diet is high in canned foods and does not have the monetary means to get healthier food.  She denies active chest pain.  But does have left arm pain and she attributes the left arm pain due to sleeping on the couch as she currently does not have a bed.  The arm pain usually resolves after waking up but can still surface throughout the day.  No associated angina pectoris.  She also has gait abnormality and has been following up with neurology and no additional work-up is recommended at this time.  Patient states that she has a follow-up appointment to see them in 6 months.  FUNCTIONAL STATUS: No exercise or daily routine.    ALLERGIES: No Known Allergies  MEDICATION LIST PRIOR TO VISIT: Current Meds  Medication Sig  . dapagliflozin propanediol (FARXIGA) 10 MG TABS tablet Take 1 tablet (10 mg total) by mouth daily before breakfast.  . furosemide (LASIX) 20 MG tablet Take 0.5 tablets (10 mg total) by mouth every morning.     PAST MEDICAL HISTORY: Past Medical History:  Diagnosis Date  . Asthma   . Cardiomyopathy ([place])   . CHF (congestive heart failure) ([place])   . Chronic kidney disease   . COPD (chronic obstructive pulmonary disease) ([place])   . Coronary artery disease   .  H/O heart valve replacement with mechanical valve   . Heart attack ([place])   . Hyperlipidemia   . Hypertension   . Paroxysmal atrial fibrillation (HCC)   . Stroke [facility])     PAST SURGICAL HISTORY: Past Surgical History:  Procedure Laterality Date  . CARDIAC SURGERY     . CORONARY ANGIOPLASTY WITH STENT PLACEMENT    . MITRAL VALVE SURGERY      FAMILY HISTORY: The patient family history includes Diabetes in her brother, brother, and sister; HIV/AIDS in her father and mother; Hyperlipidemia in her brother, brother, and sister; Hypertension in her brother, brother, and sister.  SOCIAL HISTORY:  The patient  reports that she has been smoking cigarettes. She has been smoking about 1.00 pack per day. She has never used smokeless tobacco. She reports previous alcohol use. She reports that she does not use drugs.  REVIEW OF SYSTEMS: Review of Systems  Constitutional: Negative for chills and fever.  HENT: Negative for hoarse voice and nosebleeds.   Eyes: Negative for discharge, double vision and pain.  Cardiovascular: Positive for leg swelling. Negative for chest pain, claudication, dyspnea on exertion, near-syncope, orthopnea, palpitations, paroxysmal nocturnal dyspnea and syncope.  Respiratory: Positive for shortness of breath. Negative for hemoptysis.   Musculoskeletal: Negative for muscle cramps and myalgias.  Gastrointestinal: Negative for abdominal pain, constipation, diarrhea, hematemesis, hematochezia, melena, nausea and vomiting.  Neurological: Positive for loss of balance (Instability with gait) and paresthesias. Negative for dizziness, focal weakness and light-headedness.    PHYSICAL EXAM: Vitals with BMI 06/03/2020 05/05/2020 05/05/2020  Height 5' 0"  - -  Weight 212 lbs 10 oz - -  BMI 76.72 - -  Systolic 094 709 95  Diastolic 71 71 55  Pulse 75 71 70   CONSTITUTIONAL: Well-developed and well-nourished. No acute distress.  SKIN: Skin is warm and dry. No rash noted. No cyanosis. No pallor. No jaundice HEAD: Normocephalic and atraumatic.  EYES: No scleral icterus MOUTH/THROAT: Moist oral membranes.  NECK: No JVD present. No thyromegaly noted. No carotid bruits   LYMPHATIC: No visible cervical adenopathy.  CHEST Normal respiratory effort. No  intercostal retractions  LUNGS: Clear to auscultation bilaterally. No stridor. No wheezes. No rales.  CARDIOVASCULAR: Regular rate and rhythm, positive G2-E3, mechanical click heard at the apex, no murmurs rubs or gallops appreciated. ABDOMINAL: Obese, soft, nontender, nondistended, positive bowel sounds all 4 quadrants. No apparent ascites.  EXTREMITIES: No peripheral edema  HEMATOLOGIC: No significant bruising NEUROLOGIC: Oriented to person, place, and time. Nonfocal. Normal muscle tone.  Cranial nerves 2-12 grossly intact.   PSYCHIATRIC: Normal mood and affect. Normal behavior. Cooperative   CARDIAC DATABASE: Status post SJM mechanical mitral valve placement in March 2002 at [facility] due to rheumatic mitral stenosis.  EKG: 07/02/2019: Sinus rhythm, 94 bpm, normal axis, incomplete right bundle branch block, left atrial enlargement, nonspecific T wave abnormality.   06/03/2020: Normal sinus rhythm, 66 bpm, left axis deviation, without underlying injury pattern, occasional PVCs.  Echocardiogram: 04/13/2019 outside records: LVEF 66-29%, grade 1 diastolic impairment, moderately global hypokinesis.  Mechanical mitral valve present.  Valve function appears normal.  No evidence of mitral valve stenosis or regurgitation.  Mitral valve pressure halftime 38 ms.  07/10/2019: Moderately depressed LV systolic function with visual EF 30-35%. Left ventricle cavity is normal in size. Normal global wall motion. Indeterminate diastolic filling pattern. Calculated EF 35%. Left atrial cavity is moderately dilated at 4.5 cm. Mechanical mitral valve.  Trace mitral regurgitation, withing physiologic limits.  Normal mitral valve  leaflet mobility. Peak PR of 4.6 and mean PG of 4 mm Hg. Trace tricuspid regurgitation. No evidence of pulmonary hypertension.   Stress Testing: We will obtain outside records.  Heart Catheterization: November 2019 per report: Proximal LAD 20%. LCx stent patent.  Discrete 20%  narrowing noted distal to the stent.  A separate 30% stenosis was noted in the distal LCx. RCA: Diffuse 20-30% narrowing in the proximal/mid/distal segments. Posterior atrioventricular branch 50 to 60% stenosis in its distal segment prior to the origin of the most distal posterolateral branch. Right PDA smooth tubular lesions with 60 to 70% luminal narrowing.    LABORATORY DATA: No flowsheet data found.  CMP Latest Ref Rng & Units 02/05/2020 01/19/2020 01/01/2020  Glucose 65 - 99 mg/dL 109(H) 106(H) 137(H)  BUN 8 - 27 mg/dL 8 5(L) 7(L)  Creatinine 0.57 - 1.00 mg/dL 0.99 1.07(H) 0.83  Sodium 134 - 144 mmol/L 140 142 141  Potassium 3.5 - 5.2 mmol/L 4.3 4.3 3.9  Chloride 96 - 106 mmol/L 106 104 105  CO2 20 - 29 mmol/L 19(L) 23 21  Calcium 8.7 - 10.3 mg/dL 9.0 9.8 9.1    Lipid Panel  No results found for: CHOL, TRIG, HDL, CHOLHDL, VLDL, LDLCALC, LDLDIRECT, LABVLDL  No results found for: HGBA1C No components found for: NTPROBNP No results found for: TSH  BMP Recent Labs    01/01/20 1130 01/19/20 1113 02/05/20 1053  NA 141 142 140  K 3.9 4.3 4.3  CL 105 104 106  CO2 21 23 19*  GLUCOSE 137* 106* 109*  BUN 7* 5* 8  CREATININE 0.83 1.07* 0.99  CALCIUM 9.1 9.8 9.0  GFRNONAA 77 57* 62  GFRAA 89 65 72    CBC No results for input(s): WBC, RBC, HGB, HCT, PLT, MCV, MCH, MCHC, RDW, LYMPHSABS, MONOABS, EOSABS, BASOSABS in the last 168 hours.  Invalid input(s): NEUTRABS  HEMOGLOBIN A1C No results found for: HGBA1C, MPG  Cardiac Panel (last 3 results) No results for input(s): CKTOTAL, CKMB, TROPONINI, RELINDX in the last 8760 hours. No results for input(s): TROPIPOC in the last 8760 hours.  BNP (last 3 results) Recent Labs    01/01/20 1129 01/19/20 1113 02/05/20 1053  PROBNP 352* 360* 200    TSH No results for input(s): TSH in the last 8760 hours.  CHOLESTEROL No results for input(s): CHOL in the last 8760 hours.  Hepatic Function Panel No results for input(s):  PROT, ALBUMIN, AST, ALT, ALKPHOS, BILITOT, BILIDIR, IBILI in the last 8760 hours.   External Labs: Collected: 06/10/2019 Creatinine 1.06 mg/dL. eGFR: 66 mL/min per 1.73 m Lipid profile: Total cholesterol 304, triglycerides 310, HDL 53, LDL 190 Hemoglobin A1c: 6.3 TSH: 1.26 and T4 0.68 (free)  IMPRESSION:    ICD-10-CM   1. Acute on chronic HFrEF (heart failure with reduced ejection fraction) (HCC)  I50.23 dapagliflozin propanediol (FARXIGA) 10 MG TABS tablet    Basic metabolic panel    Magnesium    Pro b natriuretic peptide (BNP)  2. SOB (shortness of breath)  R06.02 EKG 12-Lead    furosemide (LASIX) 20 MG tablet  3. Leg swelling  M79.89 furosemide (LASIX) 20 MG tablet    PCV LOWER VENOUS US (BILATERAL)  4. Hx of mitral valve replacement with mechanical valve  Z95.2   5. Hx of rheumatic fever  Z86.79   6. Paroxysmal atrial fibrillation (HCC)  I48.0   7. Monitoring for long-term anticoagulant use  Z51.81    Z79.01   8. Mixed hyperlipidemia  E78.2  9. Atherosclerosis of native coronary artery of native heart without angina pectoris  I25.10   10. History of coronary angioplasty with insertion of stent  Z95.5   11. Ischemic cardiomyopathy  I25.5   12. History of stroke  Z86.73   13. Benign hypertension  I10   14. Tobacco use  Z72.0      RECOMMENDATIONS: Biannca Scantlin is a 60 y.o. 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  Atherosclerotic coronary artery disease with prior PCI without angina:  No  active chest pain but she does complain of left arm pain which may be musculoskeletal in origin but given her underlying coronary artery disease progressive CAD cannot be ruled out.  EKG shows normal sinus rhythm without underlying injury pattern.    No active chest pain   Patient will be scheduled for Lexiscan to evaluate for reversible ischemia.    Acute on chronic HFrEF:  Seen urgently today due to her symptoms.   Has gained 13 pounds since last office visit.  Medications reconciled, based on the patient's memory.  She did not bring in a list or her medications at today's visit   Start Farxiga 10 mg p.o. daily.   Blood work in 1 week to evaluate kidney function and electrolytes.    Reemphasize importance of a low-salt diet.    We will have an echocardiogram performed prior to the next office visit.  Patient was scheduled for an echo around July 02, 2020.    Fluid restriction to <2L per day, Na restriction < 2g per day  Currently in principle care management for HF management.  Ischemic cardiomyopathy: See above  Atrial fibrillation, paroxysmal: . Rate control: metoprolol . Rhythm control: N/A . Thromboembolic prophylaxis: Currently on coumadin goal INR 2.5 to 3.5. Today's INR is 2.6  . CHA2DS2-VASc SCORE is 6 which correlates to 9.8 % risk of stroke per year. . Patient does not endorse any evidence of bleeding.  Long--term oral anticoagulation:   Indication: Paroxysmal atrial fibrillation and mechanical mitral valve.  Currently on Coumadin.  Patient does not endorse any evidence of bleeding.  INR at today's office 2.6  Status post [facility] mechanical mitral valve secondary to rheumatic mitral valve stenosis in  March 2002:  Continue coumadin.  Left lower extremity swelling:  The left lower extremity appears to be more swollen than the right.  Already on Coumadin for underlying mechanical valve as well as paroxysmal A. fib.  We will check a lower  extremity duplex to rule out DVT.  Active tobacco smoker:   Tobacco cessation counseling: Currently smoking 5 cigarettes per day   Patient was informed of the dangers of tobacco abuse including stroke, cancer, and MI, as well as benefits of tobacco cessation. Patient is willing to quit at this time. Approximately 5 mins were spent counseling patient cessation techniques. We discussed various methods to help quit smoking, including deciding on a date to quit, joining a support group, pharmacological agents- nicotine gum/patch/lozenges.  I will reassess her progress at the next follow-up visit  Hyperlipidemia:  Currently managed by primary team.  Continue Lipitor 40 mg p.o. nightly.  Patient does not endorse any myalgias.  Refill provided.  FINAL MEDICATION LIST END OF ENCOUNTER: Meds ordered this encounter  Medications  . dapagliflozin propanediol (FARXIGA) 10 MG TABS tablet    Sig: Take 1 tablet (10 mg total) by mouth daily before breakfast.    Dispense:  90 tablet    Refill:  0  . furosemide (LASIX) 20 MG tablet    Sig: Take 0.5 tablets (10 mg total) by mouth every morning.    Dispense:  45 tablet    Refill:  0     Current Outpatient Medications:  .  dapagliflozin propanediol (FARXIGA) 10 MG TABS tablet, Take 1 tablet (10 mg total) by mouth daily before breakfast., Disp: 90 tablet, Rfl: 0 .  furosemide (LASIX) 20 MG tablet, Take 0.5 tablets (10 mg total) by mouth every morning., Disp: 45 tablet, Rfl: 0 .  allopurinol (ZYLOPRIM) 100 MG tablet, Take 1 tablet by mouth daily. , Disp: , Rfl:  .  ALPRAZolam (XANAX) 0.5 MG tablet, Take 0.5 mg by mouth 2 (two) times daily as needed., Disp: , Rfl:  .  atorvastatin (LIPITOR) 80 MG tablet, Take 1 tablet (80 mg total) by mouth at bedtime., Disp: 90 tablet, Rfl: 1 .  buPROPion (WELLBUTRIN SR) 150 MG 12 hr tablet, Take 1 tablet by mouth 2 (two) times daily. , Disp: , Rfl:  .  clopidogrel (PLAVIX) 75 MG tablet, Take 1 tablet (75 mg total) by  mouth daily., Disp: 90 tablet, Rfl: 2 .  Diclofenac Sodium 3 % GEL, Apply topically., Disp: , Rfl:  .  DULoxetine (CYMBALTA) 30 MG capsule, Take 30 mg by mouth daily., Disp: , Rfl:  .  ferrous sulfate 325 (65 FE) MG EC tablet, Take 1 tablet by mouth daily., Disp: , Rfl:  .  gabapentin (NEURONTIN) 600 MG tablet, Take 1 capsule by mouth 3 (three) times daily. , Disp: , Rfl:  .  metoprolol succinate (TOPROL-XL) 25 MG 24 hr tablet, Take 1 tablet (25 mg total) by mouth daily., Disp: 90 tablet, Rfl: 2 .  oxyCODONE-acetaminophen (PERCOCET/ROXICET) 5-325 MG tablet, Take 1 tablet by mouth 3 (three) times daily as needed., Disp: , Rfl:  .  QUEtiapine (SEROQUEL) 400 MG tablet, Take 400 mg by mouth at bedtime., Disp: , Rfl:  .  sacubitril-valsartan (ENTRESTO) 49-51 MG, Take 1 tablet by mouth 2 (two) times daily., Disp: 180 tablet, Rfl: 2 .  SUTAB [phone] MG TABS, TAKE 24 TABLETS BY MOUTH PER PREP TEST, Disp: , Rfl:  .  tiZANidine (ZANAFLEX) 4 MG tablet, Take 4 mg by mouth 3 (three) times daily as needed., Disp: , Rfl:  .  traZODone (DESYREL) 150 MG tablet, Take 150 mg by mouth at bedtime. , Disp: , Rfl:  .  Vitamin D, Ergocalciferol, (DRISDOL) 1.25 MG (50000 UNIT) CAPS capsule, Take 50,000 Units by mouth once a week., Disp: , Rfl:  .  warfarin (COUMADIN) 3 MG tablet, Take 1 tablet (3 mg total) by mouth daily. As  Directed; Take 4.5 mg every Tues and Thurs and 3 mg all other days. Refer to most recent anticoagulation note for most updated directions. (Patient taking differently: Take 3 mg by mouth daily. As  Directed; 3 mg every Mon, Wed; 4.5 mg all other days. Refer to most recent anticoagulation note for most updated directions.), Disp: 100 tablet, Rfl: 2 .  zolpidem (AMBIEN) 5 MG tablet, Take 5 mg by mouth at bedtime as needed., Disp: , Rfl:   Orders Placed This Encounter  Procedures  .  Basic metabolic panel  . Magnesium  . Pro b natriuretic peptide (BNP)  . EKG 12-Lead  . PCV LOWER VENOUS US  (BILATERAL)    There are no Patient Instructions on file for this visit.   --Continue cardiac medications as reconciled in final medication list. --Return in about 4 weeks (around 07/01/2020) for Follow up, Dyspnea, heart failure management.. Or sooner if needed. --Continue follow-up with your primary care physician regarding the management of your other chronic comorbid conditions.  Patient's questions and concerns were addressed to her satisfaction. She voices understanding of the instructions provided during this encounter.   This note was created using a voice recognition software as a result there may be grammatical errors inadvertently enclosed that do not reflect the nature of this encounter. Every attempt is made to correct such errors.   Rex Kras, [place], [facility]  Pager: [phone] Office: [phone]

## 2020-06-05 ENCOUNTER — Other Ambulatory Visit: Payer: Self-pay

## 2020-06-05 ENCOUNTER — Emergency Department (HOSPITAL_BASED_OUTPATIENT_CLINIC_OR_DEPARTMENT_OTHER)
Admission: EM | Admit: 2020-06-05 | Discharge: 2020-06-05 | Disposition: A | Discharge status: Home / Self Care | Payer: Medicaid Other | Attending: Emergency Medicine | Admitting: Emergency Medicine

## 2020-06-05 ENCOUNTER — Encounter (HOSPITAL_BASED_OUTPATIENT_CLINIC_OR_DEPARTMENT_OTHER): Payer: Self-pay | Admitting: *Deleted

## 2020-06-05 ENCOUNTER — Emergency Department (HOSPITAL_BASED_OUTPATIENT_CLINIC_OR_DEPARTMENT_OTHER): Payer: Medicaid Other

## 2020-06-05 DIAGNOSIS — M7989 Other specified soft tissue disorders: Secondary | ICD-10-CM | POA: Diagnosis not present

## 2020-06-05 DIAGNOSIS — I509 Heart failure, unspecified: Secondary | ICD-10-CM | POA: Diagnosis not present

## 2020-06-05 DIAGNOSIS — I2510 Atherosclerotic heart disease of native coronary artery without angina pectoris: Secondary | ICD-10-CM | POA: Diagnosis not present

## 2020-06-05 DIAGNOSIS — J45909 Unspecified asthma, uncomplicated: Secondary | ICD-10-CM | POA: Diagnosis not present

## 2020-06-05 DIAGNOSIS — Z8673 Personal history of transient ischemic attack (TIA), and cerebral infarction without residual deficits: Secondary | ICD-10-CM | POA: Diagnosis not present

## 2020-06-05 DIAGNOSIS — J449 Chronic obstructive pulmonary disease, unspecified: Secondary | ICD-10-CM | POA: Diagnosis not present

## 2020-06-05 DIAGNOSIS — F17210 Nicotine dependence, cigarettes, uncomplicated: Secondary | ICD-10-CM | POA: Insufficient documentation

## 2020-06-05 DIAGNOSIS — N189 Chronic kidney disease, unspecified: Secondary | ICD-10-CM | POA: Insufficient documentation

## 2020-06-05 DIAGNOSIS — I130 Hypertensive heart and chronic kidney disease with heart failure and stage 1 through stage 4 chronic kidney disease, or unspecified chronic kidney disease: Secondary | ICD-10-CM | POA: Insufficient documentation

## 2020-06-05 DIAGNOSIS — Z954 Presence of other heart-valve replacement: Secondary | ICD-10-CM | POA: Diagnosis not present

## 2020-06-05 LAB — CBC WITH DIFFERENTIAL/PLATELET
Abs Immature Granulocytes: 0.03 10*3/uL (ref 0.00–0.07)
Basophils Absolute: 0 10*3/uL (ref 0.0–0.1)
Basophils Relative: 0 %
Eosinophils Absolute: 0.2 10*3/uL (ref 0.0–0.5)
Eosinophils Relative: 3 %
HCT: 40.1 % (ref 36.0–46.0)
Hemoglobin: 13.3 g/dL (ref 12.0–15.0)
Immature Granulocytes: 1 %
Lymphocytes Relative: 34 %
Lymphs Abs: 1.9 10*3/uL (ref 0.7–4.0)
MCH: 31.4 pg (ref 26.0–34.0)
MCHC: 33.2 g/dL (ref 30.0–36.0)
MCV: 94.6 fL (ref 80.0–100.0)
Monocytes Absolute: 0.6 10*3/uL (ref 0.1–1.0)
Monocytes Relative: 10 %
Neutro Abs: 2.9 10*3/uL (ref 1.7–7.7)
Neutrophils Relative %: 52 %
Platelets: 350 10*3/uL (ref 150–400)
RBC: 4.24 MIL/uL (ref 3.87–5.11)
RDW: 15.1 % (ref 11.5–15.5)
WBC: 5.5 10*3/uL (ref 4.0–10.5)
nRBC: 0 % (ref 0.0–0.2)

## 2020-06-05 LAB — TROPONIN I (HIGH SENSITIVITY): Troponin I (High Sensitivity): 5 ng/L (ref <18)

## 2020-06-05 LAB — COMPREHENSIVE METABOLIC PANEL
ALT: 13 U/L (ref 0–44)
AST: 23 U/L (ref 15–41)
Albumin: 3.8 g/dL (ref 3.5–5.0)
Alkaline Phosphatase: 67 U/L (ref 38–126)
Anion gap: 7 (ref 5–15)
BUN: 10 mg/dL (ref 6–20)
CO2: 27 mmol/L (ref 22–32)
Calcium: 8.8 mg/dL — ABNORMAL LOW (ref 8.9–10.3)
Chloride: 104 mmol/L (ref 98–111)
Creatinine, Ser: 1.25 mg/dL — ABNORMAL HIGH (ref 0.44–1.00)
GFR, Estimated: 49 mL/min — ABNORMAL LOW (ref >60)
Glucose, Bld: 117 mg/dL — ABNORMAL HIGH (ref 70–99)
Potassium: 4 mmol/L (ref 3.5–5.1)
Sodium: 138 mmol/L (ref 135–145)
Total Bilirubin: 0.5 mg/dL (ref 0.3–1.2)
Total Protein: 7.2 g/dL (ref 6.5–8.1)

## 2020-06-05 LAB — LACTIC ACID, PLASMA: Lactic Acid, Venous: 1.1 mmol/L (ref 0.5–1.9)

## 2020-06-05 LAB — LIPASE, BLOOD: Lipase: 34 U/L (ref 11–51)

## 2020-06-05 NOTE — ED Provider Notes (Signed)
Emergency Department Provider Note   I have reviewed the triage vital signs and the nursing notes.   HISTORY  Chief Complaint Fall   HPI Joanna Martinez is a 60 y.o. female presents to the ED with leg swelling over the last several days. She has been seen recently with frequent falls but denies any falls since last ED evaluation. She noted leg swelling and tightness bilaterally without rash which prompted her ED evaluation. No fever or chills. No lightheadedness. No CP or SOB. No abdominal or back pain.    Past Medical History:  Diagnosis Date  . Asthma   . Cardiomyopathy (HCC)   . CHF (congestive heart failure) (HCC)   . Chronic kidney disease   . COPD (chronic obstructive pulmonary disease) (HCC)   . Coronary artery disease   . H/O heart valve replacement with mechanical valve   . Heart attack (HCC)   . Hyperlipidemia   . Hypertension   . Paroxysmal atrial fibrillation (HCC)   . Stroke [facility])     Patient Active Problem List   Diagnosis Date Noted  . Mitral valve disorder 12/22/2019  . Hyperlipidemia 12/22/2019  . Monitoring for Aryon Nham-term anticoagulant use 08/28/2019  . Madden Piazza term (current) use of anticoagulants 07/10/2019  . S/P mitral valve replacement 07/10/2019  . Upper GI bleed 02/10/2019  . Major depression with psychotic features (HCC) 05/14/2018  . Paroxysmal atrial fibrillation (HCC) 10/22/2017  . Cardiac LV ejection fraction >40% 10/22/2017  . History of chronic pain 10/16/2017  . TIA (transient ischemic attack) 08/23/2017  . GAD (generalized anxiety disorder) 05/03/2017  . Essential hypertension 05/03/2017  . Unspecified sequelae of cerebral infarction 07/01/2015  . Pain in right hand 07/01/2015  . H/O mitral valve replacement with mechanical valve 09/11/2012    Past Surgical History:  Procedure Laterality Date  . CARDIAC SURGERY    . CORONARY ANGIOPLASTY WITH STENT PLACEMENT    . MITRAL VALVE SURGERY      Allergies Patient has no known  allergies.  Family History  Problem Relation Age of Onset  . HIV/AIDS Mother   . HIV/AIDS Father   . Hyperlipidemia Sister   . Hypertension Sister   . Diabetes Sister   . Hyperlipidemia Brother   . Hypertension Brother   . Diabetes Brother   . Hyperlipidemia Brother   . Hypertension Brother   . Diabetes Brother     Social History Social History   Tobacco Use  . Smoking status: Current Every Day Smoker    Packs/day: 1.00    Types: Cigarettes  . Smokeless tobacco: Never Used  Vaping Use  . Vaping Use: Never used  Substance Use Topics  . Alcohol use: Not Currently    Comment: occ  . Drug use: Never    Review of Systems  Constitutional: No fever/chills Eyes: No visual changes. ENT: No sore throat. Cardiovascular: Denies chest pain. Positive bilateral leg swelling.  Respiratory: Denies shortness of breath. Gastrointestinal: No abdominal pain.  No nausea, no vomiting.  No diarrhea.  No constipation. Genitourinary: Negative for dysuria. Musculoskeletal: Negative for back pain. Skin: Negative for rash. Neurological: Negative for headaches, focal weakness or numbness.  10-point ROS otherwise negative.  ____________________________________________   PHYSICAL EXAM:  VITAL SIGNS: ED Triage Vitals  Enc Vitals Group     BP 06/05/20 0939 (S) (!) 76/51     Pulse Rate 06/05/20 0939 82     Resp 06/05/20 0939 18     Temp 06/05/20 0939 98.3 F (36.8 C)  Temp Source 06/05/20 0939 Oral     SpO2 06/05/20 0939 93 %     Weight 06/05/20 0943 210 lb (95.3 kg)     Height 06/05/20 0943 5' (1.524 m)   Constitutional: Alert and oriented. Well appearing and in no acute distress. Eyes: Conjunctivae are normal.  Head: Atraumatic. Nose: No congestion/rhinnorhea. Mouth/Throat: Mucous membranes are moist.  Neck: No stridor.   Cardiovascular: Normal rate, regular rhythm. Good peripheral circulation. Grossly normal heart sounds.   Respiratory: Normal respiratory effort.  No  retractions. Lungs CTAB. Gastrointestinal: Soft and nontender. No distention.  Musculoskeletal: No lower extremity tenderness with bilateral 1+ pitting edema. No gross deformities of extremities. Neurologic:  Normal speech and language. No gross focal neurologic deficits are appreciated.  Skin:  Skin is warm, dry and intact. No rash noted.   ____________________________________________   LABS (all labs ordered are listed, but only abnormal results are displayed)  Labs Reviewed  COMPREHENSIVE METABOLIC PANEL - Abnormal; Notable for the following components:      Result Value   Glucose, Bld 117 (*)    Creatinine, Ser 1.25 (*)    Calcium 8.8 (*)    GFR, Estimated 49 (*)    All other components within normal limits  LIPASE, BLOOD  CBC WITH DIFFERENTIAL/PLATELET  LACTIC ACID, PLASMA  TROPONIN I (HIGH SENSITIVITY)   ____________________________________________  EKG   EKG Interpretation  Date/Time:  Sunday June 05 2020 10:33:10 EDT Ventricular Rate:  76 PR Interval:  148 QRS Duration: 106 QT Interval:  433 QTC Calculation: 487 R Axis:   -26 Text Interpretation: Sinus rhythm Borderline left axis deviation Borderline T wave abnormalities Borderline prolonged QT interval Confirmed by Alona Bene [phone]) on 06/05/2020 1:41:49 PM       ____________________________________________  RADIOLOGY  CXR reviewed.  ____________________________________________   PROCEDURES  Procedure(s) performed:   Procedures  None ____________________________________________   INITIAL IMPRESSION / ASSESSMENT AND PLAN / ED COURSE  Pertinent labs & imaging results that were available during my care of the patient were reviewed by me and considered in my medical decision making (see chart for details).   Patient arrives to the ED with leg swelling. No other near syncope, SOB, or CP symptoms. Initial BP in triage recorded in the 70s systolic. Patient is without symptoms and brought back to  the acute care area immediately with BP repeat and WNL. Her BP stayed WNL during her ED stay including orthostatics. No hypoxemia. Labs reviewed along with CXR with no acute findings.   Patient is feeling well overall. Plan for compression stockings and elevation of legs at home to reduce swelling. Plan for close PCP follow up in the coming week. Discussed ED return precautions.  ____________________________________________  FINAL CLINICAL IMPRESSION(S) / ED DIAGNOSES  Final diagnoses:  Leg swelling    Note:  This document was prepared using Dragon voice recognition software and may include unintentional dictation errors.  Alona Bene, MD, [facility]    Tellis Spivak, Arlyss Repress, MD 06/07/20 [phone]

## 2020-06-05 NOTE — ED Notes (Signed)
O2 dc'd; woke pt up for orthostatics; 96-96% on RA.

## 2020-06-05 NOTE — ED Notes (Signed)
Pt currently asleep will attempt to get orthostatic vital, as soon as possible

## 2020-06-05 NOTE — ED Triage Notes (Signed)
Pt reports fall 1 week ago today. Reports fall again 4 days ago. Pt denies hitting head. Pt reports bilateral leg swelling after the incident.

## 2020-06-05 NOTE — ED Notes (Signed)
Pt lying flat for orthostatics 

## 2020-06-05 NOTE — ED Notes (Signed)
Pt discharged to home. Discharge instructions have been discussed with patient and/or family members. Pt verbally acknowledges understanding d/c instructions, and endorses comprehension to checkout at registration before leaving.  °

## 2020-06-05 NOTE — Discharge Instructions (Signed)
You were seen in the emergency department today with swelling in the legs.  Your lab work and chest x-ray look good.  Please go to the pharmacy and ask to be shown the compression stockings.  You can wear these to prevent swelling in the legs and keep them elevated while at home and resting.  Please call your primary care doctor tomorrow to schedule a follow-up appointment.  Return to the emergency department any new or suddenly worsening symptoms.

## 2020-06-06 ENCOUNTER — Other Ambulatory Visit: Payer: Self-pay | Admitting: Pharmacist

## 2020-06-06 ENCOUNTER — Other Ambulatory Visit: Payer: Self-pay | Admitting: Cardiology

## 2020-06-06 DIAGNOSIS — I480 Paroxysmal atrial fibrillation: Secondary | ICD-10-CM

## 2020-06-06 DIAGNOSIS — Z5181 Encounter for therapeutic drug level monitoring: Secondary | ICD-10-CM

## 2020-06-06 DIAGNOSIS — Z7901 Long term (current) use of anticoagulants: Secondary | ICD-10-CM

## 2020-06-06 MED ORDER — WARFARIN SODIUM 3 MG PO TABS: 3.0000 mg | ORAL_TABLET | Freq: Every day | ORAL | 2 refills | Status: DC

## 2020-06-08 ENCOUNTER — Other Ambulatory Visit: Payer: Self-pay | Admitting: Cardiology

## 2020-06-08 DIAGNOSIS — I5022 Chronic systolic (congestive) heart failure: Secondary | ICD-10-CM

## 2020-06-09 ENCOUNTER — Other Ambulatory Visit: Payer: Self-pay

## 2020-06-15 ENCOUNTER — Other Ambulatory Visit: Payer: Self-pay | Admitting: Cardiology

## 2020-06-15 ENCOUNTER — Telehealth: Payer: Self-pay

## 2020-06-15 DIAGNOSIS — I5022 Chronic systolic (congestive) heart failure: Secondary | ICD-10-CM

## 2020-06-15 DIAGNOSIS — I2510 Atherosclerotic heart disease of native coronary artery without angina pectoris: Secondary | ICD-10-CM

## 2020-06-16 ENCOUNTER — Other Ambulatory Visit: Payer: Self-pay

## 2020-06-16 ENCOUNTER — Ambulatory Visit: Payer: Medicaid Other | Admitting: Pharmacist

## 2020-06-16 DIAGNOSIS — Z5181 Encounter for therapeutic drug level monitoring: Secondary | ICD-10-CM

## 2020-06-16 DIAGNOSIS — Z952 Presence of prosthetic heart valve: Secondary | ICD-10-CM

## 2020-06-16 DIAGNOSIS — I480 Paroxysmal atrial fibrillation: Secondary | ICD-10-CM

## 2020-06-16 LAB — POCT INR: INR: 1.8 — AB (ref 2.0–3.0)

## 2020-06-16 NOTE — Patient Instructions (Signed)
INR below goal. Take 6 mg today and tomorrow and then increase weekly dose to 4.5 mg all other days. Recheck INR in 2 weeks.

## 2020-06-16 NOTE — Progress Notes (Signed)
Anticoagulation Management Joanna Martinez is a 60 y.o. female who reports to the clinic for monitoring of warfarin treatment.    Indication: atrial fibrillation and s/p Saint Jude mechanical mitral valve replacement in March 2002 ; CHA2DS2 Vasc Score 6 (Female, CHF hx, HTN hx, Stroke/TIA hx, valvular heart disease), HAS-BLED 1 (Stroke hx)    Duration: indefinite Supervising physician: Tessa Lerner  Anticoagulation Clinic Visit History:  Hx of multiple previous strokes. Most recent incidence 3 or 4 years ago. Pt also on Plavix for history of coronary artery disease status post drug-eluting stent in August 2019. Noteded major DDI b/w warfarin and plavix of increased bleeding risk. Noted DDI b/w warfarin and allopurinol about increased anticoagulant effect as well. Pt denies any recent allopurinol dose changes.Pt confirmed to have a tan colored (3 mg) warfarin tablet at home. Pt started taking warfarin in the evening as previously discussed.     Patient does not report signs/symptoms of bleeding or thromboembolism.   Other recent changes: No changes in medications or lifestyle. Recent start farxiga and lasix .   Anticoagulation Episode Summary    Current INR goal:  2.5-3.5  TTR:  54.5 % (11.1 mo)  Next INR check:  07/05/2020  INR from last check:  1.8 (06/16/2020)  Weekly max warfarin dose:    Target end date:  Indefinite  INR check location:    Preferred lab:    Send INR reminders to:     Indications   S/P mitral valve replacement [Z95.2] Paroxysmal atrial fibrillation (HCC) [I48.0] Monitoring for long-term anticoagulant use [Z51.81 Z79.01]       Comments:          No Known Allergies  Current Outpatient Medications:  .  allopurinol (ZYLOPRIM) 100 MG tablet, Take 1 tablet by mouth daily. , Disp: , Rfl:  .  ALPRAZolam (XANAX) 0.5 MG tablet, Take 0.5 mg by mouth 2 (two) times daily as needed., Disp: , Rfl:  .  atorvastatin (LIPITOR) 80 MG tablet, Take 1 tablet (80 mg total) by  mouth at bedtime., Disp: 90 tablet, Rfl: 1 .  buPROPion (WELLBUTRIN SR) 150 MG 12 hr tablet, Take 1 tablet by mouth 2 (two) times daily. , Disp: , Rfl:  .  clopidogrel (PLAVIX) 75 MG tablet, Take 1 tablet (75 mg total) by mouth daily., Disp: 90 tablet, Rfl: 2 .  dapagliflozin propanediol (FARXIGA) 10 MG TABS tablet, Take 1 tablet (10 mg total) by mouth daily before breakfast., Disp: 90 tablet, Rfl: 0 .  Diclofenac Sodium 3 % GEL, Apply topically., Disp: , Rfl:  .  DULoxetine (CYMBALTA) 30 MG capsule, Take 30 mg by mouth daily., Disp: , Rfl:  .  ENTRESTO 49-51 MG, Take 1 tablet by mouth twice daily, Disp: 60 tablet, Rfl: 0 .  ferrous sulfate 325 (65 FE) MG EC tablet, Take 1 tablet by mouth daily., Disp: , Rfl:  .  furosemide (LASIX) 20 MG tablet, Take 0.5 tablets (10 mg total) by mouth every morning. (Patient taking differently: Take 20 mg by mouth every morning.), Disp: 45 tablet, Rfl: 0 .  gabapentin (NEURONTIN) 600 MG tablet, Take 1 capsule by mouth 3 (three) times daily. , Disp: , Rfl:  .  metoprolol succinate (TOPROL-XL) 25 MG 24 hr tablet, TAKE 1 TABLET BY MOUTH ONCE DAILY HOLD  WHEN  SYSTOLIC  IS  LESS  THAN  100  OR  HEARTRATE  LESS  THAN  60  BPM, Disp: 30 tablet, Rfl: 0 .  naloxone (NARCAN) nasal spray 4  mg/0.1 mL, SMARTSIG:1 Spray(s) Both Nares 1 to 2 Times Daily, Disp: , Rfl:  .  omeprazole (PRILOSEC) 40 MG capsule, Take 1 capsule by mouth daily., Disp: , Rfl:  .  oxyCODONE-acetaminophen (PERCOCET/ROXICET) 5-325 MG tablet, Take 1 tablet by mouth 3 (three) times daily as needed., Disp: , Rfl:  .  PROAIR HFA 108 (90 Base) MCG/ACT inhaler, SMARTSIG:1 Puff(s) By Mouth Every 4-6 Hours PRN, Disp: , Rfl:  .  QUEtiapine (SEROQUEL) 400 MG tablet, Take 400 mg by mouth at bedtime., Disp: , Rfl:  .  SUTAB [phone] MG TABS, TAKE 24 TABLETS BY MOUTH PER PREP TEST, Disp: , Rfl:  .  tiZANidine (ZANAFLEX) 4 MG tablet, Take 4 mg by mouth 3 (three) times daily as needed., Disp: , Rfl:  .  traZODone  (DESYREL) 150 MG tablet, Take 150 mg by mouth at bedtime. , Disp: , Rfl:  .  Vitamin D, Ergocalciferol, (DRISDOL) 1.25 MG (50000 UNIT) CAPS capsule, Take 50,000 Units by mouth once a week., Disp: , Rfl:  .  warfarin (COUMADIN) 3 MG tablet, Take 1 tablet (3 mg total) by mouth daily at 4 PM. Take as directed by coumadin clinic, Disp: 100 tablet, Rfl: 2 .  zolpidem (AMBIEN) 5 MG tablet, Take 5 mg by mouth at bedtime as needed., Disp: , Rfl:  Past Medical History:  Diagnosis Date  . Asthma   . Cardiomyopathy (HCC)   . CHF (congestive heart failure) (HCC)   . Chronic kidney disease   . COPD (chronic obstructive pulmonary disease) (HCC)   . Coronary artery disease   . H/O heart valve replacement with mechanical valve   . Heart attack (HCC)   . Hyperlipidemia   . Hypertension   . Paroxysmal atrial fibrillation (HCC)   . Stroke [facility])     ASSESSMENT  Recent Results: The most recent result is correlated with 30 mg per week:  Lab Results  Component Value Date   INR 1.8 (A) 06/16/2020   INR 2.6 06/03/2020   INR 2.1 05/19/2020    Anticoagulation Dosing: Description   INR below goal. Take 6 mg today and tomorrow and then increase weekly dose to 4.5 mg all other days. Recheck INR in 2 weeks.      INR today: Subtherapeutic. Pt denies any missed or skipped doses. Pt denies any other relevant changes in her diet, medications, or lifestyle. Denies any unusual bleeding and bruising symptoms. Recent improvement in swelling may be associated with anticoagulation effect. Will continue close monitoring and follow up.   PLAN Weekly dose was increased by 5.3 % to 31.5 mg/week. Take 6 mg today and tomorrow. Then increase weekly dose to 4.5 mg every day. Recheck INR in 2 weeks.    Patient Instructions  INR below goal. Take 6 mg today and tomorrow and then increase weekly dose to 4.5 mg all other days. Recheck INR in 2 weeks.   Patient advised to contact clinic or seek medical attention if  signs/symptoms of bleeding or thromboembolism occur.  Patient verbalized understanding by repeating back information and was advised to contact me if further medication-related questions arise.   Follow-up Return in about 19 days (around 07/05/2020).  Leonides Schanz, PharmD  15 minutes spent face-to-face with the patient during the encounter. 50% of time spent on education, including signs/sx bleeding and clotting, as well as food and drug interactions with warfarin. 50% of time was spent on fingerprick POC INR sample collection,processing, results determination, and documentation

## 2020-06-20 ENCOUNTER — Other Ambulatory Visit: Payer: Self-pay | Admitting: Cardiology

## 2020-06-20 DIAGNOSIS — I5022 Chronic systolic (congestive) heart failure: Secondary | ICD-10-CM

## 2020-06-20 DIAGNOSIS — I2510 Atherosclerotic heart disease of native coronary artery without angina pectoris: Secondary | ICD-10-CM

## 2020-06-20 DIAGNOSIS — I5023 Acute on chronic systolic (congestive) heart failure: Secondary | ICD-10-CM

## 2020-06-20 NOTE — Progress Notes (Signed)
Attempted to call pt, no answer. Left vm requesting call back.

## 2020-06-20 NOTE — Progress Notes (Signed)
Pt called back and was informed about her results. Pt understood

## 2020-06-24 ENCOUNTER — Other Ambulatory Visit: Payer: Self-pay | Admitting: Pharmacist

## 2020-06-24 DIAGNOSIS — I5023 Acute on chronic systolic (congestive) heart failure: Secondary | ICD-10-CM

## 2020-06-25 MED ORDER — DAPAGLIFLOZIN PROPANEDIOL 10 MG PO TABS: 10.0000 mg | ORAL_TABLET | Freq: Every day | ORAL | 3 refills | Status: DC

## 2020-07-05 ENCOUNTER — Other Ambulatory Visit: Payer: Self-pay

## 2020-07-05 ENCOUNTER — Ambulatory Visit: Payer: Medicaid Other

## 2020-07-05 ENCOUNTER — Ambulatory Visit: Payer: Medicaid Other | Admitting: Pharmacist

## 2020-07-05 DIAGNOSIS — I5022 Chronic systolic (congestive) heart failure: Secondary | ICD-10-CM

## 2020-07-05 DIAGNOSIS — Z952 Presence of prosthetic heart valve: Secondary | ICD-10-CM

## 2020-07-05 DIAGNOSIS — Z5181 Encounter for therapeutic drug level monitoring: Secondary | ICD-10-CM

## 2020-07-05 DIAGNOSIS — I255 Ischemic cardiomyopathy: Secondary | ICD-10-CM

## 2020-07-05 DIAGNOSIS — I480 Paroxysmal atrial fibrillation: Secondary | ICD-10-CM

## 2020-07-05 LAB — POCT INR: INR: 3.2 — AB (ref 2.0–3.0)

## 2020-07-05 NOTE — Progress Notes (Signed)
Anticoagulation Management Joanna Martinez is a 60 y.o. female who reports to the clinic for monitoring of warfarin treatment.    Indication: atrial fibrillation and s/p Saint Jude mechanical mitral valve replacement in March 2002 ; CHA2DS2 Vasc Score 6 (Female, CHF hx, HTN hx, Stroke/TIA hx, valvular heart disease), HAS-BLED 1 (Stroke hx)   Duration: indefinite Supervising physician: Tessa Lerner  Anticoagulation Clinic Visit History:  Hx of multiple previous strokes. Most recent incidence 3 or 4 years ago. Pt also on Plavix for history of coronary artery disease status post drug-eluting stent in August 2019. Noteded major DDI b/w warfarin and plavix of increased bleeding risk. Noted DDI b/w warfarin and allopurinol about increased anticoagulant effect as well. Pt denies any recent allopurinol dose changes.Pt confirmed to have a tan colored (3 mg) warfarin tablet at home. Pt started taking warfarin in the evening as previously discussed.     Patient does not report signs/symptoms of bleeding or thromboembolism.   Other recent changes: No changes in medications or lifestyle. Recent start farxiga and lasix . Recent increased intake of salads daily. Pt reports that her lower extremity swelling has improved since lasix start.   Anticoagulation Episode Summary    Current INR goal:  2.5-3.5  TTR:  54.3 % (11.7 mo)  Next INR check:  07/14/2020  INR from last check:  3.2 (07/05/2020)  Weekly max warfarin dose:    Target end date:  Indefinite  INR check location:    Preferred lab:    Send INR reminders to:     Indications   S/P mitral valve replacement [Z95.2] Paroxysmal atrial fibrillation (HCC) [I48.0] Monitoring for long-term anticoagulant use [Z51.81 Z79.01]       Comments:          No Known Allergies  Current Outpatient Medications:  .  allopurinol (ZYLOPRIM) 100 MG tablet, Take 1 tablet by mouth daily. , Disp: , Rfl:  .  ALPRAZolam (XANAX) 0.5 MG tablet, Take 0.5 mg by mouth 2  (two) times daily as needed., Disp: , Rfl:  .  atorvastatin (LIPITOR) 80 MG tablet, Take 1 tablet (80 mg total) by mouth at bedtime., Disp: 90 tablet, Rfl: 1 .  buPROPion (WELLBUTRIN SR) 150 MG 12 hr tablet, Take 1 tablet by mouth 2 (two) times daily. , Disp: , Rfl:  .  clopidogrel (PLAVIX) 75 MG tablet, Take 1 tablet (75 mg total) by mouth daily., Disp: 90 tablet, Rfl: 2 .  dapagliflozin propanediol (FARXIGA) 10 MG TABS tablet, Take 1 tablet (10 mg total) by mouth daily before breakfast., Disp: 90 tablet, Rfl: 3 .  Diclofenac Sodium 3 % GEL, Apply topically., Disp: , Rfl:  .  DULoxetine (CYMBALTA) 30 MG capsule, Take 30 mg by mouth daily., Disp: , Rfl:  .  ENTRESTO 49-51 MG, Take 1 tablet by mouth twice daily, Disp: 60 tablet, Rfl: 0 .  ferrous sulfate 325 (65 FE) MG EC tablet, Take 1 tablet by mouth daily., Disp: , Rfl:  .  furosemide (LASIX) 20 MG tablet, Take 0.5 tablets (10 mg total) by mouth every morning. (Patient taking differently: Take 20 mg by mouth every morning.), Disp: 45 tablet, Rfl: 0 .  gabapentin (NEURONTIN) 600 MG tablet, Take 1 capsule by mouth 3 (three) times daily. , Disp: , Rfl:  .  metoprolol succinate (TOPROL-XL) 25 MG 24 hr tablet, TAKE 1 TABLET BY MOUTH ONCE DAILY HOLD  WHEN  SYSTOLIC  IS  LESS  THAN  100  OR  HEARTRATE  LESS  THAN  60  BPM, Disp: 30 tablet, Rfl: 0 .  naloxone (NARCAN) nasal spray 4 mg/0.1 mL, SMARTSIG:1 Spray(s) Both Nares 1 to 2 Times Daily, Disp: , Rfl:  .  omeprazole (PRILOSEC) 40 MG capsule, Take 1 capsule by mouth daily., Disp: , Rfl:  .  oxyCODONE-acetaminophen (PERCOCET) 7.5-325 MG tablet, Take 1 tablet by mouth 4 (four) times daily as needed., Disp: , Rfl:  .  PROAIR HFA 108 (90 Base) MCG/ACT inhaler, SMARTSIG:1 Puff(s) By Mouth Every 4-6 Hours PRN, Disp: , Rfl:  .  QUEtiapine (SEROQUEL) 400 MG tablet, Take 400 mg by mouth at bedtime., Disp: , Rfl:  .  SUTAB [phone] MG TABS, TAKE 24 TABLETS BY MOUTH PER PREP TEST, Disp: , Rfl:  .  tiZANidine  (ZANAFLEX) 4 MG tablet, Take 4 mg by mouth 3 (three) times daily as needed., Disp: , Rfl:  .  traZODone (DESYREL) 150 MG tablet, Take 150 mg by mouth at bedtime. , Disp: , Rfl:  .  Vitamin D, Ergocalciferol, (DRISDOL) 1.25 MG (50000 UNIT) CAPS capsule, Take 50,000 Units by mouth once a week., Disp: , Rfl:  .  warfarin (COUMADIN) 3 MG tablet, Take 1 tablet (3 mg total) by mouth daily at 4 PM. Take as directed by coumadin clinic, Disp: 100 tablet, Rfl: 2 .  zolpidem (AMBIEN) 5 MG tablet, Take 5 mg by mouth at bedtime as needed., Disp: , Rfl:  Past Medical History:  Diagnosis Date  . Asthma   . Cardiomyopathy (HCC)   . CHF (congestive heart failure) (HCC)   . Chronic kidney disease   . COPD (chronic obstructive pulmonary disease) (HCC)   . Coronary artery disease   . H/O heart valve replacement with mechanical valve   . Heart attack (HCC)   . Hyperlipidemia   . Hypertension   . Paroxysmal atrial fibrillation (HCC)   . Stroke [facility])     ASSESSMENT  Recent Results: The most recent result is correlated with 34.5 mg per week:  Lab Results  Component Value Date   INR 3.2 (A) 07/05/2020   INR 1.8 (A) 06/16/2020   INR 2.6 06/03/2020    Anticoagulation Dosing: Description   INR at goal. Continue taking 6 mg every Mon and Fri and 4.5 mg all other days. Recheck INR in 1 week.      INR today: Therapeutic. Following recent increased weekly dose. Increased intake of salad daily, and pt is planing on staying consistent with with it moving forward. Pt denies any missed or skipped doses. Pt denies any other relevant changes in her diet, medications, or lifestyle. Denies any unusual bleeding and bruising symptoms. Will continue current weekly dose and close monitoring.   PLAN Weekly dose was unchanged by 0% to 34.5 mg/week. Take 6 mg every Mon and Fri and 4.5 mg all other days. Recheck INR in 1 week.    Patient Instructions  INR at goal. Continue taking 6 mg every Mon and Fri and 4.5 mg all  other days. Recheck INR in 1 week.   Patient advised to contact clinic or seek medical attention if signs/symptoms of bleeding or thromboembolism occur.  Patient verbalized understanding by repeating back information and was advised to contact me if further medication-related questions arise.   Follow-up Return in about 9 days (around 07/14/2020).  Leonides Schanz, PharmD  15 minutes spent face-to-face with the patient during the encounter. 50% of time spent on education, including signs/sx bleeding and clotting, as well as food and drug interactions  with warfarin. 50% of time was spent on fingerprick POC INR sample collection,processing, results determination, and documentation

## 2020-07-05 NOTE — Patient Instructions (Signed)
INR at goal. Continue taking 6 mg every Mon and Fri and 4.5 mg all other days. Recheck INR in 1 week.

## 2020-07-08 ENCOUNTER — Other Ambulatory Visit: Payer: Self-pay | Admitting: Pharmacist

## 2020-07-08 DIAGNOSIS — Z792 Long term (current) use of antibiotics: Secondary | ICD-10-CM

## 2020-07-11 MED ORDER — AMOXICILLIN 500 MG PO TABS: 2000.0000 mg | ORAL_TABLET | Freq: Once | ORAL | 1 refills | Status: AC

## 2020-07-13 LAB — BASIC METABOLIC PANEL
BUN/Creatinine Ratio: 13 (ref 12–28)
BUN: 15 mg/dL (ref 8–27)
CO2: 23 mmol/L (ref 20–29)
Calcium: 9.4 mg/dL (ref 8.7–10.3)
Chloride: 102 mmol/L (ref 96–106)
Creatinine, Ser: 1.13 mg/dL — ABNORMAL HIGH (ref 0.57–1.00)
Glucose: 151 mg/dL — ABNORMAL HIGH (ref 65–99)
Potassium: 5.2 mmol/L (ref 3.5–5.2)
Sodium: 139 mmol/L (ref 134–144)
eGFR: 56 mL/min/{1.73_m2} — ABNORMAL LOW (ref >59)

## 2020-07-13 LAB — PRO B NATRIURETIC PEPTIDE: NT-Pro BNP: 120 pg/mL (ref 0–287)

## 2020-07-13 LAB — MAGNESIUM: Magnesium: 2.2 mg/dL (ref 1.6–2.3)

## 2020-07-13 NOTE — Progress Notes (Signed)
Spoke to patient she is aware

## 2020-07-14 ENCOUNTER — Other Ambulatory Visit: Payer: Self-pay

## 2020-07-14 ENCOUNTER — Ambulatory Visit: Payer: Medicaid Other | Admitting: Cardiology

## 2020-07-14 ENCOUNTER — Encounter: Payer: Self-pay | Admitting: Cardiology

## 2020-07-14 VITALS — BP 120/76 | HR 74 | Resp 16 | Ht 60.0 in | Wt 200.0 lb

## 2020-07-14 DIAGNOSIS — I2510 Atherosclerotic heart disease of native coronary artery without angina pectoris: Secondary | ICD-10-CM

## 2020-07-14 DIAGNOSIS — Z955 Presence of coronary angioplasty implant and graft: Secondary | ICD-10-CM

## 2020-07-14 DIAGNOSIS — Z720 Tobacco use: Secondary | ICD-10-CM

## 2020-07-14 DIAGNOSIS — Z8673 Personal history of transient ischemic attack (TIA), and cerebral infarction without residual deficits: Secondary | ICD-10-CM

## 2020-07-14 DIAGNOSIS — E782 Mixed hyperlipidemia: Secondary | ICD-10-CM

## 2020-07-14 DIAGNOSIS — I480 Paroxysmal atrial fibrillation: Secondary | ICD-10-CM

## 2020-07-14 DIAGNOSIS — Z7901 Long term (current) use of anticoagulants: Secondary | ICD-10-CM

## 2020-07-14 DIAGNOSIS — I5022 Chronic systolic (congestive) heart failure: Secondary | ICD-10-CM

## 2020-07-14 DIAGNOSIS — Z6839 Body mass index (BMI) 39.0-39.9, adult: Secondary | ICD-10-CM

## 2020-07-14 DIAGNOSIS — I10 Essential (primary) hypertension: Secondary | ICD-10-CM

## 2020-07-14 DIAGNOSIS — I255 Ischemic cardiomyopathy: Secondary | ICD-10-CM

## 2020-07-14 DIAGNOSIS — Z952 Presence of prosthetic heart valve: Secondary | ICD-10-CM

## 2020-07-14 DIAGNOSIS — Z5181 Encounter for therapeutic drug level monitoring: Secondary | ICD-10-CM

## 2020-07-14 DIAGNOSIS — Z8679 Personal history of other diseases of the circulatory system: Secondary | ICD-10-CM

## 2020-07-14 DIAGNOSIS — E66812 Obesity, class 2: Secondary | ICD-10-CM

## 2020-07-14 LAB — POCT INR: INR: 2.8 (ref 2.0–3.0)

## 2020-07-14 MED ORDER — ENTRESTO 49-51 MG PO TABS: 1.0000 | ORAL_TABLET | Freq: Two times a day (BID) | ORAL | 0 refills | Status: DC

## 2020-07-14 NOTE — Progress Notes (Signed)
Joanna Martinez Date of Birth: 1960/11/29 MRN: [id number] Primary Care Provider:[place], Joanna Martinez, [place] Former Cardiology Providers: Dr. Thayer Jew [phone] Primary Cardiologist: Rex Kras, DO, [facility] (established care 07/02/2019)  Date: 07/14/20 Last Office Visit: 06/03/2020  Chief Complaint  Patient presents with  . Shortness of Breath  . heart failure with reduced ejection fraction  . Follow-up    HPI  Joanna Martinez is a 60 y.o. female who presents to the office with a  chief complaint of "shortness of breath and heart failure management. "   Her past medical history and cardiovascular risk factors are: History of atherosclerotic coronary artery disease with prior PCI performed on October 21, 2017 with DES to LCx and [facility] Jude mechanical mitral valve replacement in March 2002 at [facility] for rheumatic mitral stenosis, on oral anticoagulation, hypertension, tobacco smoker, several small strokes, paroxysmal atrial fibrillation (outside records from [facility]), HFrEF, ischemic cardiomyopathy, postmenopausal female, advanced age, obesity.   Referred to the office to establish care given her history of coronary artery disease and status post mechanical mitral valve replacement at the request of her primary care physician. She has history of atherosclerotic coronary artery disease with prior PCI to LCX and SJM mechanical mitral valve replacement in 05/2000 at [facility] for rheumatic mitral stenosis on long term [place] with coumadin. Last heart cath was in November 2019 due to angina which demonstrated scattered moderate non-obstructive disease with 65% stenosis of the ostial RPDA and medical therapy was recommended.  Patient presents for 1 month follow-up after being treated for acute exacerbation of heart failure with reduced EF.  At the last office visit we uptitrated her medical therapy and initiated Farxiga.  She is tolerated the medication changes well without any side  effects or intolerances.  She has lost 12 pounds since last office visit.  Her lab work independently reviewed and noted below for further reference.  Patient denies any chest pain or anginal equivalent.  She is less short of breath, euvolemic, and not in congestive heart failure.  No hospitalizations or urgent care visits for cardiovascular symptoms since last encounter.  Unfortunately she continues to smoke on a daily basis.  She recently lost her brother who was chronically ill.  FUNCTIONAL STATUS: No exercise or daily routine.    ALLERGIES: No Known Allergies  MEDICATION LIST PRIOR TO VISIT: Current Meds  Medication Sig  . allopurinol (ZYLOPRIM) 100 MG tablet Take 1 tablet by mouth daily.   Marland Kitchen ALPRAZolam (XANAX) 0.5 MG tablet Take 0.5 mg by mouth 2 (two) times daily as needed.  Marland Kitchen atorvastatin (LIPITOR) 80 MG tablet Take 1 tablet (80 mg total) by mouth at bedtime.  . dapagliflozin propanediol (FARXIGA) 10 MG TABS tablet Take 1 tablet (10 mg total) by mouth daily before breakfast.  . Diclofenac Sodium 3 % GEL Apply topically.  . DULoxetine (CYMBALTA) 30 MG capsule Take 30 mg by mouth daily.  . ferrous sulfate 325 (65 FE) MG EC tablet Take 1 tablet by mouth daily.  . furosemide (LASIX) 20 MG tablet Take 0.5 tablets (10 mg total) by mouth every morning. (Patient taking differently: Take 20 mg by mouth every morning.)  . gabapentin (NEURONTIN) 600 MG tablet Take 1 capsule by mouth 3 (three) times daily.   . metoprolol succinate (TOPROL-XL) 25 MG 24 hr tablet TAKE 1 TABLET BY MOUTH ONCE DAILY HOLD  WHEN  SYSTOLIC  IS  LESS  THAN  100  OR  HEARTRATE  LESS  THAN  60  BPM  .  naloxone (NARCAN) nasal spray 4 mg/0.1 mL SMARTSIG:1 Spray(s) Both Nares 1 to 2 Times Daily  . omeprazole (PRILOSEC) 40 MG capsule Take 1 capsule by mouth daily.  Marland Kitchen oxyCODONE-acetaminophen (PERCOCET) 7.5-325 MG tablet Take 1 tablet by mouth 4 (four) times daily as needed.  Marland Kitchen PROAIR HFA 108 (90 Base) MCG/ACT inhaler SMARTSIG:1  Puff(s) By Mouth Every 4-6 Hours PRN  . QUEtiapine (SEROQUEL) 400 MG tablet Take 400 mg by mouth at bedtime.  Marland Kitchen SUTAB [phone] MG TABS TAKE 24 TABLETS BY MOUTH PER PREP TEST  . tiZANidine (ZANAFLEX) 4 MG tablet Take 4 mg by mouth 3 (three) times daily as needed.  . Vitamin D, Ergocalciferol, (DRISDOL) 1.25 MG (50000 UNIT) CAPS capsule Take 50,000 Units by mouth once a week.  . warfarin (COUMADIN) 3 MG tablet Take 1 tablet (3 mg total) by mouth daily at 4 PM. Take as directed by coumadin clinic  . [DISCONTINUED] ENTRESTO 49-51 MG Take 1 tablet by mouth twice daily     PAST MEDICAL HISTORY: Past Medical History:  Diagnosis Date  . Asthma   . Cardiomyopathy ([place])   . CHF (congestive heart failure) ([place])   . Chronic kidney disease   . COPD (chronic obstructive pulmonary disease) ([place])   . Coronary artery disease   . H/O heart valve replacement with mechanical valve   . Heart attack ([place])   . Hyperlipidemia   . Hypertension   . Paroxysmal atrial fibrillation (HCC)   . Stroke [facility])     PAST SURGICAL HISTORY: Past Surgical History:  Procedure Laterality Date  . CARDIAC SURGERY    . CORONARY ANGIOPLASTY WITH STENT PLACEMENT    . MITRAL VALVE SURGERY      FAMILY HISTORY: The patient family history includes Diabetes in her brother, brother, and sister; HIV/AIDS in her father and mother; Hyperlipidemia in her brother, brother, and sister; Hypertension in her brother, brother, and sister.  SOCIAL HISTORY:  The patient  reports that she has been smoking cigarettes. She has been smoking about 1.00 pack per day. She has never used smokeless tobacco. She reports previous alcohol use. She reports that she does not use drugs.  REVIEW OF SYSTEMS: Review of Systems  Constitutional: Negative for chills and fever.  HENT: Negative for hoarse voice and nosebleeds.   Eyes: Negative for discharge, double vision and pain.  Cardiovascular: Negative for chest pain, claudication, dyspnea on  exertion, leg swelling, near-syncope, orthopnea, palpitations, paroxysmal nocturnal dyspnea and syncope.  Respiratory: Positive for shortness of breath. Negative for hemoptysis.   Musculoskeletal: Negative for muscle cramps and myalgias.  Gastrointestinal: Negative for abdominal pain, constipation, diarrhea, hematemesis, hematochezia, melena, nausea and vomiting.  Neurological: Negative for dizziness, focal weakness, light-headedness, loss of balance and paresthesias.    PHYSICAL EXAM: Vitals with BMI 07/14/2020 06/05/2020 06/05/2020  Height 5' 0"  - -  Weight 200 lbs - -  BMI 09.81 - -  Systolic 191 478 295  Diastolic 76 80 82  Pulse 74 76 80   CONSTITUTIONAL: Well-developed and well-nourished. No acute distress.  SKIN: Skin is warm and dry. No rash noted. No cyanosis. No pallor. No jaundice HEAD: Normocephalic and atraumatic.  EYES: No scleral icterus MOUTH/THROAT: Moist oral membranes.  NECK: No JVD present. No thyromegaly noted. No carotid bruits   LYMPHATIC: No visible cervical adenopathy.  CHEST Normal respiratory effort. No intercostal retractions  LUNGS: Clear to auscultation bilaterally. No stridor. No wheezes. No rales.  CARDIOVASCULAR: Regular rate and rhythm, positive A2-Z3, mechanical click heard at  the apex, no murmurs rubs or gallops appreciated. ABDOMINAL: Obese, soft, nontender, nondistended, positive bowel sounds all 4 quadrants. No apparent ascites.  EXTREMITIES: No peripheral edema. RUE brace to straighten her fingers.  HEMATOLOGIC: No significant bruising NEUROLOGIC: Oriented to person, place, and time. Nonfocal. Normal muscle tone.  Cranial nerves 2-12 grossly intact.   PSYCHIATRIC: Normal mood and affect. Normal behavior. Cooperative   CARDIAC DATABASE: Status post SJM mechanical mitral valve placement in March 2002 at [facility] due to rheumatic mitral stenosis.  EKG: 07/02/2019: Sinus rhythm, 94 bpm, normal axis, incomplete right bundle branch block, left  atrial enlargement, nonspecific T wave abnormality.   06/03/2020: Normal sinus rhythm, 66 bpm, left axis deviation, without underlying injury pattern, occasional PVCs.  Echocardiogram: 07/05/2020: Moderately depressed LV systolic function with visual EF 35-40%. Left ventricle cavity is normal in size. Normal left ventricular wall thickness. Normal global wall motion. Abnormal septal wall motion due to post-operative septum.  Mechanical mitral valve. No evidence of mitral stenosis. (PG 5.23mHG MG 3.290mG, MVA 1.8). Trace mitral regurgitation. Trace tricuspid regurgitation. No evidence of pulmonary hypertension. Compared to study dated 07/10/2019: LVEF improved from 30-35% to 35-40% otherwise no significant change.   Stress Testing: We will obtain outside records.  Heart Catheterization: November 2019 per report: Proximal LAD 20%. LCx stent patent.  Discrete 20% narrowing noted distal to the stent.  A separate 30% stenosis was noted in the distal LCx. RCA: Diffuse 20-30% narrowing in the proximal/mid/distal segments. Posterior atrioventricular branch 50 to 60% stenosis in its distal segment prior to the origin of the most distal posterolateral branch. Right PDA smooth tubular lesions with 60 to 70% luminal narrowing.    Lower Extremity Venous Duplex Left leg 06/03/2020: No evidence of deep vein thrombosis of the left lower extremity with normal venous return.  LABORATORY DATA: CBC Latest Ref Rng & Units 06/05/2020  WBC 4.0 - 10.5 K/uL 5.5  Hemoglobin 12.0 - 15.0 g/dL 13.3  Hematocrit 36.0 - 46.0 % 40.1  Platelets 150 - 400 K/uL 350    CMP Latest Ref Rng & Units 07/12/2020 06/05/2020 02/05/2020  Glucose 65 - 99 mg/dL 151(H) 117(H) 109(H)  BUN 8 - 27 mg/dL 15 10 8   Creatinine 0.57 - 1.00 mg/dL 1.13(H) 1.25(H) 0.99  Sodium 134 - 144 mmol/L 139 138 140  Potassium 3.5 - 5.2 mmol/L 5.2 4.0 4.3  Chloride 96 - 106 mmol/L 102 104 106  CO2 20 - 29 mmol/L 23 27 19(L)  Calcium 8.7 - 10.3 mg/dL 9.4  8.8(L) 9.0  Total Protein 6.5 - 8.1 g/dL - 7.2 -  Total Bilirubin 0.3 - 1.2 mg/dL - 0.5 -  Alkaline Phos 38 - 126 U/L - 67 -  AST 15 - 41 U/L - 23 -  ALT 0 - 44 U/L - 13 -    Lipid Panel  No results found for: CHOL, TRIG, HDL, CHOLHDL, VLDL, LDLCALC, LDLDIRECT, LABVLDL  No results found for: HGBA1C No components found for: NTPROBNP No results found for: TSH  BMP Recent Labs    01/01/20 1130 01/19/20 1113 02/05/20 1053 06/05/20 1039 07/12/20 1042  NA 141 142 140 138 139  K 3.9 4.3 4.3 4.0 5.2  CL 105 104 106 104 102  CO2 21 23 19* 27 23  GLUCOSE 137* 106* 109* 117* 151*  BUN 7* 5* 8 10 15   CREATININE 0.83 1.07* 0.99 1.25* 1.13*  CALCIUM 9.1 9.8 9.0 8.8* 9.4  GFRNONAA 77 57* 62 49*  --   GFRAA 89  65 72  --   --     CBC No results for input(s): WBC, RBC, HGB, HCT, PLT, MCV, MCH, MCHC, RDW, LYMPHSABS, MONOABS, EOSABS, BASOSABS in the last 168 hours.  Invalid input(s): NEUTRABS  HEMOGLOBIN A1C No results found for: HGBA1C, MPG  Cardiac Panel (last 3 results) No results for input(s): CKTOTAL, CKMB, TROPONINI, RELINDX in the last 8760 hours. No results for input(s): TROPIPOC in the last 8760 hours.  BNP (last 3 results) Recent Labs    01/19/20 1113 02/05/20 1053 07/12/20 1041  PROBNP 360* 200 120    TSH No results for input(s): TSH in the last 8760 hours.  CHOLESTEROL No results for input(s): CHOL in the last 8760 hours.  Hepatic Function Panel Recent Labs    06/05/20 1039  PROT 7.2  ALBUMIN 3.8  AST 23  ALT 13  ALKPHOS 67  BILITOT 0.5     External Labs: Collected: 06/10/2019 Creatinine 1.06 mg/dL. eGFR: 66 mL/min per 1.73 m Lipid profile: Total cholesterol 304, triglycerides 310, HDL 53, LDL 190 Hemoglobin A1c: 6.3 TSH: 1.26 and T4 0.68 (free)  IMPRESSION:    ICD-10-CM   1. S/P mitral valve replacement  Z95.2 POCT INR  2. Hx of rheumatic fever  Z86.79 POCT INR  3. Paroxysmal atrial fibrillation (HCC)  I48.0 POCT INR  4. Monitoring for  long-term anticoagulant use  Z51.81 POCT INR   Z79.01   5. Chronic HFrEF (heart failure with reduced ejection fraction) (HCC)  I50.22 sacubitril-valsartan (ENTRESTO) 49-51 MG  6. Mixed hyperlipidemia  E78.2   7. Atherosclerosis of native coronary artery of native heart without angina pectoris  I25.10   8. History of coronary angioplasty with insertion of stent  Z95.5   9. Ischemic cardiomyopathy  I25.5   10. History of stroke  Z86.73   11. Benign hypertension  I10   12. Tobacco use  Z72.0   13. Class 2 severe obesity due to excess calories with serious comorbidity and body mass index (BMI) of 39.0 to 39.9 in adult [facility])  E66.01    Z68.39      RECOMMENDATIONS: Joanna Martinez is a 60 y.o. female whose past medical history and cardiac risk factors include: History of atherosclerotic coronary artery disease with prior PCI performed on October 21, 2017 with DES to LCx and [facility] Jude mechanical mitral valve replacement in March 2002 at [facility] for rheumatic mitral stenosis, on oral anticoagulation, hypertension, tobacco smoker, several small strokes, paroxysmal atrial fibrillation (outside records from [facility]), HFrEF, ischemic cardiomyopathy, postmenopausal female, advanced age, obesity.  Atherosclerotic coronary artery disease with prior PCI without angina:  No active chest pain.    Echo results reviewed with her during this office visit -marginal improvement in LVEF.  She wants to hold off on stress testing at this time since her CP and arm pain has resolved.   Encouraged her to stop smoking completely given her multiple cardiovascular comorbidities.  Medications reconciled.  Chronic HFrEF, stage C, NYHA Class II:  Asymptomatic.  Has lost 12 pounds since last office visit.  Has tolerated initiation of Farxiga.  Reviewed echocardiogram with the patient, LVEF has marginally improved compared to prior studies.    Patient is asymptomatic and overall  euvolemic.  Medications reconciled and no additional recommendations provided.  Independently reviewed labs from 07/12/2020.  Kidney function has improved, NT proBNP within normal limits.  Electrolytes within normal limits.  Reemphasize importance of a low-salt diet.    Fluid restriction to <2L per day,  Na restriction < 2g per day  Currently in principle care management for HF management.  Ischemic cardiomyopathy: See above  Atrial fibrillation, paroxysmal: . Rate control: metoprolol . Rhythm control: N/A . Thromboembolic prophylaxis: Currently on coumadin goal INR 2.5 to 3.5. Today's INR is 2.8 . CHA2DS2-VASc SCORE is 6 which correlates to 9.8 % risk of stroke per year. . Patient does not endorse any evidence of bleeding.  Long--term oral anticoagulation:   Indication: Paroxysmal atrial fibrillation and mechanical mitral valve.  Currently on Coumadin.  Patient does not endorse any evidence of bleeding.  INR at today's office 2.8  Status post [facility] Jude mechanical mitral valve secondary to rheumatic mitral valve stenosis in  March 2002:  Continue coumadin.  Patient is scheduled for dental alignment as she currently uses her dentures.  She informs me that she does not need to interrupt her oral anticoagulation.  If she does not she is instructed to call the office for further recommendations.  She will need antibiotic prophylaxis prior to the procedure.  Medications have been sent to the pharmacy.  Left lower extremity swelling:  Resolved.  No evidence of DVT.  Active tobacco smoker:   Tobacco cessation counseling: Currently smoking 5 cigarettes per day   Patient was informed of the dangers of tobacco abuse including stroke, cancer, and MI, as well as benefits of tobacco cessation. Patient is willing to quit at this time. Approximately 5 mins were spent counseling patient cessation techniques. We discussed various methods to help quit smoking, including deciding on a  date to quit, joining a support group, pharmacological agents- nicotine gum/patch/lozenges.  I will reassess her progress at the next follow-up visit  Hyperlipidemia:  Currently managed by primary team.  Continue Lipitor 40 mg p.o. nightly.  Patient does not endorse any myalgias.  Refill provided.  FINAL MEDICATION LIST END OF ENCOUNTER: Meds ordered this encounter  Medications  . sacubitril-valsartan (ENTRESTO) 49-51 MG    Sig: Take 1 tablet by mouth 2 (two) times daily.    Dispense:  60 tablet    Refill:  0     Current Outpatient Medications:  .  allopurinol (ZYLOPRIM) 100 MG tablet, Take 1 tablet by mouth daily. , Disp: , Rfl:  .  ALPRAZolam (XANAX) 0.5 MG tablet, Take 0.5 mg by mouth 2 (two) times daily as needed., Disp: , Rfl:  .  atorvastatin (LIPITOR) 80 MG tablet, Take 1 tablet (80 mg total) by mouth at bedtime., Disp: 90 tablet, Rfl: 1 .  dapagliflozin propanediol (FARXIGA) 10 MG TABS tablet, Take 1 tablet (10 mg total) by mouth daily before breakfast., Disp: 90 tablet, Rfl: 3 .  Diclofenac Sodium 3 % GEL, Apply topically., Disp: , Rfl:  .  DULoxetine (CYMBALTA) 30 MG capsule, Take 30 mg by mouth daily., Disp: , Rfl:  .  ferrous sulfate 325 (65 FE) MG EC tablet, Take 1 tablet by mouth daily., Disp: , Rfl:  .  furosemide (LASIX) 20 MG tablet, Take 0.5 tablets (10 mg total) by mouth every morning. (Patient taking differently: Take 20 mg by mouth every morning.), Disp: 45 tablet, Rfl: 0 .  gabapentin (NEURONTIN) 600 MG tablet, Take 1 capsule by mouth 3 (three) times daily. , Disp: , Rfl:  .  metoprolol succinate (TOPROL-XL) 25 MG 24 hr tablet, TAKE 1 TABLET BY MOUTH ONCE DAILY HOLD  WHEN  SYSTOLIC  IS  LESS  THAN  100  OR  HEARTRATE  LESS  THAN  60  BPM, Disp: 30 tablet,  Rfl: 0 .  naloxone (NARCAN) nasal spray 4 mg/0.1 mL, SMARTSIG:1 Spray(s) Both Nares 1 to 2 Times Daily, Disp: , Rfl:  .  omeprazole (PRILOSEC) 40 MG capsule, Take 1 capsule by mouth daily., Disp: , Rfl:  .   oxyCODONE-acetaminophen (PERCOCET) 7.5-325 MG tablet, Take 1 tablet by mouth 4 (four) times daily as needed., Disp: , Rfl:  .  PROAIR HFA 108 (90 Base) MCG/ACT inhaler, SMARTSIG:1 Puff(s) By Mouth Every 4-6 Hours PRN, Disp: , Rfl:  .  QUEtiapine (SEROQUEL) 400 MG tablet, Take 400 mg by mouth at bedtime., Disp: , Rfl:  .  SUTAB [phone] MG TABS, TAKE 24 TABLETS BY MOUTH PER PREP TEST, Disp: , Rfl:  .  tiZANidine (ZANAFLEX) 4 MG tablet, Take 4 mg by mouth 3 (three) times daily as needed., Disp: , Rfl:  .  Vitamin D, Ergocalciferol, (DRISDOL) 1.25 MG (50000 UNIT) CAPS capsule, Take 50,000 Units by mouth once a week., Disp: , Rfl:  .  warfarin (COUMADIN) 3 MG tablet, Take 1 tablet (3 mg total) by mouth daily at 4 PM. Take as directed by coumadin clinic, Disp: 100 tablet, Rfl: 2 .  clopidogrel (PLAVIX) 75 MG tablet, Take 1 tablet (75 mg total) by mouth daily. (Patient not taking: No sig reported), Disp: 90 tablet, Rfl: 2 .  sacubitril-valsartan (ENTRESTO) 49-51 MG, Take 1 tablet by mouth 2 (two) times daily., Disp: 60 tablet, Rfl: 0  Orders Placed This Encounter  Procedures  . POCT INR    There are no Patient Instructions on file for this visit.   --Continue cardiac medications as reconciled in final medication list. --Return in about 6 months (around 01/14/2021) for Follow up, CAD, heart failure management, mechanical mitral valve.. Or sooner if needed. --Continue follow-up with your primary care physician regarding the management of your other chronic comorbid conditions.  Patient's questions and concerns were addressed to her satisfaction. She voices understanding of the instructions provided during this encounter.   This note was created using a voice recognition software as a result there may be grammatical errors inadvertently enclosed that do not reflect the nature of this encounter. Every attempt is made to correct such errors.   Rex Kras, [place], [facility]  Pager: [phone] Office:  [phone]

## 2020-07-15 ENCOUNTER — Other Ambulatory Visit: Payer: Self-pay | Admitting: Pharmacist

## 2020-07-15 DIAGNOSIS — R0602 Shortness of breath: Secondary | ICD-10-CM

## 2020-07-15 DIAGNOSIS — M7989 Other specified soft tissue disorders: Secondary | ICD-10-CM

## 2020-07-15 MED ORDER — FUROSEMIDE 20 MG PO TABS: 20.0000 mg | ORAL_TABLET | Freq: Every morning | ORAL | 2 refills | Status: DC

## 2020-07-21 ENCOUNTER — Other Ambulatory Visit: Payer: Self-pay | Admitting: Pharmacist

## 2020-07-21 DIAGNOSIS — I2510 Atherosclerotic heart disease of native coronary artery without angina pectoris: Secondary | ICD-10-CM

## 2020-07-21 DIAGNOSIS — I5022 Chronic systolic (congestive) heart failure: Secondary | ICD-10-CM

## 2020-07-21 MED ORDER — METOPROLOL SUCCINATE ER 25 MG PO TB24: 25.0000 mg | ORAL_TABLET | Freq: Every day | ORAL | 2 refills | Status: DC

## 2020-07-25 ENCOUNTER — Other Ambulatory Visit: Payer: Self-pay

## 2020-07-29 ENCOUNTER — Other Ambulatory Visit: Payer: Self-pay | Admitting: Pharmacist

## 2020-07-29 DIAGNOSIS — Z7901 Long term (current) use of anticoagulants: Secondary | ICD-10-CM

## 2020-07-29 DIAGNOSIS — I480 Paroxysmal atrial fibrillation: Secondary | ICD-10-CM

## 2020-07-29 DIAGNOSIS — Z5181 Encounter for therapeutic drug level monitoring: Secondary | ICD-10-CM

## 2020-07-29 MED ORDER — WARFARIN SODIUM 3 MG PO TABS: 6.0000 mg | ORAL_TABLET | Freq: Every day | ORAL | 2 refills | Status: DC

## 2020-08-05 ENCOUNTER — Other Ambulatory Visit: Payer: Self-pay

## 2020-08-05 ENCOUNTER — Ambulatory Visit: Payer: Medicaid Other | Admitting: Pharmacist

## 2020-08-05 DIAGNOSIS — Z952 Presence of prosthetic heart valve: Secondary | ICD-10-CM

## 2020-08-05 DIAGNOSIS — Z5181 Encounter for therapeutic drug level monitoring: Secondary | ICD-10-CM

## 2020-08-05 DIAGNOSIS — Z7901 Long term (current) use of anticoagulants: Secondary | ICD-10-CM

## 2020-08-05 DIAGNOSIS — I480 Paroxysmal atrial fibrillation: Secondary | ICD-10-CM

## 2020-08-05 LAB — POCT INR: INR: 2.4 (ref 2.0–3.0)

## 2020-08-05 NOTE — Patient Instructions (Signed)
INR slightly below goal. Continue taking 6 mg every Mon and Fri and 4.5 mg all other days. Recheck INR in 2 week.

## 2020-08-05 NOTE — Progress Notes (Signed)
Anticoagulation Management Joanna Martinez is a 60 y.o. female who reports to the clinic for monitoring of warfarin treatment.    Indication: atrial fibrillation and s/p Saint Jude mechanical mitral valve replacement in March 2002 ; CHA2DS2 Vasc Score 6 (Female, CHF hx, HTN hx, Stroke/TIA hx, valvular heart disease), HAS-BLED 1 (Stroke hx)   Duration: indefinite Supervising physician: Tessa Lerner  Anticoagulation Clinic Visit History:  Hx of multiple previous strokes. Most recent incidence 3 or 4 years ago. Pt also on Plavix for history of coronary artery disease status post drug-eluting stent in August 2019. Noteded major DDI b/w warfarin and plavix of increased bleeding risk. Noted DDI b/w warfarin and allopurinol about increased anticoagulant effect as well. Pt denies any recent allopurinol dose changes.Pt confirmed to have a tan colored (3 mg) warfarin tablet at home. Pt started taking warfarin in the evening as previously discussed.     Patient does not report signs/symptoms of bleeding or thromboembolism.   Other recent changes: No changes in medications or lifestyle.   Recent UTI complains and pt was started on Macrobid for a 5 day course. Pt reports that she had vaginal bleeds over the past week, but recurrence since. Pt held dose as directed on 08/03/20  Anticoagulation Episode Summary    Current INR goal:  2.5-3.5  TTR:  56.6 % (1 y)  Next INR check:  08/18/2020  INR from last check:  2.4 (08/05/2020)  Weekly max warfarin dose:    Target end date:  Indefinite  INR check location:    Preferred lab:    Send INR reminders to:     Indications   S/P mitral valve replacement [Z95.2] Paroxysmal atrial fibrillation (HCC) [I48.0] Monitoring for long-term anticoagulant use [Z51.81 Z79.01]       Comments:          No Known Allergies  Current Outpatient Medications:  .  allopurinol (ZYLOPRIM) 100 MG tablet, Take 1 tablet by mouth daily. , Disp: , Rfl:  .  ALPRAZolam (XANAX) 0.5  MG tablet, Take 0.5 mg by mouth 2 (two) times daily as needed., Disp: , Rfl:  .  atorvastatin (LIPITOR) 80 MG tablet, Take 1 tablet (80 mg total) by mouth at bedtime., Disp: 90 tablet, Rfl: 1 .  clopidogrel (PLAVIX) 75 MG tablet, Take 1 tablet (75 mg total) by mouth daily. (Patient not taking: No sig reported), Disp: 90 tablet, Rfl: 2 .  dapagliflozin propanediol (FARXIGA) 10 MG TABS tablet, Take 1 tablet (10 mg total) by mouth daily before breakfast., Disp: 90 tablet, Rfl: 3 .  Diclofenac Sodium 3 % GEL, Apply topically., Disp: , Rfl:  .  DULoxetine (CYMBALTA) 30 MG capsule, Take 30 mg by mouth daily., Disp: , Rfl:  .  ferrous sulfate 325 (65 FE) MG EC tablet, Take 1 tablet by mouth daily., Disp: , Rfl:  .  furosemide (LASIX) 20 MG tablet, Take 1 tablet (20 mg total) by mouth every morning., Disp: 90 tablet, Rfl: 2 .  gabapentin (NEURONTIN) 600 MG tablet, Take 1 capsule by mouth 3 (three) times daily. , Disp: , Rfl:  .  hydrOXYzine (ATARAX/VISTARIL) 25 MG tablet, Take 25 mg by mouth 2 (two) times daily as needed., Disp: , Rfl:  .  metoprolol succinate (TOPROL-XL) 25 MG 24 hr tablet, Take 1 tablet (25 mg total) by mouth daily., Disp: 90 tablet, Rfl: 2 .  naloxone (NARCAN) nasal spray 4 mg/0.1 mL, SMARTSIG:1 Spray(s) Both Nares 1 to 2 Times Daily, Disp: , Rfl:  .  nystatin (MYCOSTATIN/NYSTOP) powder, Apply topically 2 (two) times daily., Disp: , Rfl:  .  omeprazole (PRILOSEC) 40 MG capsule, Take 1 capsule by mouth daily., Disp: , Rfl:  .  oxyCODONE-acetaminophen (PERCOCET) 7.5-325 MG tablet, Take 1 tablet by mouth 4 (four) times daily as needed., Disp: , Rfl:  .  PROAIR HFA 108 (90 Base) MCG/ACT inhaler, SMARTSIG:1 Puff(s) By Mouth Every 4-6 Hours PRN, Disp: , Rfl:  .  QUEtiapine (SEROQUEL) 400 MG tablet, Take 400 mg by mouth at bedtime., Disp: , Rfl:  .  sacubitril-valsartan (ENTRESTO) 49-51 MG, Take 1 tablet by mouth 2 (two) times daily., Disp: 60 tablet, Rfl: 0 .  SUTAB [id number] MG TABS, TAKE  24 TABLETS BY MOUTH PER PREP TEST, Disp: , Rfl:  .  tiZANidine (ZANAFLEX) 4 MG tablet, Take 4 mg by mouth 3 (three) times daily as needed., Disp: , Rfl:  .  Vitamin D, Ergocalciferol, (DRISDOL) 1.25 MG (50000 UNIT) CAPS capsule, Take 50,000 Units by mouth once a week., Disp: , Rfl:  .  warfarin (COUMADIN) 3 MG tablet, Take 2 tablets (6 mg total) by mouth daily at 4 PM. Or as directed by the coumadin clinic, Disp: 180 tablet, Rfl: 2 Past Medical History:  Diagnosis Date  . Asthma   . Cardiomyopathy (HCC)   . CHF (congestive heart failure) (HCC)   . Chronic kidney disease   . COPD (chronic obstructive pulmonary disease) (HCC)   . Coronary artery disease   . H/O heart valve replacement with mechanical valve   . Heart attack (HCC)   . Hyperlipidemia   . Hypertension   . Paroxysmal atrial fibrillation (HCC)   . Stroke [facility])     ASSESSMENT  Recent Results: The most recent result is correlated with 34.5 mg per week:  Lab Results  Component Value Date   INR 2.4 08/05/2020   INR 2.8 07/14/2020   INR 3.2 (A) 07/05/2020    Anticoagulation Dosing: Description   INR slightly below goal. Continue taking 6 mg every Mon and Fri and 4.5 mg all other days. Recheck INR in 2 week.      INR today: Subtherapeutic. INR slightly below goal. Recent Abx, vaginal bleeding complains, and planned held dose complicating picture. Pt denies any active bleeds. Finished her course of Abx. Denies any other recent changes in diet, medications, or lifestyle. Pt with recent labile INR readings. Will continue previous maintenance dose and continue close monitoring.   PLAN Weekly dose was unchanged by 0% to 34.5 mg/week. Take 6 mg every Mon and Fri and 4.5 mg all other days. Recheck INR in 2 week.    Patient Instructions  INR slightly below goal. Continue taking 6 mg every Mon and Fri and 4.5 mg all other days. Recheck INR in 2 week.   Patient advised to contact clinic or seek medical attention if  signs/symptoms of bleeding or thromboembolism occur.  Patient verbalized understanding by repeating back information and was advised to contact me if further medication-related questions arise.   Follow-up Return in about 13 days (around 08/18/2020).  Leonides Schanz, PharmD  15 minutes spent face-to-face with the patient during the encounter. 50% of time spent on education, including signs/sx bleeding and clotting, as well as food and drug interactions with warfarin. 50% of time was spent on fingerprick POC INR sample collection,processing, results determination, and documentation

## 2020-08-08 ENCOUNTER — Other Ambulatory Visit: Payer: Self-pay | Admitting: Cardiology

## 2020-08-08 DIAGNOSIS — I5022 Chronic systolic (congestive) heart failure: Secondary | ICD-10-CM

## 2020-08-18 ENCOUNTER — Other Ambulatory Visit: Payer: Self-pay

## 2020-08-18 ENCOUNTER — Ambulatory Visit: Payer: Medicaid Other | Admitting: Pharmacist

## 2020-08-18 DIAGNOSIS — Z5181 Encounter for therapeutic drug level monitoring: Secondary | ICD-10-CM

## 2020-08-18 DIAGNOSIS — I480 Paroxysmal atrial fibrillation: Secondary | ICD-10-CM

## 2020-08-18 DIAGNOSIS — Z952 Presence of prosthetic heart valve: Secondary | ICD-10-CM

## 2020-08-18 LAB — POCT INR: INR: 3.9 — AB (ref 2.0–3.0)

## 2020-08-18 NOTE — Progress Notes (Signed)
Anticoagulation Management Joanna Martinez is a 60 y.o. female who reports to the clinic for monitoring of warfarin treatment.    Indication: atrial fibrillation and s/p Saint Jude mechanical mitral valve replacement in March 2002  ; CHA2DS2 Vasc Score 6 (Female, CHF hx, HTN hx, Stroke/TIA hx, valvular heart disease), HAS-BLED 1 (Stroke hx)   Duration: indefinite Supervising physician: Tessa Lerner  Anticoagulation Clinic Visit History:  Hx of multiple previous strokes. Most recent incidence 3 or 4 years ago. Pt also on Plavix for history of coronary artery disease status post drug-eluting stent in August 2019. Noteded major DDI b/w warfarin and plavix of increased bleeding risk. Noted DDI b/w warfarin and allopurinol about increased anticoagulant effect as well. Pt denies any recent allopurinol dose changes.Pt confirmed to have a tan colored (3 mg) warfarin tablet at home. Pt started taking warfarin in the evening as previously discussed.     Patient does not report signs/symptoms of bleeding or thromboembolism.   Other recent changes: No changes in medications or lifestyle.   Recently finished course of Abx. UTI symptoms resolved. Pt notes to have mild bleeding when she wipes after urinating. Symptoms have persisted for ~1 week. Improving per pt. Has an OV scheduled with PCP tomorrow. Reports that she has 1 serving of cranberry juice over the weekend.   Anticoagulation Episode Summary     Current INR goal:  2.5-3.5  TTR:  56.9 % (1.1 y)  Next INR check:  09/01/2020  INR from last check:  3.9 (08/18/2020)  Weekly max warfarin dose:    Target end date:  Indefinite  INR check location:    Preferred lab:    Send INR reminders to:     Indications   S/P mitral valve replacement [Z95.2] Paroxysmal atrial fibrillation (HCC) [I48.0] Monitoring for long-term anticoagulant use [Z51.81 Z79.01]        Comments:           No Known Allergies  Current Outpatient Medications:    busPIRone  (BUSPAR) 10 MG tablet, Take 1 tablet by mouth in the morning and at bedtime., Disp: , Rfl:    DULoxetine (CYMBALTA) 30 MG capsule, Take 30 mg by mouth daily., Disp: , Rfl:    hydrOXYzine (ATARAX/VISTARIL) 25 MG tablet, Take 25 mg by mouth 2 (two) times daily as needed., Disp: , Rfl:    warfarin (COUMADIN) 3 MG tablet, Take 2 tablets (6 mg total) by mouth daily at 4 PM. Or as directed by the coumadin clinic, Disp: 180 tablet, Rfl: 2   allopurinol (ZYLOPRIM) 100 MG tablet, Take 1 tablet by mouth daily. , Disp: , Rfl:    ALPRAZolam (XANAX) 0.5 MG tablet, Take 0.5 mg by mouth 2 (two) times daily as needed., Disp: , Rfl:    atorvastatin (LIPITOR) 80 MG tablet, Take 1 tablet (80 mg total) by mouth at bedtime., Disp: 90 tablet, Rfl: 1   clopidogrel (PLAVIX) 75 MG tablet, Take 1 tablet (75 mg total) by mouth daily. (Patient not taking: No sig reported), Disp: 90 tablet, Rfl: 2   dapagliflozin propanediol (FARXIGA) 10 MG TABS tablet, Take 1 tablet (10 mg total) by mouth daily before breakfast., Disp: 90 tablet, Rfl: 3   Diclofenac Sodium 3 % GEL, Apply topically., Disp: , Rfl:    ENTRESTO 49-51 MG, Take 1 tablet by mouth twice daily, Disp: 60 tablet, Rfl: 0   ferrous sulfate 325 (65 FE) MG EC tablet, Take 1 tablet by mouth daily., Disp: , Rfl:    furosemide (LASIX)  20 MG tablet, Take 1 tablet (20 mg total) by mouth every morning., Disp: 90 tablet, Rfl: 2   gabapentin (NEURONTIN) 600 MG tablet, Take 1 capsule by mouth 3 (three) times daily. , Disp: , Rfl:    metoprolol succinate (TOPROL-XL) 25 MG 24 hr tablet, Take 1 tablet (25 mg total) by mouth daily., Disp: 90 tablet, Rfl: 2   naloxone (NARCAN) nasal spray 4 mg/0.1 mL, SMARTSIG:1 Spray(s) Both Nares 1 to 2 Times Daily, Disp: , Rfl:    nystatin (MYCOSTATIN/NYSTOP) powder, Apply topically 2 (two) times daily., Disp: , Rfl:    omeprazole (PRILOSEC) 40 MG capsule, Take 1 capsule by mouth daily., Disp: , Rfl:    oxyCODONE-acetaminophen (PERCOCET) 7.5-325 MG  tablet, Take 1 tablet by mouth 4 (four) times daily as needed., Disp: , Rfl:    PROAIR HFA 108 (90 Base) MCG/ACT inhaler, SMARTSIG:1 Puff(s) By Mouth Every 4-6 Hours PRN, Disp: , Rfl:    QUEtiapine (SEROQUEL) 400 MG tablet, Take 400 mg by mouth at bedtime., Disp: , Rfl:    SUTAB [phone] MG TABS, TAKE 24 TABLETS BY MOUTH PER PREP TEST, Disp: , Rfl:    tiZANidine (ZANAFLEX) 4 MG tablet, Take 4 mg by mouth 3 (three) times daily as needed., Disp: , Rfl:    Vitamin D, Ergocalciferol, (DRISDOL) 1.25 MG (50000 UNIT) CAPS capsule, Take 50,000 Units by mouth once a week., Disp: , Rfl:  Past Medical History:  Diagnosis Date   Asthma    Cardiomyopathy (HCC)    CHF (congestive heart failure) (HCC)    Chronic kidney disease    COPD (chronic obstructive pulmonary disease) (HCC)    Coronary artery disease    H/O heart valve replacement with mechanical valve    Heart attack (HCC)    Hyperlipidemia    Hypertension    Paroxysmal atrial fibrillation (HCC)    Stroke [facility])     ASSESSMENT  Recent Results: The most recent result is correlated with 34.5 mg per week:  Lab Results  Component Value Date   INR 3.9 (A) 08/18/2020   INR 2.4 08/05/2020   INR 2.8 07/14/2020    Anticoagulation Dosing: Description   INR above goal. HOLD today and then decrease weekly dose to 6 mg every Mon and 4.5 mg all other days. Recheck INR in 2 week.      INR today: Supratherapeutic. Recent labile INR readings. Concerns of vaginal bleeding. Pt denies complains of hematuria. Denies any other complains of recent medication, lifestyle, or diet changes. Denies any other complains of bleeding or bruising symptoms. Will dose adjust and decrease weekly dose in setting of unclear supratheraputic INR reading and concerns of vaginal bleeding. Will continue close monitoring and follow up closely.   PLAN Weekly dose was decreased by 4.3% to 33 mg/week. HOLD today and then decrease weekly dose to 6 mg every Mon, and 4.5 mg all  other days. Recheck INR in 2 weeks.   Patient Instructions  INR above goal. HOLD today and then decrease weekly dose to 6 mg every Mon and 4.5 mg all other days. Recheck INR in 2 week.  Patient advised to contact clinic or seek medical attention if signs/symptoms of bleeding or thromboembolism occur.  Patient verbalized understanding by repeating back information and was advised to contact me if further medication-related questions arise.   Follow-up Return in about 2 weeks (around 09/01/2020).  Leonides Schanz, PharmD  15 minutes spent face-to-face with the patient during the encounter. 50% of time spent on  education, including signs/sx bleeding and clotting, as well as food and drug interactions with warfarin. 50% of time was spent on fingerprick POC INR sample collection,processing, results determination, and documentation

## 2020-08-18 NOTE — Patient Instructions (Signed)
INR above goal. HOLD today and then decrease weekly dose to 6 mg every Mon and 4.5 mg all other days. Recheck INR in 2 week.

## 2020-08-24 ENCOUNTER — Ambulatory Visit: Payer: Medicaid Other | Admitting: Pharmacist

## 2020-08-24 ENCOUNTER — Other Ambulatory Visit: Payer: Self-pay

## 2020-08-24 DIAGNOSIS — Z5181 Encounter for therapeutic drug level monitoring: Secondary | ICD-10-CM

## 2020-08-24 DIAGNOSIS — Z952 Presence of prosthetic heart valve: Secondary | ICD-10-CM

## 2020-08-24 DIAGNOSIS — Z7901 Long term (current) use of anticoagulants: Secondary | ICD-10-CM

## 2020-08-24 DIAGNOSIS — I480 Paroxysmal atrial fibrillation: Secondary | ICD-10-CM

## 2020-08-24 LAB — POCT INR: INR: 3.7 — AB (ref 2.0–3.0)

## 2020-08-24 NOTE — Progress Notes (Signed)
Anticoagulation Management Joanna Martinez is a 60 y.o. female who reports to the clinic for monitoring of warfarin treatment.    Indication: atrial fibrillation and s/p Saint Jude mechanical mitral valve replacement in March 2002  ; CHA2DS2 Vasc Score 6 (Female, CHF hx, HTN hx, Stroke/TIA hx, valvular heart disease), HAS-BLED 1 (Stroke hx)   Duration: indefinite Supervising physician: Joanna Martinez  Anticoagulation Clinic Visit History:  Hx of multiple previous strokes. Most recent incidence 3 or 4 years ago. Pt also on Plavix for history of coronary artery disease status post drug-eluting stent in August 2019. Noteded major DDI b/w warfarin and plavix of increased bleeding risk. Noted DDI b/w warfarin and allopurinol about increased anticoagulant effect as well. Pt denies any recent allopurinol dose changes.Pt confirmed to have a tan colored (3 mg) warfarin tablet at home. Pt started taking warfarin in the evening as previously discussed.     Patient does not report signs/symptoms of bleeding or thromboembolism.   Other recent changes: No changes in medications or lifestyle.   Pt went to PCP for recurrent UTI symptoms again last week and pt was started on another course of Macrobid starting 08/19/20. Expected end date ~08/26/20. UTI symptoms improving. No recurrent vaginal bleeding concerns. Reports that she had some margarita yesterday for her birthday celebration.   Anticoagulation Episode Summary     Current INR goal:  2.5-3.5  TTR:  56.0 % (1.1 y)  Next INR check:  09/01/2020  INR from last check:  3.7 (08/24/2020)  Weekly max warfarin dose:    Target end date:  Indefinite  INR check location:    Preferred lab:    Send INR reminders to:     Indications   S/P mitral valve replacement [Z95.2] Paroxysmal atrial fibrillation (HCC) [I48.0] Monitoring for long-term anticoagulant use [Z51.81 Z79.01]        Comments:           No Known Allergies  Current Outpatient Medications:     allopurinol (ZYLOPRIM) 100 MG tablet, Take 1 tablet by mouth daily. , Disp: , Rfl:    ALPRAZolam (XANAX) 0.5 MG tablet, Take 0.5 mg by mouth 2 (two) times daily as needed., Disp: , Rfl:    atorvastatin (LIPITOR) 80 MG tablet, Take 1 tablet (80 mg total) by mouth at bedtime., Disp: 90 tablet, Rfl: 1   busPIRone (BUSPAR) 10 MG tablet, Take 1 tablet by mouth in the morning and at bedtime., Disp: , Rfl:    clopidogrel (PLAVIX) 75 MG tablet, Take 1 tablet (75 mg total) by mouth daily. (Patient not taking: No sig reported), Disp: 90 tablet, Rfl: 2   dapagliflozin propanediol (FARXIGA) 10 MG TABS tablet, Take 1 tablet (10 mg total) by mouth daily before breakfast., Disp: 90 tablet, Rfl: 3   Diclofenac Sodium 3 % GEL, Apply topically., Disp: , Rfl:    DULoxetine (CYMBALTA) 30 MG capsule, Take 30 mg by mouth daily., Disp: , Rfl:    ENTRESTO 49-51 MG, Take 1 tablet by mouth twice daily, Disp: 60 tablet, Rfl: 0   ferrous sulfate 325 (65 FE) MG EC tablet, Take 1 tablet by mouth daily., Disp: , Rfl:    furosemide (LASIX) 20 MG tablet, Take 1 tablet (20 mg total) by mouth every morning., Disp: 90 tablet, Rfl: 2   gabapentin (NEURONTIN) 600 MG tablet, Take 1 capsule by mouth 3 (three) times daily. , Disp: , Rfl:    hydrOXYzine (ATARAX/VISTARIL) 25 MG tablet, Take 25 mg by mouth 2 (two) times daily  as needed., Disp: , Rfl:    metoprolol succinate (TOPROL-XL) 25 MG 24 hr tablet, Take 1 tablet (25 mg total) by mouth daily., Disp: 90 tablet, Rfl: 2   naloxone (NARCAN) nasal spray 4 mg/0.1 mL, SMARTSIG:1 Spray(s) Both Nares 1 to 2 Times Daily, Disp: , Rfl:    nystatin (MYCOSTATIN/NYSTOP) powder, Apply topically 2 (two) times daily., Disp: , Rfl:    omeprazole (PRILOSEC) 40 MG capsule, Take 1 capsule by mouth daily., Disp: , Rfl:    oxyCODONE-acetaminophen (PERCOCET) 7.5-325 MG tablet, Take 1 tablet by mouth 4 (four) times daily as needed., Disp: , Rfl:    PROAIR HFA 108 (90 Base) MCG/ACT inhaler, SMARTSIG:1  Puff(s) By Mouth Every 4-6 Hours PRN, Disp: , Rfl:    QUEtiapine (SEROQUEL) 400 MG tablet, Take 400 mg by mouth at bedtime., Disp: , Rfl:    SUTAB [id number] MG TABS, TAKE 24 TABLETS BY MOUTH PER PREP TEST, Disp: , Rfl:    tiZANidine (ZANAFLEX) 4 MG tablet, Take 4 mg by mouth 3 (three) times daily as needed., Disp: , Rfl:    Vitamin D, Ergocalciferol, (DRISDOL) 1.25 MG (50000 UNIT) CAPS capsule, Take 50,000 Units by mouth once a week., Disp: , Rfl:    warfarin (COUMADIN) 3 MG tablet, Take 2 tablets (6 mg total) by mouth daily at 4 PM. Or as directed by the coumadin clinic (Patient taking differently: Take 6 mg by mouth daily at 4 PM. Or as directed by the coumadin clinic  Currently on: 6 mg every Mon; 4.5 mg all other days), Disp: 180 tablet, Rfl: 2 Past Medical History:  Diagnosis Date   Asthma    Cardiomyopathy (HCC)    CHF (congestive heart failure) (HCC)    Chronic kidney disease    COPD (chronic obstructive pulmonary disease) (HCC)    Coronary artery disease    H/O heart valve replacement with mechanical valve    Heart attack (HCC)    Hyperlipidemia    Hypertension    Paroxysmal atrial fibrillation (HCC)    Stroke [facility])     ASSESSMENT  Recent Results: The most recent result is correlated with 28.5 mg per week:  Lab Results  Component Value Date   INR 3.7 (A) 08/24/2020   INR 3.9 (A) 08/18/2020   INR 2.4 08/05/2020    Anticoagulation Dosing: Description   INR above goal. Continue taking 6 mg every Mon and 4.5 mg all other days. Recheck INR in 1 week.      INR today: Supratherapeutic. INR trending down since recent weekly dose decrease and dose adjustment. Recent EtOH intake might be contributing to slight increased anticoagulation effect. Denies any bleeding or bruising symptoms. Denies any other relevant changes in diet, medications, or lifestyle. Recent weekly dose decrease and thus will continue close monitoring. If INR remain elevated at next check, will consider  decreasing weekly dose.   PLAN Weekly dose was unchanged by 0% to 33 mg/week. Continue taking 6 mg every Mon, and 4.5 mg all other days. Recheck INR in 1 weeks.   Patient Instructions  INR above goal. Continue taking 6 mg every Mon and 4.5 mg all other days. Recheck INR in 1 week.  Patient advised to contact clinic or seek medical attention if signs/symptoms of bleeding or thromboembolism occur.  Patient verbalized understanding by repeating back information and was advised to contact me if further medication-related questions arise.   Follow-up Return in about 8 days (around 09/01/2020).  Leonides Schanz, PharmD  15 minutes spent  face-to-face with the patient during the encounter. 50% of time spent on education, including signs/sx bleeding and clotting, as well as food and drug interactions with warfarin. 50% of time was spent on fingerprick POC INR sample collection,processing, results determination, and documentation

## 2020-08-24 NOTE — Patient Instructions (Signed)
INR above goal. Continue taking 6 mg every Mon and 4.5 mg all other days. Recheck INR in 1 week.

## 2020-09-01 ENCOUNTER — Ambulatory Visit: Payer: Medicaid Other | Admitting: Pharmacist

## 2020-09-01 ENCOUNTER — Other Ambulatory Visit: Payer: Self-pay

## 2020-09-01 DIAGNOSIS — I480 Paroxysmal atrial fibrillation: Secondary | ICD-10-CM

## 2020-09-01 DIAGNOSIS — Z5181 Encounter for therapeutic drug level monitoring: Secondary | ICD-10-CM

## 2020-09-01 DIAGNOSIS — Z952 Presence of prosthetic heart valve: Secondary | ICD-10-CM

## 2020-09-01 LAB — POCT INR: INR: 3.1 — AB (ref 2.0–3.0)

## 2020-09-01 NOTE — Progress Notes (Signed)
Anticoagulation Management Joanna Martinez is a 60 y.o. female who reports to the clinic for monitoring of warfarin treatment.    Indication: atrial fibrillation and s/p Saint Jude mechanical mitral valve replacement in March 2002  ; CHA2DS2 Vasc Score 6 (Female, CHF hx, HTN hx, Stroke/TIA hx, valvular heart disease), HAS-BLED 1 (Stroke hx)   Duration: indefinite Supervising physician: Tessa Lerner  Anticoagulation Clinic Visit History:  Hx of multiple previous strokes. Most recent incidence 3 or 4 years ago. Pt also on Plavix for history of coronary artery disease status post drug-eluting stent in August 2019. Noteded major DDI b/w warfarin and plavix of increased bleeding risk. Noted DDI b/w warfarin and allopurinol about increased anticoagulant effect as well. Pt denies any recent allopurinol dose changes.Pt confirmed to have a tan colored (3 mg) warfarin tablet at home. Pt started taking warfarin in the evening as previously discussed.     Patient does not report signs/symptoms of bleeding or thromboembolism.   Other recent changes: No changes in medications or lifestyle.   Finished her course of Abx. UTI symptoms resolved.   Anticoagulation Episode Summary     Current INR goal:  2.5-3.5  TTR:  56.2 % (1.1 y)  Next INR check:  09/22/2020  INR from last check:  3.1 (09/01/2020)  Weekly max warfarin dose:    Target end date:  Indefinite  INR check location:    Preferred lab:    Send INR reminders to:     Indications   S/P mitral valve replacement [Z95.2] Paroxysmal atrial fibrillation (HCC) [I48.0] Monitoring for long-term anticoagulant use [Z51.81 Z79.01]        Comments:           No Known Allergies  Current Outpatient Medications:    allopurinol (ZYLOPRIM) 100 MG tablet, Take 1 tablet by mouth daily. , Disp: , Rfl:    ALPRAZolam (XANAX) 0.5 MG tablet, Take 0.5 mg by mouth 2 (two) times daily as needed., Disp: , Rfl:    atorvastatin (LIPITOR) 80 MG tablet, Take 1  tablet (80 mg total) by mouth at bedtime., Disp: 90 tablet, Rfl: 1   busPIRone (BUSPAR) 10 MG tablet, Take 1 tablet by mouth in the morning and at bedtime., Disp: , Rfl:    clopidogrel (PLAVIX) 75 MG tablet, Take 1 tablet (75 mg total) by mouth daily. (Patient not taking: No sig reported), Disp: 90 tablet, Rfl: 2   dapagliflozin propanediol (FARXIGA) 10 MG TABS tablet, Take 1 tablet (10 mg total) by mouth daily before breakfast., Disp: 90 tablet, Rfl: 3   Diclofenac Sodium 3 % GEL, Apply topically., Disp: , Rfl:    DULoxetine (CYMBALTA) 30 MG capsule, Take 30 mg by mouth daily., Disp: , Rfl:    ENTRESTO 49-51 MG, Take 1 tablet by mouth twice daily, Disp: 60 tablet, Rfl: 0   ferrous sulfate 325 (65 FE) MG EC tablet, Take 1 tablet by mouth daily., Disp: , Rfl:    furosemide (LASIX) 20 MG tablet, Take 1 tablet (20 mg total) by mouth every morning., Disp: 90 tablet, Rfl: 2   gabapentin (NEURONTIN) 600 MG tablet, Take 1 capsule by mouth 3 (three) times daily. , Disp: , Rfl:    hydrOXYzine (ATARAX/VISTARIL) 25 MG tablet, Take 25 mg by mouth 2 (two) times daily as needed., Disp: , Rfl:    metoprolol succinate (TOPROL-XL) 25 MG 24 hr tablet, Take 1 tablet (25 mg total) by mouth daily., Disp: 90 tablet, Rfl: 2   naloxone (NARCAN) nasal spray 4  mg/0.1 mL, SMARTSIG:1 Spray(s) Both Nares 1 to 2 Times Daily, Disp: , Rfl:    nystatin (MYCOSTATIN/NYSTOP) powder, Apply topically 2 (two) times daily., Disp: , Rfl:    omeprazole (PRILOSEC) 40 MG capsule, Take 1 capsule by mouth daily., Disp: , Rfl:    oxyCODONE-acetaminophen (PERCOCET) 7.5-325 MG tablet, Take 1 tablet by mouth 4 (four) times daily as needed., Disp: , Rfl:    PROAIR HFA 108 (90 Base) MCG/ACT inhaler, SMARTSIG:1 Puff(s) By Mouth Every 4-6 Hours PRN, Disp: , Rfl:    QUEtiapine (SEROQUEL) 400 MG tablet, Take 400 mg by mouth at bedtime., Disp: , Rfl:    SUTAB [phone] MG TABS, TAKE 24 TABLETS BY MOUTH PER PREP TEST, Disp: , Rfl:    tiZANidine  (ZANAFLEX) 4 MG tablet, Take 4 mg by mouth 3 (three) times daily as needed., Disp: , Rfl:    Vitamin D, Ergocalciferol, (DRISDOL) 1.25 MG (50000 UNIT) CAPS capsule, Take 50,000 Units by mouth once a week., Disp: , Rfl:    warfarin (COUMADIN) 3 MG tablet, Take 2 tablets (6 mg total) by mouth daily at 4 PM. Or as directed by the coumadin clinic (Patient taking differently: Take 6 mg by mouth daily at 4 PM. Or as directed by the coumadin clinic  Currently on: 6 mg every Mon; 4.5 mg all other days), Disp: 180 tablet, Rfl: 2 Past Medical History:  Diagnosis Date   Asthma    Cardiomyopathy (HCC)    CHF (congestive heart failure) (HCC)    Chronic kidney disease    COPD (chronic obstructive pulmonary disease) (HCC)    Coronary artery disease    H/O heart valve replacement with mechanical valve    Heart attack (HCC)    Hyperlipidemia    Hypertension    Paroxysmal atrial fibrillation (HCC)    Stroke [facility])     ASSESSMENT  Recent Results: The most recent result is correlated with 33 mg per week:  Lab Results  Component Value Date   INR 3.1 (A) 09/01/2020   INR 3.7 (A) 08/24/2020   INR 3.9 (A) 08/18/2020    Anticoagulation Dosing: Description   INR at goal. Continue taking 6 mg every Mon and 4.5 mg all other days. Recheck INR in 3 week.      INR today: Therapeutic. INR back within therapeutic range after finishing her Abx. Denies any bleeding or bruising symptoms. Denies any other relevant changes in diet, medications, or lifestyle. Denies any s/sx of thromboembolic symptoms. Will continue current weekly dose and close monitoring.   PLAN Weekly dose was unchanged by 0% to 33 mg/week. Continue taking 6 mg every Mon, and 4.5 mg all other days. Recheck INR in 3 weeks.   Patient Instructions  INR at goal. Continue taking 6 mg every Mon and 4.5 mg all other days. Recheck INR in 3 week.  Patient advised to contact clinic or seek medical attention if signs/symptoms of bleeding or  thromboembolism occur.  Patient verbalized understanding by repeating back information and was advised to contact me if further medication-related questions arise.   Follow-up Return in about 3 weeks (around 09/22/2020).  Leonides Schanz, PharmD  15 minutes spent face-to-face with the patient during the encounter. 50% of time spent on education, including signs/sx bleeding and clotting, as well as food and drug interactions with warfarin. 50% of time was spent on fingerprick POC INR sample collection,processing, results determination, and documentation

## 2020-09-01 NOTE — Patient Instructions (Signed)
INR at goal. Continue taking 6 mg every Mon and 4.5 mg all other days. Recheck INR in 3 week.

## 2020-09-14 ENCOUNTER — Other Ambulatory Visit: Payer: Self-pay | Admitting: Cardiology

## 2020-09-14 DIAGNOSIS — I5022 Chronic systolic (congestive) heart failure: Secondary | ICD-10-CM

## 2020-09-14 NOTE — Telephone Encounter (Signed)
error 

## 2020-09-22 ENCOUNTER — Other Ambulatory Visit: Payer: Self-pay

## 2020-09-22 ENCOUNTER — Ambulatory Visit: Payer: Medicaid Other | Admitting: Pharmacist

## 2020-09-22 DIAGNOSIS — I480 Paroxysmal atrial fibrillation: Secondary | ICD-10-CM

## 2020-09-22 DIAGNOSIS — Z5181 Encounter for therapeutic drug level monitoring: Secondary | ICD-10-CM

## 2020-09-22 DIAGNOSIS — Z952 Presence of prosthetic heart valve: Secondary | ICD-10-CM

## 2020-09-22 DIAGNOSIS — Z7901 Long term (current) use of anticoagulants: Secondary | ICD-10-CM

## 2020-09-22 LAB — POCT INR: INR: 4.5 — AB (ref 2.0–3.0)

## 2020-09-22 NOTE — Progress Notes (Signed)
Anticoagulation Management Joanna Martinez is a 60 y.o. female who reports to the clinic for monitoring of warfarin treatment.    Indication: atrial fibrillation and s/p Saint Jude mechanical mitral valve replacement in March 2002  ; CHA2DS2 Vasc Score 6 (Female, CHF hx, HTN hx, Stroke/TIA hx, valvular heart disease), HAS-BLED 1 (Stroke hx)   Duration: indefinite Supervising physician: Tessa Lerner  Anticoagulation Clinic Visit History:  Hx of multiple previous strokes. Most recent incidence 3 or 4 years ago. Pt also on Plavix for history of coronary artery disease status post drug-eluting stent in August 2019. Noteded major DDI b/w warfarin and plavix of increased bleeding risk. Noted DDI b/w warfarin and allopurinol about increased anticoagulant effect as well. Pt denies any recent allopurinol dose changes.Pt confirmed to have a tan colored (3 mg) warfarin tablet at home. Pt started taking warfarin in the evening as previously discussed.     Patient does not report signs/symptoms of bleeding or thromboembolism.   Other recent changes: No changes in medications or lifestyle.   Pt reports having an episode of possible dark stool 2 weeks ago. Denies any abdominal pain, hematochezia, recurrent melena. PT reports that she had recurrent UTI and was started on macrobid. Pt noted episode of mild hematuria while wiping. Pt reports that she is scheduled to follow up with GI provider and with PCP to review UTI and possible GI bleeding concerns.   Anticoagulation Episode Summary     Current INR goal:  2.5-3.5  TTR:  54.9 % (1.2 y)  Next INR check:  10/06/2020  INR from last check:  4.5 (09/22/2020)  Weekly max warfarin dose:    Target end date:  Indefinite  INR check location:    Preferred lab:    Send INR reminders to:     Indications   S/P mitral valve replacement [Z95.2] Paroxysmal atrial fibrillation (HCC) [I48.0] Monitoring for long-term anticoagulant use [Z51.81 Z79.01]        Comments:            No Known Allergies  Current Outpatient Medications:    nitrofurantoin (MACRODANTIN) 100 MG capsule, Take 100 mg by mouth 2 (two) times daily., Disp: , Rfl:    allopurinol (ZYLOPRIM) 100 MG tablet, Take 1 tablet by mouth daily. , Disp: , Rfl:    ALPRAZolam (XANAX) 0.5 MG tablet, Take 0.5 mg by mouth 2 (two) times daily as needed., Disp: , Rfl:    atorvastatin (LIPITOR) 80 MG tablet, Take 1 tablet (80 mg total) by mouth at bedtime., Disp: 90 tablet, Rfl: 1   busPIRone (BUSPAR) 10 MG tablet, Take 1 tablet by mouth in the morning and at bedtime., Disp: , Rfl:    clopidogrel (PLAVIX) 75 MG tablet, Take 1 tablet (75 mg total) by mouth daily. (Patient not taking: No sig reported), Disp: 90 tablet, Rfl: 2   dapagliflozin propanediol (FARXIGA) 10 MG TABS tablet, Take 1 tablet (10 mg total) by mouth daily before breakfast., Disp: 90 tablet, Rfl: 3   Diclofenac Sodium 3 % GEL, Apply topically., Disp: , Rfl:    DULoxetine (CYMBALTA) 30 MG capsule, Take 30 mg by mouth daily., Disp: , Rfl:    ENTRESTO 49-51 MG, TAKE ONE TABLET BY MOUTH TWICE DAILY, Disp: 60 tablet, Rfl: 5   ferrous sulfate 325 (65 FE) MG EC tablet, Take 1 tablet by mouth daily., Disp: , Rfl:    furosemide (LASIX) 20 MG tablet, Take 1 tablet (20 mg total) by mouth every morning., Disp: 90 tablet, Rfl: 2  gabapentin (NEURONTIN) 600 MG tablet, Take 1 capsule by mouth 3 (three) times daily. , Disp: , Rfl:    hydrOXYzine (ATARAX/VISTARIL) 25 MG tablet, Take 25 mg by mouth 2 (two) times daily as needed., Disp: , Rfl:    metoprolol succinate (TOPROL-XL) 25 MG 24 hr tablet, Take 1 tablet (25 mg total) by mouth daily., Disp: 90 tablet, Rfl: 2   naloxone (NARCAN) nasal spray 4 mg/0.1 mL, SMARTSIG:1 Spray(s) Both Nares 1 to 2 Times Daily, Disp: , Rfl:    nystatin (MYCOSTATIN/NYSTOP) powder, Apply topically 2 (two) times daily., Disp: , Rfl:    omeprazole (PRILOSEC) 40 MG capsule, Take 1 capsule by mouth daily., Disp: , Rfl:     oxyCODONE-acetaminophen (PERCOCET) 7.5-325 MG tablet, Take 1 tablet by mouth 4 (four) times daily as needed., Disp: , Rfl:    PROAIR HFA 108 (90 Base) MCG/ACT inhaler, SMARTSIG:1 Puff(s) By Mouth Every 4-6 Hours PRN, Disp: , Rfl:    QUEtiapine (SEROQUEL) 400 MG tablet, Take 400 mg by mouth at bedtime., Disp: , Rfl:    SUTAB [phone] MG TABS, TAKE 24 TABLETS BY MOUTH PER PREP TEST, Disp: , Rfl:    tiZANidine (ZANAFLEX) 4 MG tablet, Take 4 mg by mouth 3 (three) times daily as needed., Disp: , Rfl:    Vitamin D, Ergocalciferol, (DRISDOL) 1.25 MG (50000 UNIT) CAPS capsule, Take 50,000 Units by mouth once a week., Disp: , Rfl:    warfarin (COUMADIN) 3 MG tablet, Take 2 tablets (6 mg total) by mouth daily at 4 PM. Or as directed by the coumadin clinic (Patient taking differently: Take 6 mg by mouth daily at 4 PM. Or as directed by the coumadin clinic  Currently on: 6 mg every Mon; 4.5 mg all other days), Disp: 180 tablet, Rfl: 2 Past Medical History:  Diagnosis Date   Asthma    Cardiomyopathy (HCC)    CHF (congestive heart failure) (HCC)    Chronic kidney disease    COPD (chronic obstructive pulmonary disease) (HCC)    Coronary artery disease    H/O heart valve replacement with mechanical valve    Heart attack (HCC)    Hyperlipidemia    Hypertension    Paroxysmal atrial fibrillation (HCC)    Stroke [facility])     ASSESSMENT  Recent Results: The most recent result is correlated with 33 mg per week:  Lab Results  Component Value Date   INR 4.5 (A) 09/22/2020   INR 3.1 (A) 09/01/2020   INR 3.7 (A) 08/24/2020    Anticoagulation Dosing: Description   INR above goal. HOLD today and then decrease weekly dose to 4.5 mg every day. Recheck INR in 2 week.      INR today: Supratherapeutic. Unclear reason for supratheraputic reading. Previously stable on current dose. In setting of recent uncertain bleeding concerns and supratheraputic reading, will decrease weekly dose and continue close  monitoring. Pt denies any recurrence since. Pt aware to follow up with GI and PCP closely. Pt denies any relevance any other relevant changes in diet, medications, or lifestyle. Will decrease weekly dose and continue close monitoring.   PLAN Weekly dose was decreased by 4.5% to 31.5 mg/week. HOLD today, and then decrease weekly dose to 4.5 mg every day. Recheck INR in 2 week.   Patient Instructions  INR above goal. HOLD today and then decrease weekly dose to 4.5 mg every day. Recheck INR in 2 week.  Patient advised to contact clinic or seek medical attention if signs/symptoms of bleeding or  thromboembolism occur.  Patient verbalized understanding by repeating back information and was advised to contact me if further medication-related questions arise.   Follow-up Return in about 2 weeks (around 10/06/2020).  Leonides Schanz, PharmD  15 minutes spent face-to-face with the patient during the encounter. 50% of time spent on education, including signs/sx bleeding and clotting, as well as food and drug interactions with warfarin. 50% of time was spent on fingerprick POC INR sample collection,processing, results determination, and documentation

## 2020-09-22 NOTE — Patient Instructions (Signed)
INR above goal. HOLD today and then decrease weekly dose to 4.5 mg every day. Recheck INR in 2 week.

## 2020-10-06 ENCOUNTER — Ambulatory Visit: Payer: Medicaid Other | Admitting: Pharmacist

## 2020-10-06 ENCOUNTER — Ambulatory Visit: Payer: Medicaid Other

## 2020-10-06 ENCOUNTER — Other Ambulatory Visit: Payer: Self-pay

## 2020-10-06 DIAGNOSIS — Z5181 Encounter for therapeutic drug level monitoring: Secondary | ICD-10-CM

## 2020-10-06 DIAGNOSIS — Z7901 Long term (current) use of anticoagulants: Secondary | ICD-10-CM

## 2020-10-06 DIAGNOSIS — I480 Paroxysmal atrial fibrillation: Secondary | ICD-10-CM

## 2020-10-06 DIAGNOSIS — Z952 Presence of prosthetic heart valve: Secondary | ICD-10-CM

## 2020-10-06 LAB — POCT INR: INR: 5.3 — AB (ref 2.0–3.0)

## 2020-10-06 NOTE — Progress Notes (Signed)
Anticoagulation Management Joanna Martinez is a 60 y.o. female who reports to the clinic for monitoring of warfarin treatment.    Indication: atrial fibrillation and s/p Saint Jude mechanical mitral valve replacement in March 2002  ; CHA2DS2 Vasc Score 6 (Female, CHF hx, HTN hx, Stroke/TIA hx, valvular heart disease), HAS-BLED 1 (Stroke hx)   Duration: indefinite Supervising physician: Tessa Lerner  Anticoagulation Clinic Visit History:  Hx of multiple previous strokes. Most recent incidence 3 or 4 years ago. Pt also on Plavix for history of coronary artery disease status post drug-eluting stent in August 2019. Noteded major DDI b/w warfarin and plavix of increased bleeding risk. Noted DDI b/w warfarin and allopurinol about increased anticoagulant effect as well. Pt denies any recent allopurinol dose changes.Pt confirmed to have a tan colored (3 mg) warfarin tablet at home. Pt started taking warfarin in the evening as previously discussed.     Patient does not report signs/symptoms of bleeding or thromboembolism.   Other recent changes: No changes in medications or lifestyle.   Pt was recently diagnosed with recurrent UTI and was started on 7 day course of doxycyline. Last dose ~10/05/20. Pt reports that her concerns of possible hematochezia and melena has improved. Pt recently was evaluated by a GI provider and was recommended to get repeat colonoscopy. Colonoscopy scheduled for Aug 25th. Cardiac clearance from Dr. Odis Hollingshead approved.  Anticoagulation Episode Summary     Current INR goal:  2.5-3.5  TTR:  53.2 % (1.2 y)  Next INR check:  10/13/2020  INR from last check:  5.3 (10/06/2020)  Weekly max warfarin dose:    Target end date:  Indefinite  INR check location:    Preferred lab:    Send INR reminders to:     Indications   S/P mitral valve replacement [Z95.2] Paroxysmal atrial fibrillation (HCC) [I48.0] Monitoring for long-term anticoagulant use [Z51.81 Z79.01]        Comments:            No Known Allergies  Current Outpatient Medications:    allopurinol (ZYLOPRIM) 100 MG tablet, Take 1 tablet by mouth daily. , Disp: , Rfl:    ALPRAZolam (XANAX) 0.5 MG tablet, Take 0.5 mg by mouth 2 (two) times daily as needed., Disp: , Rfl:    atorvastatin (LIPITOR) 80 MG tablet, Take 1 tablet (80 mg total) by mouth at bedtime., Disp: 90 tablet, Rfl: 1   busPIRone (BUSPAR) 10 MG tablet, Take 1 tablet by mouth in the morning and at bedtime., Disp: , Rfl:    clopidogrel (PLAVIX) 75 MG tablet, Take 1 tablet (75 mg total) by mouth daily. (Patient not taking: No sig reported), Disp: 90 tablet, Rfl: 2   dapagliflozin propanediol (FARXIGA) 10 MG TABS tablet, Take 1 tablet (10 mg total) by mouth daily before breakfast., Disp: 90 tablet, Rfl: 3   Diclofenac Sodium 3 % GEL, Apply topically., Disp: , Rfl:    DULoxetine (CYMBALTA) 30 MG capsule, Take 30 mg by mouth daily., Disp: , Rfl:    ENTRESTO 49-51 MG, TAKE ONE TABLET BY MOUTH TWICE DAILY, Disp: 60 tablet, Rfl: 5   ferrous sulfate 325 (65 FE) MG EC tablet, Take 1 tablet by mouth daily., Disp: , Rfl:    furosemide (LASIX) 20 MG tablet, Take 1 tablet (20 mg total) by mouth every morning., Disp: 90 tablet, Rfl: 2   gabapentin (NEURONTIN) 600 MG tablet, Take 1 capsule by mouth 3 (three) times daily. , Disp: , Rfl:    hydrOXYzine (ATARAX/VISTARIL) 25  MG tablet, Take 25 mg by mouth 2 (two) times daily as needed., Disp: , Rfl:    metoprolol succinate (TOPROL-XL) 25 MG 24 hr tablet, Take 1 tablet (25 mg total) by mouth daily., Disp: 90 tablet, Rfl: 2   naloxone (NARCAN) nasal spray 4 mg/0.1 mL, SMARTSIG:1 Spray(s) Both Nares 1 to 2 Times Daily, Disp: , Rfl:    nitrofurantoin (MACRODANTIN) 100 MG capsule, Take 100 mg by mouth 2 (two) times daily., Disp: , Rfl:    nystatin (MYCOSTATIN/NYSTOP) powder, Apply topically 2 (two) times daily., Disp: , Rfl:    omeprazole (PRILOSEC) 40 MG capsule, Take 1 capsule by mouth daily., Disp: , Rfl:     oxyCODONE-acetaminophen (PERCOCET) 7.5-325 MG tablet, Take 1 tablet by mouth 4 (four) times daily as needed., Disp: , Rfl:    PROAIR HFA 108 (90 Base) MCG/ACT inhaler, SMARTSIG:1 Puff(s) By Mouth Every 4-6 Hours PRN, Disp: , Rfl:    QUEtiapine (SEROQUEL) 400 MG tablet, Take 400 mg by mouth at bedtime., Disp: , Rfl:    SUTAB [phone] MG TABS, TAKE 24 TABLETS BY MOUTH PER PREP TEST, Disp: , Rfl:    tiZANidine (ZANAFLEX) 4 MG tablet, Take 4 mg by mouth 3 (three) times daily as needed., Disp: , Rfl:    Vitamin D, Ergocalciferol, (DRISDOL) 1.25 MG (50000 UNIT) CAPS capsule, Take 50,000 Units by mouth once a week., Disp: , Rfl:    warfarin (COUMADIN) 3 MG tablet, Take 2 tablets (6 mg total) by mouth daily at 4 PM. Or as directed by the coumadin clinic (Patient taking differently: Take 6 mg by mouth daily at 4 PM. Or as directed by the coumadin clinic  Currently on: 6 mg every Mon; 4.5 mg all other days), Disp: 180 tablet, Rfl: 2 Past Medical History:  Diagnosis Date   Asthma    Cardiomyopathy (HCC)    CHF (congestive heart failure) (HCC)    Chronic kidney disease    COPD (chronic obstructive pulmonary disease) (HCC)    Coronary artery disease    H/O heart valve replacement with mechanical valve    Heart attack (HCC)    Hyperlipidemia    Hypertension    Paroxysmal atrial fibrillation (HCC)    Stroke [facility])     ASSESSMENT  Recent Results: The most recent result is correlated with 31.5 mg per week:  Lab Results  Component Value Date   INR 5.3 (A) 10/06/2020   INR 4.5 (A) 09/22/2020   INR 3.1 (A) 09/01/2020    Anticoagulation Dosing: Description   INR above goal. HOLD today and tomorrow then take 3 mg Tues and Sat and 4.5 mg all other days. Recheck INR in 1 week.      INR today: Supratherapeutic. Possibly in setting of recent doxycycline use. Recent weekly dose decreased. In setting of recent uncertain bleeding concerns and supratheraputic reading, will continue dose adjustment and  continue close monitor. No noted complains of bleeding or bruising symptoms. No other relevant changes in diet, medications, or lifestyles. Due to pt's high thromboembolic risk, would need lovenox bridging. Will review hold and bridge direction with pt in detail at next INR check.   PLAN Weekly dose was unchanged by 0% to 31.5 mg/week. HOLD today and tomorrow, and then take 3 mg Tues and Sat and 4.5 mg all other days. Recheck INR in 1 week.   Patient Instructions  INR above goal. HOLD today and tomorrow then take 3 mg Tues and Sat and 4.5 mg all other days. Recheck INR  in 1 week.  Patient advised to contact clinic or seek medical attention if signs/symptoms of bleeding or thromboembolism occur.  Patient verbalized understanding by repeating back information and was advised to contact me if further medication-related questions arise.   Follow-up Return in about 1 week (around 10/13/2020).  Leonides Schanz, PharmD  15 minutes spent face-to-face with the patient during the encounter. 50% of time spent on education, including signs/sx bleeding and clotting, as well as food and drug interactions with warfarin. 50% of time was spent on fingerprick POC INR sample collection,processing, results determination, and documentation

## 2020-10-11 NOTE — Patient Instructions (Signed)
INR above goal. HOLD today and tomorrow then take 3 mg Tues and Sat and 4.5 mg all other days. Recheck INR in 1 week.

## 2020-10-13 ENCOUNTER — Other Ambulatory Visit: Payer: Self-pay | Admitting: Pharmacist

## 2020-10-13 ENCOUNTER — Other Ambulatory Visit: Payer: Self-pay

## 2020-10-13 ENCOUNTER — Ambulatory Visit: Payer: Medicaid Other | Admitting: Pharmacist

## 2020-10-13 DIAGNOSIS — Z7901 Long term (current) use of anticoagulants: Secondary | ICD-10-CM

## 2020-10-13 DIAGNOSIS — I480 Paroxysmal atrial fibrillation: Secondary | ICD-10-CM

## 2020-10-13 DIAGNOSIS — Z952 Presence of prosthetic heart valve: Secondary | ICD-10-CM

## 2020-10-13 DIAGNOSIS — Z5181 Encounter for therapeutic drug level monitoring: Secondary | ICD-10-CM

## 2020-10-13 LAB — POCT INR: INR: 3.8 — AB (ref 2.0–3.0)

## 2020-10-13 NOTE — Patient Instructions (Signed)
INR above goal. Decrease weekly dose to 3 mg every Tue, Thu, Sat; 4.5 mg all other days. Recheck INR in 1 week.

## 2020-10-13 NOTE — Progress Notes (Signed)
Anticoagulation Management Joanna Martinez is a 60 y.o. female who reports to the clinic for monitoring of warfarin treatment.    Indication: atrial fibrillation and s/p Saint Jude mechanical mitral valve replacement in March 2002  ; CHA2DS2 Vasc Score 6 (Female, CHF hx, HTN hx, Stroke/TIA hx, valvular heart disease), HAS-BLED 1 (Stroke hx)   Duration: indefinite Supervising physician: Tessa Lerner  Anticoagulation Clinic Visit History:  Hx of multiple previous strokes. Most recent incidence 3 or 4 years ago. Pt also on Plavix for history of coronary artery disease status post drug-eluting stent in August 2019. Noteded major DDI b/w warfarin and plavix of increased bleeding risk. Noted DDI b/w warfarin and allopurinol about increased anticoagulant effect as well. Pt denies any recent allopurinol dose changes.Pt confirmed to have a tan colored (3 mg) warfarin tablet at home. Pt started taking warfarin in the evening as previously discussed.     Patient does not report signs/symptoms of bleeding or thromboembolism.   Other recent changes: No changes in medications or lifestyle.   Pt was recently diagnosed with recurrent UTI and was started on 7 day course of doxycyline. Last dose ~10/05/20. Pt reports that her concerns of possible hematochezia and melena has improved. Pt recently was evaluated by a GI provider and was recommended to get repeat colonoscopy. Colonoscopy scheduled for Aug 25th. Cardiac clearance from Dr. Odis Hollingshead approved.  Pt reports that she is having worsening memory concerns and is going to follow up with her PCP. Pt to discuss with PCP about her concerns of repeat colonoscopy as well.   Anticoagulation Episode Summary     Current INR goal:  2.5-3.5  TTR:  52.4 % (1.2 y)  Next INR check:  10/21/2020  INR from last check:  3.8 (10/13/2020)  Weekly max warfarin dose:    Target end date:  Indefinite  INR check location:    Preferred lab:    Send INR reminders to:     Indications    S/P mitral valve replacement [Z95.2] Paroxysmal atrial fibrillation (HCC) [I48.0] Monitoring for long-term anticoagulant use [Z51.81 Z79.01]        Comments:           No Known Allergies  Current Outpatient Medications:    allopurinol (ZYLOPRIM) 100 MG tablet, Take 1 tablet by mouth daily. , Disp: , Rfl:    ALPRAZolam (XANAX) 0.5 MG tablet, Take 0.5 mg by mouth 2 (two) times daily as needed., Disp: , Rfl:    atorvastatin (LIPITOR) 80 MG tablet, Take 1 tablet (80 mg total) by mouth at bedtime., Disp: 90 tablet, Rfl: 1   busPIRone (BUSPAR) 10 MG tablet, Take 1 tablet by mouth in the morning and at bedtime., Disp: , Rfl:    clopidogrel (PLAVIX) 75 MG tablet, Take 1 tablet (75 mg total) by mouth daily. (Patient not taking: No sig reported), Disp: 90 tablet, Rfl: 2   dapagliflozin propanediol (FARXIGA) 10 MG TABS tablet, Take 1 tablet (10 mg total) by mouth daily before breakfast., Disp: 90 tablet, Rfl: 3   Diclofenac Sodium 3 % GEL, Apply topically., Disp: , Rfl:    DULoxetine (CYMBALTA) 30 MG capsule, Take 30 mg by mouth daily., Disp: , Rfl:    ENTRESTO 49-51 MG, TAKE ONE TABLET BY MOUTH TWICE DAILY, Disp: 60 tablet, Rfl: 5   ferrous sulfate 325 (65 FE) MG EC tablet, Take 1 tablet by mouth daily., Disp: , Rfl:    furosemide (LASIX) 20 MG tablet, Take 1 tablet (20 mg total) by mouth  every morning., Disp: 90 tablet, Rfl: 2   gabapentin (NEURONTIN) 600 MG tablet, Take 1 capsule by mouth 3 (three) times daily. , Disp: , Rfl:    hydrOXYzine (ATARAX/VISTARIL) 25 MG tablet, Take 25 mg by mouth 2 (two) times daily as needed., Disp: , Rfl:    metoprolol succinate (TOPROL-XL) 25 MG 24 hr tablet, Take 1 tablet (25 mg total) by mouth daily., Disp: 90 tablet, Rfl: 2   naloxone (NARCAN) nasal spray 4 mg/0.1 mL, SMARTSIG:1 Spray(s) Both Nares 1 to 2 Times Daily, Disp: , Rfl:    nitrofurantoin (MACRODANTIN) 100 MG capsule, Take 100 mg by mouth 2 (two) times daily., Disp: , Rfl:    nystatin  (MYCOSTATIN/NYSTOP) powder, Apply topically 2 (two) times daily., Disp: , Rfl:    omeprazole (PRILOSEC) 40 MG capsule, Take 1 capsule by mouth daily., Disp: , Rfl:    oxyCODONE-acetaminophen (PERCOCET) 7.5-325 MG tablet, Take 1 tablet by mouth 4 (four) times daily as needed., Disp: , Rfl:    PROAIR HFA 108 (90 Base) MCG/ACT inhaler, SMARTSIG:1 Puff(s) By Mouth Every 4-6 Hours PRN, Disp: , Rfl:    QUEtiapine (SEROQUEL) 400 MG tablet, Take 400 mg by mouth at bedtime., Disp: , Rfl:    SUTAB [phone] MG TABS, TAKE 24 TABLETS BY MOUTH PER PREP TEST, Disp: , Rfl:    tiZANidine (ZANAFLEX) 4 MG tablet, Take 4 mg by mouth 3 (three) times daily as needed., Disp: , Rfl:    Vitamin D, Ergocalciferol, (DRISDOL) 1.25 MG (50000 UNIT) CAPS capsule, Take 50,000 Units by mouth once a week., Disp: , Rfl:    warfarin (COUMADIN) 3 MG tablet, Take 2 tablets (6 mg total) by mouth daily at 4 PM. Or as directed by the coumadin clinic (Patient taking differently: Take 6 mg by mouth daily at 4 PM. Or as directed by the coumadin clinic  Currently on: 6 mg every Mon; 4.5 mg all other days), Disp: 180 tablet, Rfl: 2 Past Medical History:  Diagnosis Date   Asthma    Cardiomyopathy (HCC)    CHF (congestive heart failure) (HCC)    Chronic kidney disease    COPD (chronic obstructive pulmonary disease) (HCC)    Coronary artery disease    H/O heart valve replacement with mechanical valve    Heart attack (HCC)    Hyperlipidemia    Hypertension    Paroxysmal atrial fibrillation (HCC)    Stroke [facility])     ASSESSMENT  Recent Results: The most recent result is correlated with 31.5 mg per week:  Lab Results  Component Value Date   INR 3.8 (A) 10/13/2020   INR 5.3 (A) 10/06/2020   INR 4.5 (A) 09/22/2020    Anticoagulation Dosing: Description   INR above goal. Decrease weekly dose to 3 mg every Tue, Thu, Sat; 4.5 mg all other days. Recheck INR in 1 week.      INR today: Supratherapeutic. Trending down after recent  dose adjustment. INR last week of 19.5 mg. No noted complains of bleeding or bruising symptoms. No other relevant changes in diet, medications, or lifestyles. Due to pt's high thromboembolic risk, would need lovenox bridging. Will review hold and bridge direction with pt in detail at next INR check.   PLAN Weekly dose was decreased by 14.3% to 27 mg/week. Decrease weekly dose to 3 mg every Tues, Thurs, Sat and 4.5 mg all other days. Recheck INR in 1 week.   Patient Instructions  INR above goal. Decrease weekly dose to 3 mg every  Tue, Thu, Sat; 4.5 mg all other days. Recheck INR in 1 week.  Patient advised to contact clinic or seek medical attention if signs/symptoms of bleeding or thromboembolism occur.  Patient verbalized understanding by repeating back information and was advised to contact me if further medication-related questions arise.   Follow-up Return in about 8 days (around 10/21/2020).  Leonides Schanz, PharmD  15 minutes spent face-to-face with the patient during the encounter. 50% of time spent on education, including signs/sx bleeding and clotting, as well as food and drug interactions with warfarin. 50% of time was spent on fingerprick POC INR sample collection,processing, results determination, and documentation

## 2020-10-21 ENCOUNTER — Other Ambulatory Visit: Payer: Self-pay

## 2020-10-21 ENCOUNTER — Other Ambulatory Visit: Payer: Self-pay | Admitting: Pharmacist

## 2020-10-21 ENCOUNTER — Ambulatory Visit: Payer: Medicaid Other | Admitting: Pharmacist

## 2020-10-21 DIAGNOSIS — Z952 Presence of prosthetic heart valve: Secondary | ICD-10-CM

## 2020-10-21 DIAGNOSIS — Z7901 Long term (current) use of anticoagulants: Secondary | ICD-10-CM

## 2020-10-21 DIAGNOSIS — Z5181 Encounter for therapeutic drug level monitoring: Secondary | ICD-10-CM

## 2020-10-21 DIAGNOSIS — I480 Paroxysmal atrial fibrillation: Secondary | ICD-10-CM

## 2020-10-21 LAB — POCT INR: INR: 2.6 (ref 2.0–3.0)

## 2020-10-21 MED ORDER — ENOXAPARIN SODIUM 100 MG/ML IJ SOSY: 1.0000 mg/kg | PREFILLED_SYRINGE | Freq: Two times a day (BID) | INTRAMUSCULAR | 1 refills | Status: DC

## 2020-10-21 NOTE — Progress Notes (Signed)
Anticoagulation Management Joanna Martinez is a 60 y.o. female who reports to the clinic for monitoring of warfarin treatment.    Indication: atrial fibrillation and s/p Saint Jude mechanical mitral valve replacement in March 2002  ; CHA2DS2 Vasc Score 6 (Female, CHF hx, HTN hx, Stroke/TIA hx, valvular heart disease), HAS-BLED 1 (Stroke hx)   Duration: indefinite Supervising physician: Tessa Lerner  Anticoagulation Clinic Visit History:  Hx of multiple previous strokes. Most recent incidence 3 or 4 years ago. Pt also on Plavix for history of coronary artery disease status post drug-eluting stent in August 2019. Noteded major DDI b/w warfarin and plavix of increased bleeding risk. Noted DDI b/w warfarin and allopurinol about increased anticoagulant effect as well. Pt denies any recent allopurinol dose changes.Pt confirmed to have a tan colored (3 mg) warfarin tablet at home. Pt started taking warfarin in the evening as previously discussed.     Patient does not report signs/symptoms of bleeding or thromboembolism.   Other recent changes: No changes in medications or lifestyle.   Pt reports to having recurrent episodes of hematuria and associated possible UTI infections. Pt seen by PCP and wasted started on another course of macrobid for 7 day course. Expected end date of 8/24.   Pt also scheduled for follow up endoscopy on 8/25.    Pt reports that she is having worsening memory concerns and is going to follow up with her PCP. Pt to discuss with PCP about her concerns of repeat colonoscopy as well.   Anticoagulation Episode Summary     Current INR goal:  2.5-3.5  TTR:  52.7 % (1.3 y)  Next INR check:  11/03/2020  INR from last check:  2.6 (10/21/2020)  Weekly max warfarin dose:    Target end date:  Indefinite  INR check location:    Preferred lab:    Send INR reminders to:     Indications   S/P mitral valve replacement [Z95.2] Paroxysmal atrial fibrillation (HCC) [I48.0] Monitoring for  long-term anticoagulant use [Z51.81 Z79.01]        Comments:           No Known Allergies  Current Outpatient Medications:    allopurinol (ZYLOPRIM) 100 MG tablet, Take 1 tablet by mouth daily. , Disp: , Rfl:    ALPRAZolam (XANAX) 0.5 MG tablet, Take 0.5 mg by mouth 2 (two) times daily as needed., Disp: , Rfl:    amoxicillin (AMOXIL) 500 MG tablet, Take 2,000 mg by mouth once. Prior to dental appt, Disp: , Rfl:    atorvastatin (LIPITOR) 80 MG tablet, Take 1 tablet (80 mg total) by mouth at bedtime., Disp: 90 tablet, Rfl: 1   busPIRone (BUSPAR) 10 MG tablet, Take 1 tablet by mouth in the morning and at bedtime., Disp: , Rfl:    clopidogrel (PLAVIX) 75 MG tablet, Take 1 tablet (75 mg total) by mouth daily. (Patient not taking: No sig reported), Disp: 90 tablet, Rfl: 2   dapagliflozin propanediol (FARXIGA) 10 MG TABS tablet, Take 1 tablet (10 mg total) by mouth daily before breakfast., Disp: 90 tablet, Rfl: 3   Diclofenac Sodium 3 % GEL, Apply topically., Disp: , Rfl:    DULoxetine (CYMBALTA) 30 MG capsule, Take 30 mg by mouth daily., Disp: , Rfl:    enoxaparin (LOVENOX) 100 MG/ML injection, Inject 0.9 mLs (90 mg total) into the skin every 12 (twelve) hours. FOLLOW BRIDGE DIRECTIONS AS INSTRUCTED. DISCARD 0.1 mL PRIOR TO USE FOR EACH INJECTION, Disp: 18 mL, Rfl: 1  ENTRESTO 49-51 MG, TAKE ONE TABLET BY MOUTH TWICE DAILY, Disp: 60 tablet, Rfl: 5   ferrous sulfate 325 (65 FE) MG EC tablet, Take 1 tablet by mouth daily., Disp: , Rfl:    furosemide (LASIX) 20 MG tablet, Take 1 tablet (20 mg total) by mouth every morning., Disp: 90 tablet, Rfl: 2   gabapentin (NEURONTIN) 600 MG tablet, Take 1 capsule by mouth 3 (three) times daily. , Disp: , Rfl:    hydrOXYzine (ATARAX/VISTARIL) 25 MG tablet, Take 25 mg by mouth 2 (two) times daily as needed., Disp: , Rfl:    metoprolol succinate (TOPROL-XL) 25 MG 24 hr tablet, Take 1 tablet (25 mg total) by mouth daily., Disp: 90 tablet, Rfl: 2   naloxone  (NARCAN) nasal spray 4 mg/0.1 mL, SMARTSIG:1 Spray(s) Both Nares 1 to 2 Times Daily, Disp: , Rfl:    nitrofurantoin, macrocrystal-monohydrate, (MACROBID) 100 MG capsule, Take 100 mg by mouth 2 (two) times daily., Disp: , Rfl:    nystatin (MYCOSTATIN/NYSTOP) powder, Apply topically 2 (two) times daily., Disp: , Rfl:    omeprazole (PRILOSEC) 40 MG capsule, Take 1 capsule by mouth daily., Disp: , Rfl:    oxyCODONE-acetaminophen (PERCOCET) 7.5-325 MG tablet, Take 1 tablet by mouth 4 (four) times daily as needed., Disp: , Rfl:    PROAIR HFA 108 (90 Base) MCG/ACT inhaler, SMARTSIG:1 Puff(s) By Mouth Every 4-6 Hours PRN, Disp: , Rfl:    QUEtiapine (SEROQUEL) 400 MG tablet, Take 400 mg by mouth at bedtime., Disp: , Rfl:    SUTAB [phone] MG TABS, TAKE 24 TABLETS BY MOUTH PER PREP TEST, Disp: , Rfl:    tiZANidine (ZANAFLEX) 4 MG tablet, Take 4 mg by mouth 3 (three) times daily as needed., Disp: , Rfl:    Vitamin D, Ergocalciferol, (DRISDOL) 1.25 MG (50000 UNIT) CAPS capsule, Take 50,000 Units by mouth once a week., Disp: , Rfl:    warfarin (COUMADIN) 3 MG tablet, Take 2 tablets (6 mg total) by mouth daily at 4 PM. Or as directed by the coumadin clinic (Patient taking differently: Take 6 mg by mouth daily at 4 PM. Or as directed by the coumadin clinic  Currently on: 6 mg every Mon; 4.5 mg all other days), Disp: 180 tablet, Rfl: 2 Past Medical History:  Diagnosis Date   Asthma    Cardiomyopathy (HCC)    CHF (congestive heart failure) (HCC)    Chronic kidney disease    COPD (chronic obstructive pulmonary disease) (HCC)    Coronary artery disease    H/O heart valve replacement with mechanical valve    Heart attack (HCC)    Hyperlipidemia    Hypertension    Paroxysmal atrial fibrillation (HCC)    Stroke [facility])     ASSESSMENT  Recent Results: The most recent result is correlated with 27 mg per week:  Lab Results  Component Value Date   INR 2.6 10/21/2020   INR 3.8 (A) 10/13/2020   INR 5.3 (A)  10/06/2020    Anticoagulation Dosing: Description   INR at goal. HOLD warfarin 8/20-8/24. Follow bridge instructions as provided. Continue current weekly dose of 3 mg every Tue, Thu, Sat; 4.5 mg all other days. Recheck INR in 2 week.      INR today: Therapeutic. Returns within therapeutic range following recent weekly dose decrease. Recurrent concerns of hematuria bring followed by PCP. Denies any recurrence of melena complains but reviewed with pt in detail about the benefits of possible repeat endoscopy to rule out any active bleeds.  Provided with with two sets of bridge directions for pt to review and reference through the hold period. Called pt's pharmacy and requested pharmacist to waste 0.1 mL as pt has poor visual acuity. Reviewed with pt in detail about proper lovenox injection techniques and being able to follow the written directions of lovenox dosage. Called pt's PCP office to confirm the endoscopy date and the need for holding warfarin. Will recheck INR reading 1 week post op.   PLAN Weekly dose was unchanged by 0% to 27 mg/week. HOLD warfarin 8/20-8/24. Follow bidge instructions as directed. Restart home weekly dose of 3 mg every Tues, Thurs, Sat and 4.5 mg all other days. Recheck INR in 2 week.   Patient Instructions  INR at goal. HOLD warfarin 8/20-8/24. Follow bridge instructions as provided. Continue current weekly dose of 3 mg every Tue, Thu, Sat; 4.5 mg all other days. Recheck INR in 2 week.  Patient advised to contact clinic or seek medical attention if signs/symptoms of bleeding or thromboembolism occur.  Patient verbalized understanding by repeating back information and was advised to contact me if further medication-related questions arise.   Follow-up Return in about 13 days (around 11/03/2020).  Leonides Schanz, PharmD  15 minutes spent face-to-face with the patient during the encounter. 50% of time spent on education, including signs/sx bleeding and clotting, as well as  food and drug interactions with warfarin. 50% of time was spent on fingerprick POC INR sample collection,processing, results determination, and documentation

## 2020-10-21 NOTE — Progress Notes (Signed)
Done.  Please follow up once her endoscopy is over to transition her back to coumadin.

## 2020-10-22 NOTE — Patient Instructions (Signed)
INR at goal. HOLD warfarin 8/20-8/24. Follow bridge instructions as provided. Continue current weekly dose of 3 mg every Tue, Thu, Sat; 4.5 mg all other days. Recheck INR in 2 week.

## 2020-11-03 ENCOUNTER — Ambulatory Visit: Payer: Medicaid Other | Admitting: Pharmacist

## 2020-11-03 ENCOUNTER — Other Ambulatory Visit: Payer: Self-pay

## 2020-11-03 DIAGNOSIS — Z5181 Encounter for therapeutic drug level monitoring: Secondary | ICD-10-CM

## 2020-11-03 DIAGNOSIS — I480 Paroxysmal atrial fibrillation: Secondary | ICD-10-CM

## 2020-11-03 DIAGNOSIS — Z952 Presence of prosthetic heart valve: Secondary | ICD-10-CM

## 2020-11-03 DIAGNOSIS — Z7901 Long term (current) use of anticoagulants: Secondary | ICD-10-CM

## 2020-11-03 LAB — POCT INR: INR: 2.4 (ref 2.0–3.0)

## 2020-11-03 NOTE — Progress Notes (Signed)
Anticoagulation Management Joanna Martinez is a 60 y.o. female who reports to the clinic for monitoring of warfarin treatment.    Indication: atrial fibrillation and s/p Saint Jude mechanical mitral valve replacement in March 2002  ; CHA2DS2 Vasc Score 6 (Female, CHF hx, HTN hx, Stroke/TIA hx, valvular heart disease), HAS-BLED 1 (Stroke hx)   Duration: indefinite Supervising physician: Tessa Lerner  Anticoagulation Clinic Visit History:  Hx of multiple previous strokes. Most recent incidence 3 or 4 years ago. Pt also on Plavix for history of coronary artery disease status post drug-eluting stent in August 2019. Noteded major DDI b/w warfarin and plavix of increased bleeding risk. Noted DDI b/w warfarin and allopurinol about increased anticoagulant effect as well. Pt denies any recent allopurinol dose changes.Pt confirmed to have a tan colored (3 mg) warfarin tablet at home. Pt started taking warfarin in the evening as previously discussed.     Patient does not report signs/symptoms of bleeding or thromboembolism.   Other recent changes: No changes in medications or lifestyle.   Pt s/p endoscopy on 10/27/20. Pt reports that she hasnt been told about the result yet. Not able to be seen on care-everywhere. Pt reports that she was told to start back on her warfarin the same day. Reports no post-op complications. Pt continues to use her lovenox bridge as directed. Pt verbalized compliance to her bridge directions.   Pt reports that she again noticed episodes of dark stool over the past 3 days. Pt noted to be taking oral iron supplements. Denies any abdominal pain, hematochezia, fatigue. Hasnt had CBC checked since.    Anticoagulation Episode Summary     Current INR goal:  2.5-3.5  TTR:  52.7 % (1.3 y)  Next INR check:  11/17/2020  INR from last check:  2.4 (11/03/2020)  Weekly max warfarin dose:    Target end date:  Indefinite  INR check location:    Preferred lab:    Send INR reminders to:      Indications   S/P mitral valve replacement [Z95.2] Paroxysmal atrial fibrillation (HCC) [I48.0] Monitoring for long-term anticoagulant use [Z51.81 Z79.01]        Comments:           No Known Allergies  Current Outpatient Medications:    allopurinol (ZYLOPRIM) 100 MG tablet, Take 1 tablet by mouth daily. , Disp: , Rfl:    ALPRAZolam (XANAX) 0.5 MG tablet, Take 0.5 mg by mouth 2 (two) times daily as needed., Disp: , Rfl:    amoxicillin (AMOXIL) 500 MG tablet, Take 2,000 mg by mouth once. Prior to dental appt, Disp: , Rfl:    atorvastatin (LIPITOR) 80 MG tablet, Take 1 tablet (80 mg total) by mouth at bedtime., Disp: 90 tablet, Rfl: 1   busPIRone (BUSPAR) 10 MG tablet, Take 1 tablet by mouth in the morning and at bedtime., Disp: , Rfl:    clopidogrel (PLAVIX) 75 MG tablet, Take 1 tablet (75 mg total) by mouth daily. (Patient not taking: No sig reported), Disp: 90 tablet, Rfl: 2   dapagliflozin propanediol (FARXIGA) 10 MG TABS tablet, Take 1 tablet (10 mg total) by mouth daily before breakfast., Disp: 90 tablet, Rfl: 3   Diclofenac Sodium 3 % GEL, Apply topically., Disp: , Rfl:    DULoxetine (CYMBALTA) 30 MG capsule, Take 30 mg by mouth daily., Disp: , Rfl:    enoxaparin (LOVENOX) 100 MG/ML injection, Inject 0.9 mLs (90 mg total) into the skin every 12 (twelve) hours. FOLLOW BRIDGE DIRECTIONS AS INSTRUCTED.  DISCARD 0.1 mL PRIOR TO USE FOR EACH INJECTION, Disp: 18 mL, Rfl: 1   ENTRESTO 49-51 MG, TAKE ONE TABLET BY MOUTH TWICE DAILY, Disp: 60 tablet, Rfl: 5   ferrous sulfate 325 (65 FE) MG EC tablet, Take 1 tablet by mouth daily., Disp: , Rfl:    furosemide (LASIX) 20 MG tablet, Take 1 tablet (20 mg total) by mouth every morning., Disp: 90 tablet, Rfl: 2   gabapentin (NEURONTIN) 600 MG tablet, Take 1 capsule by mouth 3 (three) times daily. , Disp: , Rfl:    hydrOXYzine (ATARAX/VISTARIL) 25 MG tablet, Take 25 mg by mouth 2 (two) times daily as needed., Disp: , Rfl:    metoprolol succinate  (TOPROL-XL) 25 MG 24 hr tablet, Take 1 tablet (25 mg total) by mouth daily., Disp: 90 tablet, Rfl: 2   naloxone (NARCAN) nasal spray 4 mg/0.1 mL, SMARTSIG:1 Spray(s) Both Nares 1 to 2 Times Daily, Disp: , Rfl:    nitrofurantoin, macrocrystal-monohydrate, (MACROBID) 100 MG capsule, Take 100 mg by mouth 2 (two) times daily., Disp: , Rfl:    nystatin (MYCOSTATIN/NYSTOP) powder, Apply topically 2 (two) times daily., Disp: , Rfl:    omeprazole (PRILOSEC) 40 MG capsule, Take 1 capsule by mouth daily., Disp: , Rfl:    oxyCODONE-acetaminophen (PERCOCET) 7.5-325 MG tablet, Take 1 tablet by mouth 4 (four) times daily as needed., Disp: , Rfl:    PROAIR HFA 108 (90 Base) MCG/ACT inhaler, SMARTSIG:1 Puff(s) By Mouth Every 4-6 Hours PRN, Disp: , Rfl:    QUEtiapine (SEROQUEL) 400 MG tablet, Take 400 mg by mouth at bedtime., Disp: , Rfl:    SUTAB [id number] MG TABS, TAKE 24 TABLETS BY MOUTH PER PREP TEST, Disp: , Rfl:    tiZANidine (ZANAFLEX) 4 MG tablet, Take 4 mg by mouth 3 (three) times daily as needed., Disp: , Rfl:    Vitamin D, Ergocalciferol, (DRISDOL) 1.25 MG (50000 UNIT) CAPS capsule, Take 50,000 Units by mouth once a week., Disp: , Rfl:    warfarin (COUMADIN) 3 MG tablet, Take 2 tablets (6 mg total) by mouth daily at 4 PM. Or as directed by the coumadin clinic (Patient taking differently: Take 6 mg by mouth daily at 4 PM. Or as directed by the coumadin clinic  Currently on: 6 mg every Mon; 4.5 mg all other days), Disp: 180 tablet, Rfl: 2 Past Medical History:  Diagnosis Date   Asthma    Cardiomyopathy (HCC)    CHF (congestive heart failure) (HCC)    Chronic kidney disease    COPD (chronic obstructive pulmonary disease) (HCC)    Coronary artery disease    H/O heart valve replacement with mechanical valve    Heart attack (HCC)    Hyperlipidemia    Hypertension    Paroxysmal atrial fibrillation (HCC)    Stroke [facility])     ASSESSMENT  Recent Results: The most recent result is correlated with  28.5 mg per week:  Lab Results  Component Value Date   INR 2.4 11/03/2020   INR 2.6 10/21/2020   INR 3.8 (A) 10/13/2020    Anticoagulation Dosing: Description   INR slightly below goal. Continue current weekly dose of 3 mg every Tue, Thu, Sat; 4.5 mg all other days. Recheck INR in 2 week.      INR today: Subtherapeutic. 1 week post-op. Pt restarted on previously therapeutic weekly dose. INR slightly below goal since restarting on previously stable weekly dose. Denies any post-op bleeding or bruising. Concerns for melena noted. Encouraged pt to  follow-up with her PCP and GI provider. Possible dark tool in setting of iron supplements. Denies any other complains of bleeding or bruising symptoms. Denies any other relevant changes in diet, medications, or lifestyle changes. Since INR >2, will have pt stop Lovenox injections. Will have pt continue current weekly dose and continue closely monitoring. Will follow-up and recheck INR in 2 weeks.   PLAN Weekly dose was unchanged by 0% to 27 mg/week. Stop Lovenox injection. Continue current weekly dose of 3 mg every Tues, Thurs, Sat and 4.5 mg all other days. Recheck INR in 2 week.   Patient Instructions  INR slightly below goal. Continue current weekly dose of 3 mg every Tue, Thu, Sat; 4.5 mg all other days. Recheck INR in 2 week.  Patient advised to contact clinic or seek medical attention if signs/symptoms of bleeding or thromboembolism occur.  Patient verbalized understanding by repeating back information and was advised to contact me if further medication-related questions arise.   Follow-up Return in about 2 weeks (around 11/17/2020).  Leonides Schanz, PharmD  15 minutes spent face-to-face with the patient during the encounter. 50% of time spent on education, including signs/sx bleeding and clotting, as well as food and drug interactions with warfarin. 50% of time was spent on fingerprick POC INR sample collection,processing, results  determination, and documentation

## 2020-11-03 NOTE — Patient Instructions (Signed)
INR slightly below goal. Continue current weekly dose of 3 mg every Tue, Thu, Sat; 4.5 mg all other days. Recheck INR in 2 week.

## 2020-11-16 ENCOUNTER — Other Ambulatory Visit: Payer: Self-pay

## 2020-11-16 ENCOUNTER — Ambulatory Visit: Payer: Medicaid Other | Admitting: Pharmacist

## 2020-11-16 DIAGNOSIS — I480 Paroxysmal atrial fibrillation: Secondary | ICD-10-CM

## 2020-11-16 DIAGNOSIS — Z7901 Long term (current) use of anticoagulants: Secondary | ICD-10-CM

## 2020-11-16 DIAGNOSIS — Z5181 Encounter for therapeutic drug level monitoring: Secondary | ICD-10-CM

## 2020-11-16 DIAGNOSIS — Z952 Presence of prosthetic heart valve: Secondary | ICD-10-CM

## 2020-11-16 LAB — POCT INR: INR: 2.9 (ref 2.0–3.0)

## 2020-11-16 NOTE — Progress Notes (Signed)
Anticoagulation Management Joanna Martinez is a 60 y.o. female who reports to the clinic for monitoring of warfarin treatment.    Indication: atrial fibrillation and s/p Saint Jude mechanical mitral valve replacement in March 2002  ; CHA2DS2 Vasc Score 6 (Female, CHF hx, HTN hx, Stroke/TIA hx, valvular heart disease), HAS-BLED 1 (Stroke hx)   Duration: indefinite Supervising physician: Tessa Lerner  Anticoagulation Clinic Visit History:  Hx of multiple previous strokes. Most recent incidence 3 or 4 years ago. Pt also on Plavix for history of coronary artery disease status post drug-eluting stent in August 2019. Noteded major DDI b/w warfarin and plavix of increased bleeding risk. Noted DDI b/w warfarin and allopurinol about increased anticoagulant effect as well. Pt denies any recent allopurinol dose changes.Pt confirmed to have a tan colored (3 mg) warfarin tablet at home. Pt started taking warfarin in the evening as previously discussed.     Patient does not report signs/symptoms of bleeding or thromboembolism.   Other recent changes: No changes in medications or lifestyle.  Pt brought her EGD report from 10/27/20 to the office today  Noted to have esophageal and gastric biopsy collected. No noted evidence of active bleeding. Waiting for pathology report. Pt has a f/u GI OV scheduled for 01/01/21. Pt reports that concern of melena has resolved since.   Anticoagulation Episode Summary     Current INR goal:  2.5-3.5  TTR:  53.4 % (1.3 y)  Next INR check:  12/07/2020  INR from last check:  2.9 (11/16/2020)  Weekly max warfarin dose:    Target end date:  Indefinite  INR check location:    Preferred lab:    Send INR reminders to:     Indications   S/P mitral valve replacement [Z95.2] Paroxysmal atrial fibrillation (HCC) [I48.0] Monitoring for long-term anticoagulant use [Z51.81 Z79.01]        Comments:           No Known Allergies  Current Outpatient Medications:    allopurinol  (ZYLOPRIM) 100 MG tablet, Take 1 tablet by mouth daily. , Disp: , Rfl:    ALPRAZolam (XANAX) 0.5 MG tablet, Take 0.5 mg by mouth 2 (two) times daily as needed., Disp: , Rfl:    amoxicillin (AMOXIL) 500 MG tablet, Take 2,000 mg by mouth once. Prior to dental appt, Disp: , Rfl:    atorvastatin (LIPITOR) 80 MG tablet, Take 1 tablet (80 mg total) by mouth at bedtime., Disp: 90 tablet, Rfl: 1   busPIRone (BUSPAR) 10 MG tablet, Take 1 tablet by mouth in the morning and at bedtime., Disp: , Rfl:    clopidogrel (PLAVIX) 75 MG tablet, Take 1 tablet (75 mg total) by mouth daily. (Patient not taking: No sig reported), Disp: 90 tablet, Rfl: 2   dapagliflozin propanediol (FARXIGA) 10 MG TABS tablet, Take 1 tablet (10 mg total) by mouth daily before breakfast., Disp: 90 tablet, Rfl: 3   Diclofenac Sodium 3 % GEL, Apply topically., Disp: , Rfl:    DULoxetine (CYMBALTA) 30 MG capsule, Take 30 mg by mouth daily., Disp: , Rfl:    enoxaparin (LOVENOX) 100 MG/ML injection, Inject 0.9 mLs (90 mg total) into the skin every 12 (twelve) hours. FOLLOW BRIDGE DIRECTIONS AS INSTRUCTED. DISCARD 0.1 mL PRIOR TO USE FOR EACH INJECTION, Disp: 18 mL, Rfl: 1   ENTRESTO 49-51 MG, TAKE ONE TABLET BY MOUTH TWICE DAILY, Disp: 60 tablet, Rfl: 5   ferrous sulfate 325 (65 FE) MG EC tablet, Take 1 tablet by mouth daily., Disp: ,  Rfl:    furosemide (LASIX) 20 MG tablet, Take 1 tablet (20 mg total) by mouth every morning., Disp: 90 tablet, Rfl: 2   gabapentin (NEURONTIN) 600 MG tablet, Take 1 capsule by mouth 3 (three) times daily. , Disp: , Rfl:    hydrOXYzine (ATARAX/VISTARIL) 25 MG tablet, Take 25 mg by mouth 2 (two) times daily as needed., Disp: , Rfl:    metoprolol succinate (TOPROL-XL) 25 MG 24 hr tablet, Take 1 tablet (25 mg total) by mouth daily., Disp: 90 tablet, Rfl: 2   naloxone (NARCAN) nasal spray 4 mg/0.1 mL, SMARTSIG:1 Spray(s) Both Nares 1 to 2 Times Daily, Disp: , Rfl:    nitrofurantoin, macrocrystal-monohydrate, (MACROBID)  100 MG capsule, Take 100 mg by mouth 2 (two) times daily., Disp: , Rfl:    nystatin (MYCOSTATIN/NYSTOP) powder, Apply topically 2 (two) times daily., Disp: , Rfl:    omeprazole (PRILOSEC) 40 MG capsule, Take 1 capsule by mouth daily., Disp: , Rfl:    oxyCODONE-acetaminophen (PERCOCET) 7.5-325 MG tablet, Take 1 tablet by mouth 4 (four) times daily as needed., Disp: , Rfl:    PROAIR HFA 108 (90 Base) MCG/ACT inhaler, SMARTSIG:1 Puff(s) By Mouth Every 4-6 Hours PRN, Disp: , Rfl:    QUEtiapine (SEROQUEL) 400 MG tablet, Take 400 mg by mouth at bedtime., Disp: , Rfl:    SUTAB [phone] MG TABS, TAKE 24 TABLETS BY MOUTH PER PREP TEST, Disp: , Rfl:    tiZANidine (ZANAFLEX) 4 MG tablet, Take 4 mg by mouth 3 (three) times daily as needed., Disp: , Rfl:    Vitamin D, Ergocalciferol, (DRISDOL) 1.25 MG (50000 UNIT) CAPS capsule, Take 50,000 Units by mouth once a week., Disp: , Rfl:    warfarin (COUMADIN) 3 MG tablet, Take 2 tablets (6 mg total) by mouth daily at 4 PM. Or as directed by the coumadin clinic (Patient taking differently: Take 6 mg by mouth daily at 4 PM. Or as directed by the coumadin clinic  Currently on: 6 mg every Mon; 4.5 mg all other days), Disp: 180 tablet, Rfl: 2 Past Medical History:  Diagnosis Date   Asthma    Cardiomyopathy (HCC)    CHF (congestive heart failure) (HCC)    Chronic kidney disease    COPD (chronic obstructive pulmonary disease) (HCC)    Coronary artery disease    H/O heart valve replacement with mechanical valve    Heart attack (HCC)    Hyperlipidemia    Hypertension    Paroxysmal atrial fibrillation (HCC)    Stroke [facility])     ASSESSMENT  Recent Results: The most recent result is correlated with 27 mg per week:  Lab Results  Component Value Date   INR 2.9 11/16/2020   INR 2.4 11/03/2020   INR 2.6 10/21/2020    Anticoagulation Dosing: Description   INR at goal. Continue current weekly dose of 3 mg every Tue, Thu, Sat; 4.5 mg all other days. Recheck INR  in 3 weeks.      INR today: Therapeutic. Returns to being therapeutic following returning to previous weekly dose. Denies any other complains of bleeding or bruising symptoms. Denies any other relevant changes in diet, medications, or lifestyle changes. Will continue current weekly dose and continue close monitoring and follow-up  PLAN Weekly dose was unchanged by 0% to 27 mg/week. Continue current weekly dose of 3 mg every Tues, Thurs, Sat and 4.5 mg all other days. Recheck INR in 3 week.   Patient Instructions  INR at goal. Continue current weekly  dose of 3 mg every Tue, Thu, Sat; 4.5 mg all other days. Recheck INR in 3 weeks.  Patient advised to contact clinic or seek medical attention if signs/symptoms of bleeding or thromboembolism occur.  Patient verbalized understanding by repeating back information and was advised to contact me if further medication-related questions arise.   Follow-up Return in about 3 weeks (around 12/07/2020).  Leonides Schanz, PharmD  15 minutes spent face-to-face with the patient during the encounter. 50% of time spent on education, including signs/sx bleeding and clotting, as well as food and drug interactions with warfarin. 50% of time was spent on fingerprick POC INR sample collection,processing, results determination, and documentation

## 2020-11-16 NOTE — Patient Instructions (Signed)
INR at goal. Continue current weekly dose of 3 mg every Tue, Thu, Sat; 4.5 mg all other days. Recheck INR in 3 weeks.

## 2020-12-07 ENCOUNTER — Ambulatory Visit: Payer: Medicaid Other | Admitting: Pharmacist

## 2020-12-07 ENCOUNTER — Other Ambulatory Visit: Payer: Self-pay

## 2020-12-07 DIAGNOSIS — Z952 Presence of prosthetic heart valve: Secondary | ICD-10-CM

## 2020-12-07 DIAGNOSIS — Z7901 Long term (current) use of anticoagulants: Secondary | ICD-10-CM

## 2020-12-07 DIAGNOSIS — Z5181 Encounter for therapeutic drug level monitoring: Secondary | ICD-10-CM

## 2020-12-07 DIAGNOSIS — I480 Paroxysmal atrial fibrillation: Secondary | ICD-10-CM

## 2020-12-07 LAB — POCT INR: INR: 4 — AB (ref 2.0–3.0)

## 2020-12-07 NOTE — Progress Notes (Signed)
Anticoagulation Management Joanna Martinez is a 60 y.o. female who reports to the clinic for monitoring of warfarin treatment.    Indication: atrial fibrillation and s/p Saint Jude mechanical mitral valve replacement in March 2002  ; CHA2DS2 Vasc Score 6 (Female, CHF hx, HTN hx, Stroke/TIA hx, valvular heart disease), HAS-BLED 1 (Stroke hx)   Duration: indefinite Supervising physician: Tessa Lerner  Anticoagulation Clinic Visit History:  Hx of multiple previous strokes. Most recent incidence 3 or 4 years ago. Pt also on Plavix for history of coronary artery disease status post drug-eluting stent in August 2019. Noteded major DDI b/w warfarin and plavix of increased bleeding risk. Noted DDI b/w warfarin and allopurinol about increased anticoagulant effect as well. Pt denies any recent allopurinol dose changes.Pt confirmed to have a tan colored (3 mg) warfarin tablet at home. Pt started taking warfarin in the evening as previously discussed.     Patient does not report signs/symptoms of bleeding or thromboembolism.   Other recent changes: No changes in medications or lifestyle.  EGD report from 10/27/20.  Noted to have esophageal and gastric biopsy collected. No noted evidence of active bleeding. Waiting for pathology report. Pt has a f/u GI OV scheduled for 01/01/21. Pt reports that concern of melena has resolved since.   Pt reports that she had decreased appetite over the past few weeks. Hasnt had her baseline Vit K servings of 3-4 servings/weeks.   Anticoagulation Episode Summary     Current INR goal:  2.5-3.5  TTR:  53.4 % (1.4 y)  Next INR check:  12/28/2020  INR from last check:  4.0 (12/07/2020)  Weekly max warfarin dose:    Target end date:  Indefinite  INR check location:    Preferred lab:    Send INR reminders to:     Indications   S/P mitral valve replacement [Z95.2] Paroxysmal atrial fibrillation (HCC) [I48.0] Monitoring for long-term anticoagulant use [Z51.81 Z79.01]         Comments:           No Known Allergies  Current Outpatient Medications:    allopurinol (ZYLOPRIM) 100 MG tablet, Take 1 tablet by mouth daily. , Disp: , Rfl:    ALPRAZolam (XANAX) 0.5 MG tablet, Take 0.5 mg by mouth 2 (two) times daily as needed., Disp: , Rfl:    amoxicillin (AMOXIL) 500 MG tablet, Take 2,000 mg by mouth once. Prior to dental appt, Disp: , Rfl:    atorvastatin (LIPITOR) 80 MG tablet, Take 1 tablet (80 mg total) by mouth at bedtime., Disp: 90 tablet, Rfl: 1   busPIRone (BUSPAR) 10 MG tablet, Take 1 tablet by mouth in the morning and at bedtime., Disp: , Rfl:    clopidogrel (PLAVIX) 75 MG tablet, Take 1 tablet (75 mg total) by mouth daily. (Patient not taking: No sig reported), Disp: 90 tablet, Rfl: 2   dapagliflozin propanediol (FARXIGA) 10 MG TABS tablet, Take 1 tablet (10 mg total) by mouth daily before breakfast., Disp: 90 tablet, Rfl: 3   Diclofenac Sodium 3 % GEL, Apply topically., Disp: , Rfl:    DULoxetine (CYMBALTA) 30 MG capsule, Take 30 mg by mouth daily., Disp: , Rfl:    enoxaparin (LOVENOX) 100 MG/ML injection, Inject 0.9 mLs (90 mg total) into the skin every 12 (twelve) hours. FOLLOW BRIDGE DIRECTIONS AS INSTRUCTED. DISCARD 0.1 mL PRIOR TO USE FOR EACH INJECTION, Disp: 18 mL, Rfl: 1   ENTRESTO 49-51 MG, TAKE ONE TABLET BY MOUTH TWICE DAILY, Disp: 60 tablet, Rfl: 5  ferrous sulfate 325 (65 FE) MG EC tablet, Take 1 tablet by mouth daily., Disp: , Rfl:    furosemide (LASIX) 20 MG tablet, Take 1 tablet (20 mg total) by mouth every morning., Disp: 90 tablet, Rfl: 2   gabapentin (NEURONTIN) 600 MG tablet, Take 1 capsule by mouth 3 (three) times daily. , Disp: , Rfl:    hydrOXYzine (ATARAX/VISTARIL) 25 MG tablet, Take 25 mg by mouth 2 (two) times daily as needed., Disp: , Rfl:    metoprolol succinate (TOPROL-XL) 25 MG 24 hr tablet, Take 1 tablet (25 mg total) by mouth daily., Disp: 90 tablet, Rfl: 2   naloxone (NARCAN) nasal spray 4 mg/0.1 mL, SMARTSIG:1 Spray(s)  Both Nares 1 to 2 Times Daily, Disp: , Rfl:    nitrofurantoin, macrocrystal-monohydrate, (MACROBID) 100 MG capsule, Take 100 mg by mouth 2 (two) times daily., Disp: , Rfl:    nystatin (MYCOSTATIN/NYSTOP) powder, Apply topically 2 (two) times daily., Disp: , Rfl:    omeprazole (PRILOSEC) 40 MG capsule, Take 1 capsule by mouth daily., Disp: , Rfl:    oxyCODONE-acetaminophen (PERCOCET) 7.5-325 MG tablet, Take 1 tablet by mouth 4 (four) times daily as needed., Disp: , Rfl:    PROAIR HFA 108 (90 Base) MCG/ACT inhaler, SMARTSIG:1 Puff(s) By Mouth Every 4-6 Hours PRN, Disp: , Rfl:    QUEtiapine (SEROQUEL) 400 MG tablet, Take 400 mg by mouth at bedtime., Disp: , Rfl:    SUTAB [phone] MG TABS, TAKE 24 TABLETS BY MOUTH PER PREP TEST, Disp: , Rfl:    tiZANidine (ZANAFLEX) 4 MG tablet, Take 4 mg by mouth 3 (three) times daily as needed., Disp: , Rfl:    Vitamin D, Ergocalciferol, (DRISDOL) 1.25 MG (50000 UNIT) CAPS capsule, Take 50,000 Units by mouth once a week., Disp: , Rfl:    warfarin (COUMADIN) 3 MG tablet, Take 2 tablets (6 mg total) by mouth daily at 4 PM. Or as directed by the coumadin clinic (Patient taking differently: Take 6 mg by mouth daily at 4 PM. Or as directed by the coumadin clinic  Currently on: 6 mg every Mon; 4.5 mg all other days), Disp: 180 tablet, Rfl: 2 Past Medical History:  Diagnosis Date   Asthma    Cardiomyopathy (HCC)    CHF (congestive heart failure) (HCC)    Chronic kidney disease    COPD (chronic obstructive pulmonary disease) (HCC)    Coronary artery disease    H/O heart valve replacement with mechanical valve    Heart attack (HCC)    Hyperlipidemia    Hypertension    Paroxysmal atrial fibrillation (HCC)    Stroke [facility])     ASSESSMENT  Recent Results: The most recent result is correlated with 27 mg per week:  Lab Results  Component Value Date   INR 4.0 (A) 12/07/2020   INR 2.9 11/16/2020   INR 2.4 11/03/2020    Anticoagulation Dosing: Description    INR above goal. HOLD today and then continue current weekly dose of 3 mg every Tue, Thu, Sat; 4.5 mg all other days. Recheck INR in 3 weeks.      INR today: Supratherapeutic. Possibly elevated in the setting of decreased Vit K intake. Previously stable and controlled on current weekly dose and regular Vit K intake of 3-4/servings of collard greens/week. Denies any other complains of bleeding or bruising symptoms. Denies any other relevant changes in diet, medications, or lifestyle changes. Will hold today and continue previously stable weekly dose.   PLAN Weekly dose was unchanged  by 0% to 27 mg/week. HOLD today and then continue current weekly dose of 3 mg every Tues, Thurs, Sat and 4.5 mg all other days. Recheck INR in 3 week.   Patient Instructions  INR above goal. HOLD today and then continue current weekly dose of 3 mg every Tue, Thu, Sat; 4.5 mg all other days. Recheck INR in 3 weeks.  Patient advised to contact clinic or seek medical attention if signs/symptoms of bleeding or thromboembolism occur.  Patient verbalized understanding by repeating back information and was advised to contact me if further medication-related questions arise.   Follow-up Return in about 3 weeks (around 12/28/2020).  Leonides Schanz, PharmD  15 minutes spent face-to-face with the patient during the encounter. 50% of time spent on education, including signs/sx bleeding and clotting, as well as food and drug interactions with warfarin. 50% of time was spent on fingerprick POC INR sample collection,processing, results determination, and documentation

## 2020-12-07 NOTE — Patient Instructions (Signed)
INR above goal. HOLD today and then continue current weekly dose of 3 mg every Tue, Thu, Sat; 4.5 mg all other days. Recheck INR in 3 weeks.

## 2020-12-21 ENCOUNTER — Ambulatory Visit: Payer: Medicaid Other | Admitting: Pharmacist

## 2020-12-21 ENCOUNTER — Other Ambulatory Visit: Payer: Self-pay

## 2020-12-21 DIAGNOSIS — Z5181 Encounter for therapeutic drug level monitoring: Secondary | ICD-10-CM

## 2020-12-21 DIAGNOSIS — Z7901 Long term (current) use of anticoagulants: Secondary | ICD-10-CM

## 2020-12-21 DIAGNOSIS — I480 Paroxysmal atrial fibrillation: Secondary | ICD-10-CM

## 2020-12-21 DIAGNOSIS — Z952 Presence of prosthetic heart valve: Secondary | ICD-10-CM

## 2020-12-21 LAB — POCT INR: INR: 4.6 — AB (ref 2.0–3.0)

## 2020-12-21 NOTE — Progress Notes (Addendum)
Anticoagulation Management Joanna Martinez is a 60 y.o. female who reports to the clinic for monitoring of warfarin treatment.    Indication: atrial fibrillation and s/p Saint Jude mechanical mitral valve replacement in March 2002  ; CHA2DS2 Vasc Score 6 (Female, CHF hx, HTN hx, Stroke/TIA hx, valvular heart disease), HAS-BLED 1 (Stroke hx)   Duration: indefinite Supervising physician: Tessa Lerner  Anticoagulation Clinic Visit History:  Hx of multiple previous strokes. Most recent incidence 3 or 4 years ago. Pt also on Plavix for history of coronary artery disease status post drug-eluting stent in August 2019. Noteded major DDI b/w warfarin and plavix of increased bleeding risk. Noted DDI b/w warfarin and allopurinol about increased anticoagulant effect as well. Pt denies any recent allopurinol dose changes.Pt confirmed to have a tan colored (3 mg) warfarin tablet at home. Pt started taking warfarin in the evening as previously discussed.     Patient does not report signs/symptoms of bleeding or thromboembolism.   Other recent changes: No changes in medications or lifestyle.  Pt reports that she recently seen by PCP and labs were collected. Results notes elevated A1c and LDL. Was started on Repatha and Ozempic injection. Reports that her appetite and Vit K intake continues to be low.   Anticoagulation Episode Summary     Current INR goal:  2.5-3.5  TTR:  52.0 % (1.4 y)  Next INR check:  01/04/2021  INR from last check:  4.6 (12/21/2020)  Weekly max warfarin dose:    Target end date:  Indefinite  INR check location:    Preferred lab:    Send INR reminders to:     Indications   S/P mitral valve replacement [Z95.2] Paroxysmal atrial fibrillation (HCC) [I48.0] Monitoring for long-term anticoagulant use [Z51.81 Z79.01]        Comments:           No Known Allergies  Current Outpatient Medications:    allopurinol (ZYLOPRIM) 100 MG tablet, Take 1 tablet by mouth daily. , Disp: ,  Rfl:    ALPRAZolam (XANAX) 0.5 MG tablet, Take 0.5 mg by mouth 2 (two) times daily as needed., Disp: , Rfl:    amoxicillin (AMOXIL) 500 MG tablet, Take 2,000 mg by mouth once. Prior to dental appt, Disp: , Rfl:    atorvastatin (LIPITOR) 80 MG tablet, Take 1 tablet (80 mg total) by mouth at bedtime., Disp: 90 tablet, Rfl: 1   busPIRone (BUSPAR) 10 MG tablet, Take 1 tablet by mouth in the morning and at bedtime., Disp: , Rfl:    clopidogrel (PLAVIX) 75 MG tablet, Take 1 tablet (75 mg total) by mouth daily. (Patient not taking: No sig reported), Disp: 90 tablet, Rfl: 2   dapagliflozin propanediol (FARXIGA) 10 MG TABS tablet, Take 1 tablet (10 mg total) by mouth daily before breakfast., Disp: 90 tablet, Rfl: 3   Diclofenac Sodium 3 % GEL, Apply topically., Disp: , Rfl:    DULoxetine (CYMBALTA) 30 MG capsule, Take 30 mg by mouth daily., Disp: , Rfl:    enoxaparin (LOVENOX) 100 MG/ML injection, Inject 0.9 mLs (90 mg total) into the skin every 12 (twelve) hours. FOLLOW BRIDGE DIRECTIONS AS INSTRUCTED. DISCARD 0.1 mL PRIOR TO USE FOR EACH INJECTION, Disp: 18 mL, Rfl: 1   ENTRESTO 49-51 MG, TAKE ONE TABLET BY MOUTH TWICE DAILY, Disp: 60 tablet, Rfl: 5   ferrous sulfate 325 (65 FE) MG EC tablet, Take 1 tablet by mouth daily., Disp: , Rfl:    furosemide (LASIX) 20 MG tablet, Take  1 tablet (20 mg total) by mouth every morning., Disp: 90 tablet, Rfl: 2   gabapentin (NEURONTIN) 600 MG tablet, Take 1 capsule by mouth 3 (three) times daily. , Disp: , Rfl:    hydrOXYzine (ATARAX/VISTARIL) 25 MG tablet, Take 25 mg by mouth 2 (two) times daily as needed., Disp: , Rfl:    metoprolol succinate (TOPROL-XL) 25 MG 24 hr tablet, Take 1 tablet (25 mg total) by mouth daily., Disp: 90 tablet, Rfl: 2   naloxone (NARCAN) nasal spray 4 mg/0.1 mL, SMARTSIG:1 Spray(s) Both Nares 1 to 2 Times Daily, Disp: , Rfl:    nitrofurantoin, macrocrystal-monohydrate, (MACROBID) 100 MG capsule, Take 100 mg by mouth 2 (two) times daily., Disp: ,  Rfl:    nystatin (MYCOSTATIN/NYSTOP) powder, Apply topically 2 (two) times daily., Disp: , Rfl:    omeprazole (PRILOSEC) 40 MG capsule, Take 1 capsule by mouth daily., Disp: , Rfl:    oxyCODONE-acetaminophen (PERCOCET) 7.5-325 MG tablet, Take 1 tablet by mouth 4 (four) times daily as needed., Disp: , Rfl:    PROAIR HFA 108 (90 Base) MCG/ACT inhaler, SMARTSIG:1 Puff(s) By Mouth Every 4-6 Hours PRN, Disp: , Rfl:    QUEtiapine (SEROQUEL) 400 MG tablet, Take 400 mg by mouth at bedtime., Disp: , Rfl:    SUTAB [phone] MG TABS, TAKE 24 TABLETS BY MOUTH PER PREP TEST, Disp: , Rfl:    tiZANidine (ZANAFLEX) 4 MG tablet, Take 4 mg by mouth 3 (three) times daily as needed., Disp: , Rfl:    Vitamin D, Ergocalciferol, (DRISDOL) 1.25 MG (50000 UNIT) CAPS capsule, Take 50,000 Units by mouth once a week., Disp: , Rfl:    warfarin (COUMADIN) 3 MG tablet, Take 2 tablets (6 mg total) by mouth daily at 4 PM. Or as directed by the coumadin clinic (Patient taking differently: Take 6 mg by mouth daily at 4 PM. Or as directed by the coumadin clinic  Currently on: 6 mg every Mon; 4.5 mg all other days), Disp: 180 tablet, Rfl: 2 Past Medical History:  Diagnosis Date   Asthma    Cardiomyopathy (HCC)    CHF (congestive heart failure) (HCC)    Chronic kidney disease    COPD (chronic obstructive pulmonary disease) (HCC)    Coronary artery disease    H/O heart valve replacement with mechanical valve    Heart attack (HCC)    Hyperlipidemia    Hypertension    Paroxysmal atrial fibrillation (HCC)    Stroke [facility])     ASSESSMENT  Recent Results: The most recent result is correlated with 27 mg per week:  Lab Results  Component Value Date   INR 4.6 (A) 12/21/2020   INR 4.0 (A) 12/07/2020   INR 2.9 11/16/2020    Anticoagulation Dosing: Description   INR above goal. HOLD tomorrow and then decrease weekly dose to 4.5 mg every Mon, Wed and 3 mg all other days. Recheck INR in 2 weeks.      INR today:  Supratherapeutic. INR continues to remain elevated on current weekly dose in setting of decreased Vit K intake. Pt's appetite remains stable on 1-2 servings/week. Denies any other complains of bleeding or bruising symptoms. No noted DDI b/w warfarin and Repatha/Ozempic. Denies any other relevant changes in diet, medications, or lifestyle changes. In setting of persistently elevated INR reading, will decrease weekly dose. Pt states that she already took her dose today and thus reviewed holding her warfarin x1 tomorrow.   PLAN Weekly dose was decreased by 11% to 24 mg/week.  HOLD tomorrow and then decrease weekly dose to 4.5 mg every Mon, Wed; 3 mg all other days. Recheck INR in 2 weeks  Patient Instructions  INR above goal. HOLD tomorrow and then decrease weekly dose to 4.5 mg every Mon, Wed and 3 mg all other days. Recheck INR in 2 weeks.  Patient advised to contact clinic or seek medical attention if signs/symptoms of bleeding or thromboembolism occur.  Patient verbalized understanding by repeating back information and was advised to contact me if further medication-related questions arise.   Follow-up Return in about 2 weeks (around 01/04/2021).  Leonides Schanz, PharmD  15 minutes spent face-to-face with the patient during the encounter. 50% of time spent on education, including signs/sx bleeding and clotting, as well as food and drug interactions with warfarin. 50% of time was spent on fingerprick POC INR sample collection,processing, results determination, and documentation

## 2020-12-21 NOTE — Patient Instructions (Addendum)
INR above goal. HOLD tomorrow and then decrease weekly dose to 4.5 mg every Mon, Wed and 3 mg all other days. Recheck INR in 2 weeks.

## 2021-01-03 ENCOUNTER — Telehealth: Payer: Self-pay | Admitting: Pharmacist

## 2021-01-04 ENCOUNTER — Ambulatory Visit: Payer: Medicaid Other | Admitting: Pharmacist

## 2021-01-04 ENCOUNTER — Other Ambulatory Visit: Payer: Self-pay

## 2021-01-04 DIAGNOSIS — Z952 Presence of prosthetic heart valve: Secondary | ICD-10-CM

## 2021-01-04 DIAGNOSIS — Z5181 Encounter for therapeutic drug level monitoring: Secondary | ICD-10-CM

## 2021-01-04 DIAGNOSIS — I480 Paroxysmal atrial fibrillation: Secondary | ICD-10-CM

## 2021-01-04 LAB — POCT INR: INR: 2.9 (ref 2.0–3.0)

## 2021-01-04 NOTE — Patient Instructions (Signed)
INR at goal. Continue current weekly dose of 4.5 mg every Mon, Wed and 3 mg all other days. Recheck INR in 2 weeks.

## 2021-01-04 NOTE — Progress Notes (Signed)
Anticoagulation Management Joanna Martinez is a 60 y.o. female who reports to the clinic for monitoring of warfarin treatment.    Indication: atrial fibrillation and s/p Saint Jude mechanical mitral valve replacement in March 2002  ; CHA2DS2 Vasc Score 6 (Female, CHF hx, HTN hx, Stroke/TIA hx, valvular heart disease), HAS-BLED 1 (Stroke hx)   Duration: indefinite Supervising physician: Tessa Lerner  Anticoagulation Clinic Visit History:  Hx of multiple previous strokes. Most recent incidence 3 or 4 years ago. Pt also on Plavix for history of coronary artery disease status post drug-eluting stent in August 2019. Noteded major DDI b/w warfarin and plavix of increased bleeding risk. Noted DDI b/w warfarin and allopurinol about increased anticoagulant effect as well. Pt denies any recent allopurinol dose changes.Pt confirmed to have a tan colored (3 mg) warfarin tablet at home. Pt started taking warfarin in the evening as previously discussed.     Patient does not report signs/symptoms of bleeding or thromboembolism.   Other recent changes: No changes in medications or lifestyle.  Pt reports that she has been having persistent coughing episodes and productive (green) sputum over the past few days. Pt reports associated dark red color (possible blood) whenever she tries to spit out the phlegm. Pt denies any recent complains of fever, chills, or recent sick contact. Pt planing on checking with her PCP for further evaluation.   PA for repatha and entresto completed on pt's behalf. Pt planing on getting synched for monthly pillbox through [facility] starting 01/16/21. Pt reports that she ran out of her Entresto yesterday. Provided pt with samples to allow her to have enough before her next pharmacy shipment.   Anticoagulation Episode Summary     Current INR goal:  2.5-3.5  TTR:  51.6 % (1.5 y)  Next INR check:  01/18/2021  INR from last check:  2.9 (01/04/2021)  Weekly max warfarin dose:     Target end date:  Indefinite  INR check location:    Preferred lab:    Send INR reminders to:     Indications   S/P mitral valve replacement [Z95.2] Paroxysmal atrial fibrillation (HCC) [I48.0] Monitoring for long-term anticoagulant use [Z51.81 Z79.01]        Comments:           No Known Allergies  Current Outpatient Medications:    allopurinol (ZYLOPRIM) 100 MG tablet, Take 1 tablet by mouth daily. , Disp: , Rfl:    ALPRAZolam (XANAX) 0.5 MG tablet, Take 0.5 mg by mouth 2 (two) times daily as needed., Disp: , Rfl:    amoxicillin (AMOXIL) 500 MG tablet, Take 2,000 mg by mouth once. Prior to dental appt, Disp: , Rfl:    atorvastatin (LIPITOR) 80 MG tablet, Take 1 tablet (80 mg total) by mouth at bedtime., Disp: 90 tablet, Rfl: 1   busPIRone (BUSPAR) 10 MG tablet, Take 1 tablet by mouth in the morning and at bedtime., Disp: , Rfl:    clopidogrel (PLAVIX) 75 MG tablet, Take 1 tablet (75 mg total) by mouth daily. (Patient not taking: No sig reported), Disp: 90 tablet, Rfl: 2   dapagliflozin propanediol (FARXIGA) 10 MG TABS tablet, Take 1 tablet (10 mg total) by mouth daily before breakfast., Disp: 90 tablet, Rfl: 3   Diclofenac Sodium 3 % GEL, Apply topically., Disp: , Rfl:    DULoxetine (CYMBALTA) 60 MG capsule, Take 60 mg by mouth daily., Disp: , Rfl:    enoxaparin (LOVENOX) 100 MG/ML injection, Inject 0.9 mLs (90 mg total) into the  skin every 12 (twelve) hours. FOLLOW BRIDGE DIRECTIONS AS INSTRUCTED. DISCARD 0.1 mL PRIOR TO USE FOR EACH INJECTION, Disp: 18 mL, Rfl: 1   ENTRESTO 49-51 MG, TAKE ONE TABLET BY MOUTH TWICE DAILY, Disp: 60 tablet, Rfl: 5   ferrous sulfate 325 (65 FE) MG EC tablet, Take 1 tablet by mouth daily., Disp: , Rfl:    furosemide (LASIX) 20 MG tablet, Take 1 tablet (20 mg total) by mouth every morning., Disp: 90 tablet, Rfl: 2   gabapentin (NEURONTIN) 600 MG tablet, Take 1 capsule by mouth 3 (three) times daily. , Disp: , Rfl:    hydrOXYzine (ATARAX/VISTARIL)  25 MG tablet, Take 25 mg by mouth 2 (two) times daily as needed., Disp: , Rfl:    metoprolol succinate (TOPROL-XL) 25 MG 24 hr tablet, Take 1 tablet (25 mg total) by mouth daily., Disp: 90 tablet, Rfl: 2   naloxone (NARCAN) nasal spray 4 mg/0.1 mL, SMARTSIG:1 Spray(s) Both Nares 1 to 2 Times Daily, Disp: , Rfl:    nitrofurantoin, macrocrystal-monohydrate, (MACROBID) 100 MG capsule, Take 100 mg by mouth 2 (two) times daily., Disp: , Rfl:    nystatin (MYCOSTATIN/NYSTOP) powder, Apply topically 2 (two) times daily., Disp: , Rfl:    omeprazole (PRILOSEC) 40 MG capsule, Take 1 capsule by mouth daily., Disp: , Rfl:    oxyCODONE-acetaminophen (PERCOCET) 7.5-325 MG tablet, Take 1 tablet by mouth 4 (four) times daily as needed., Disp: , Rfl:    PROAIR HFA 108 (90 Base) MCG/ACT inhaler, SMARTSIG:1 Puff(s) By Mouth Every 4-6 Hours PRN, Disp: , Rfl:    QUEtiapine (SEROQUEL) 400 MG tablet, Take 400 mg by mouth at bedtime., Disp: , Rfl:    SUTAB [phone] MG TABS, TAKE 24 TABLETS BY MOUTH PER PREP TEST, Disp: , Rfl:    tiZANidine (ZANAFLEX) 4 MG tablet, Take 4 mg by mouth 3 (three) times daily as needed., Disp: , Rfl:    Vitamin D, Ergocalciferol, (DRISDOL) 1.25 MG (50000 UNIT) CAPS capsule, Take 50,000 Units by mouth once a week., Disp: , Rfl:    warfarin (COUMADIN) 3 MG tablet, Take 2 tablets (6 mg total) by mouth daily at 4 PM. Or as directed by the coumadin clinic (Patient taking differently: Take 6 mg by mouth daily at 4 PM. Or as directed by the coumadin clinic  Currently on: 6 mg every Mon; 4.5 mg all other days), Disp: 180 tablet, Rfl: 2 Past Medical History:  Diagnosis Date   Asthma    Cardiomyopathy (HCC)    CHF (congestive heart failure) (HCC)    Chronic kidney disease    COPD (chronic obstructive pulmonary disease) (HCC)    Coronary artery disease    H/O heart valve replacement with mechanical valve    Heart attack (HCC)    Hyperlipidemia    Hypertension    Paroxysmal atrial fibrillation  (HCC)    Stroke [facility])     ASSESSMENT  Recent Results: The most recent result is correlated with 24 mg per week:  Lab Results  Component Value Date   INR 2.9 01/04/2021   INR 4.6 (A) 12/21/2020   INR 4.0 (A) 12/07/2020    Anticoagulation Dosing: Description   INR at goal. Continue current weekly dose of 4.5 mg every Mon, Wed and 3 mg all other days. Recheck INR in 2 weeks.      INR today: Therapeutic. INR trending down following recent weekly dose decrease Unclear if pt having active infection or bronchitis. Blood in sputum concerning, waiting for PCP evaluation.  Pt's appetite remains stable on 1-2 servings/week. Denies any other complains of bleeding or bruising symptoms. Denies any other relevant changes in diet, medications, or lifestyle changes. Recent weekly dose decrease and concerns of blood in the sputum. Will continue current weekly dose and continue close monitoring and follow up  PLAN Weekly dose was unchanged by 0% to 24 mg/week. Continue current weekly dose of 4.5 mg every Mon, Wed; 3 mg all other days. Recheck INR in 2 weeks  Patient Instructions  INR at goal. Continue current weekly dose of 4.5 mg every Mon, Wed and 3 mg all other days. Recheck INR in 2 weeks.  Patient advised to contact clinic or seek medical attention if signs/symptoms of bleeding or thromboembolism occur.  Patient verbalized understanding by repeating back information and was advised to contact me if further medication-related questions arise.   Follow-up Return in about 2 weeks (around 01/18/2021).  Leonides Schanz, PharmD  15 minutes spent face-to-face with the patient during the encounter. 50% of time spent on education, including signs/sx bleeding and clotting, as well as food and drug interactions with warfarin. 50% of time was spent on fingerprick POC INR sample collection,processing, results determination, and documentation

## 2021-01-06 NOTE — Telephone Encounter (Signed)
Yes, of course

## 2021-01-16 ENCOUNTER — Ambulatory Visit: Payer: Medicaid Other | Admitting: Cardiology

## 2021-01-19 ENCOUNTER — Ambulatory Visit: Payer: Medicaid Other | Admitting: Pharmacist

## 2021-01-19 ENCOUNTER — Other Ambulatory Visit: Payer: Self-pay | Admitting: Pharmacist

## 2021-01-19 ENCOUNTER — Other Ambulatory Visit: Payer: Self-pay

## 2021-01-19 DIAGNOSIS — Z955 Presence of coronary angioplasty implant and graft: Secondary | ICD-10-CM

## 2021-01-19 DIAGNOSIS — I480 Paroxysmal atrial fibrillation: Secondary | ICD-10-CM

## 2021-01-19 DIAGNOSIS — Z952 Presence of prosthetic heart valve: Secondary | ICD-10-CM

## 2021-01-19 DIAGNOSIS — Z5181 Encounter for therapeutic drug level monitoring: Secondary | ICD-10-CM

## 2021-01-19 LAB — POCT INR: INR: 2 (ref 2.0–3.0)

## 2021-01-19 MED ORDER — CLOPIDOGREL BISULFATE 75 MG PO TABS: 75.0000 mg | ORAL_TABLET | Freq: Every day | ORAL | 2 refills | Status: DC

## 2021-01-19 NOTE — Patient Instructions (Signed)
INR below goal. Take 4.5 mg today and then increase weekly dose to 3 mg every Tues, Thus, Sat, 4.5 mg all other days. Recheck INR in 2 weeks.

## 2021-01-19 NOTE — Progress Notes (Signed)
Anticoagulation Management Joanna Martinez is a 60 y.o. female who reports to the clinic for monitoring of warfarin treatment.    Indication: atrial fibrillation and s/p Saint Jude mechanical mitral valve replacement in March 2002  ; CHA2DS2 Vasc Score 6 (Female, CHF hx, HTN hx, Stroke/TIA hx, valvular heart disease), HAS-BLED 1 (Stroke hx)   Duration: indefinite Supervising physician: Tessa Lerner  Anticoagulation Clinic Visit History:  Hx of multiple previous strokes. Most recent incidence 3 or 4 years ago. Pt also on Plavix for history of coronary artery disease status post drug-eluting stent in August 2019. Noteded major DDI b/w warfarin and plavix of increased bleeding risk. Noted DDI b/w warfarin and allopurinol about increased anticoagulant effect as well. Pt denies any recent allopurinol dose changes.Pt confirmed to have a tan colored (3 mg) warfarin tablet at home. Pt started taking warfarin in the evening as previously discussed.     Patient does not report signs/symptoms of bleeding or thromboembolism.   Other recent changes: No changes in medications or lifestyle.  Pt reports that she had recent CXR that didn't show any concerns of pneumonia. Pt notes that her PCP recommended pt start on amoxicillin for concerns of recurrent UTI. Pt to start today for 10 day course. Pt scheduled to follow up with a urologist in the future  Pt notes that she quit smoking yesterday. Using lollipops to help with oral fixation. Pt reports to still having coughing episodes. Pt being considered for a CT scan in the future to evaluate for concerns of lung cancer in setting of long smoking history.   Anticoagulation Episode Summary     Current INR goal:  2.5-3.5  TTR:  51.4 % (1.5 y)  Next INR check:  02/03/2021  INR from last check:  2.0 (01/19/2021)  Weekly max warfarin dose:    Target end date:  Indefinite  INR check location:    Preferred lab:    Send INR reminders to:     Indications   S/P  mitral valve replacement [Z95.2] Paroxysmal atrial fibrillation (HCC) [I48.0] Monitoring for long-term anticoagulant use [Z51.81 Z79.01]        Comments:           No Known Allergies  Current Outpatient Medications:    amoxicillin (AMOXIL) 500 MG tablet, Take 2,000 mg by mouth once. Prior to dental appt, Disp: , Rfl:    atorvastatin (LIPITOR) 80 MG tablet, Take 1 tablet (80 mg total) by mouth at bedtime., Disp: 90 tablet, Rfl: 1   busPIRone (BUSPAR) 10 MG tablet, Take 1 tablet by mouth in the morning and at bedtime., Disp: , Rfl:    clopidogrel (PLAVIX) 75 MG tablet, Take 1 tablet (75 mg total) by mouth daily., Disp: 90 tablet, Rfl: 2   Diclofenac Sodium 3 % GEL, Apply topically., Disp: , Rfl:    DULoxetine (CYMBALTA) 60 MG capsule, Take 60 mg by mouth daily., Disp: , Rfl:    enoxaparin (LOVENOX) 100 MG/ML injection, Inject 0.9 mLs (90 mg total) into the skin every 12 (twelve) hours. FOLLOW BRIDGE DIRECTIONS AS INSTRUCTED. DISCARD 0.1 mL PRIOR TO USE FOR EACH INJECTION, Disp: 18 mL, Rfl: 1   ENTRESTO 49-51 MG, TAKE ONE TABLET BY MOUTH TWICE DAILY, Disp: 60 tablet, Rfl: 5   Evolocumab (REPATHA SURECLICK) 140 MG/ML SOAJ, Inject 140 mg into the skin every 14 (fourteen) days., Disp: , Rfl:    ferrous sulfate 325 (65 FE) MG EC tablet, Take 1 tablet by mouth daily., Disp: , Rfl:  furosemide (LASIX) 20 MG tablet, Take 1 tablet (20 mg total) by mouth every morning., Disp: 90 tablet, Rfl: 2   gabapentin (NEURONTIN) 600 MG tablet, Take 1 capsule by mouth 3 (three) times daily. , Disp: , Rfl:    hydrOXYzine (ATARAX/VISTARIL) 25 MG tablet, Take 25 mg by mouth 2 (two) times daily as needed., Disp: , Rfl:    metoprolol succinate (TOPROL-XL) 25 MG 24 hr tablet, Take 1 tablet (25 mg total) by mouth daily., Disp: 90 tablet, Rfl: 2   naloxone (NARCAN) nasal spray 4 mg/0.1 mL, SMARTSIG:1 Spray(s) Both Nares 1 to 2 Times Daily, Disp: , Rfl:    omeprazole (PRILOSEC) 40 MG capsule, Take 1 capsule by  mouth daily., Disp: , Rfl:    oxyCODONE-acetaminophen (PERCOCET) 7.5-325 MG tablet, Take 1 tablet by mouth 4 (four) times daily as needed., Disp: , Rfl:    PROAIR HFA 108 (90 Base) MCG/ACT inhaler, SMARTSIG:1 Puff(s) By Mouth Every 4-6 Hours PRN, Disp: , Rfl:    QUEtiapine (SEROQUEL) 400 MG tablet, Take 400 mg by mouth at bedtime., Disp: , Rfl:    Vitamin D, Ergocalciferol, (DRISDOL) 1.25 MG (50000 UNIT) CAPS capsule, Take 50,000 Units by mouth once a week., Disp: , Rfl:    warfarin (COUMADIN) 3 MG tablet, Take 2 tablets (6 mg total) by mouth daily at 4 PM. Or as directed by the coumadin clinic (Patient taking differently: Take 6 mg by mouth daily at 4 PM. Or as directed by the coumadin clinic  Currently on: 4.5 mg every Mon, Wed; 3 mg all other days), Disp: 180 tablet, Rfl: 2   tiZANidine (ZANAFLEX) 4 MG tablet, Take 4 mg by mouth 3 (three) times daily as needed. (Patient not taking: Reported on 01/19/2021), Disp: , Rfl:  Past Medical History:  Diagnosis Date   Asthma    Cardiomyopathy (HCC)    CHF (congestive heart failure) (HCC)    Chronic kidney disease    COPD (chronic obstructive pulmonary disease) (HCC)    Coronary artery disease    H/O heart valve replacement with mechanical valve    Heart attack (HCC)    Hyperlipidemia    Hypertension    Paroxysmal atrial fibrillation (HCC)    Stroke [facility])     ASSESSMENT  Recent Results: The most recent result is correlated with 24 mg per week:  Lab Results  Component Value Date   INR 2.0 01/19/2021   INR 2.9 01/04/2021   INR 4.6 (A) 12/21/2020    Anticoagulation Dosing: Description   INR below goal. Take 4.5 mg today and then increase weekly dose to 3 mg every Tues, Thus, Sat, 4.5 mg all other days. Recheck INR in 2 weeks.     INR today: Subtherapeutic. INR continues to trend down on current weekly dose. Pt's appetite remains stable on 1-2 servings/week. Denies any other complains of bleeding or bruising symptoms. Denies any other  relevant changes in diet, medications, or lifestyle changes. Will increase weekly dose in setting of unexplained subtherapeutic INR readings and continue close monitoring and follow up   PLAN Weekly dose was increased by 12.5% to 27 mg/week. Take 4.5 mg today and then increase weekly dose to 3 mg every Tues, Thurs, Sat and 4.5 mg all other days. Recheck INR in 2 weeks  Patient Instructions  INR below goal. Take 4.5 mg today and then increase weekly dose to 3 mg every Tues, Thus, Sat, 4.5 mg all other days. Recheck INR in 2 weeks. Patient advised to contact clinic  or seek medical attention if signs/symptoms of bleeding or thromboembolism occur.  Patient verbalized understanding by repeating back information and was advised to contact me if further medication-related questions arise.   Follow-up Return in about 15 days (around 02/03/2021).  Leonides Schanz, PharmD  15 minutes spent face-to-face with the patient during the encounter. 50% of time spent on education, including signs/sx bleeding and clotting, as well as food and drug interactions with warfarin. 50% of time was spent on fingerprick POC INR sample collection,processing, results determination, and documentation

## 2021-01-25 ENCOUNTER — Ambulatory Visit: Payer: Medicaid Other | Admitting: Cardiology

## 2021-02-03 ENCOUNTER — Other Ambulatory Visit: Payer: Self-pay

## 2021-02-03 ENCOUNTER — Encounter: Payer: Self-pay | Admitting: Cardiology

## 2021-02-03 ENCOUNTER — Ambulatory Visit: Payer: Medicaid Other | Admitting: Cardiology

## 2021-02-03 VITALS — BP 87/58 | HR 81 | Resp 16 | Ht 60.0 in | Wt 205.0 lb

## 2021-02-03 DIAGNOSIS — N183 Chronic kidney disease, stage 3 unspecified: Secondary | ICD-10-CM

## 2021-02-03 DIAGNOSIS — Z7901 Long term (current) use of anticoagulants: Secondary | ICD-10-CM

## 2021-02-03 DIAGNOSIS — I5022 Chronic systolic (congestive) heart failure: Secondary | ICD-10-CM

## 2021-02-03 DIAGNOSIS — Z952 Presence of prosthetic heart valve: Secondary | ICD-10-CM

## 2021-02-03 DIAGNOSIS — Z87891 Personal history of nicotine dependence: Secondary | ICD-10-CM

## 2021-02-03 DIAGNOSIS — Z5181 Encounter for therapeutic drug level monitoring: Secondary | ICD-10-CM

## 2021-02-03 DIAGNOSIS — I255 Ischemic cardiomyopathy: Secondary | ICD-10-CM

## 2021-02-03 DIAGNOSIS — Z8679 Personal history of other diseases of the circulatory system: Secondary | ICD-10-CM

## 2021-02-03 DIAGNOSIS — Z955 Presence of coronary angioplasty implant and graft: Secondary | ICD-10-CM

## 2021-02-03 DIAGNOSIS — I2510 Atherosclerotic heart disease of native coronary artery without angina pectoris: Secondary | ICD-10-CM

## 2021-02-03 DIAGNOSIS — I480 Paroxysmal atrial fibrillation: Secondary | ICD-10-CM

## 2021-02-03 DIAGNOSIS — Z8673 Personal history of transient ischemic attack (TIA), and cerebral infarction without residual deficits: Secondary | ICD-10-CM

## 2021-02-03 DIAGNOSIS — E782 Mixed hyperlipidemia: Secondary | ICD-10-CM

## 2021-02-03 DIAGNOSIS — I130 Hypertensive heart and chronic kidney disease with heart failure and stage 1 through stage 4 chronic kidney disease, or unspecified chronic kidney disease: Secondary | ICD-10-CM

## 2021-02-03 LAB — POCT INR: INR: 4 — AB (ref 2.0–3.0)

## 2021-02-03 NOTE — Patient Instructions (Addendum)
INR above goal. Take 1.5 mg today then decrease weekly dose to 4.5 mg every Mon, Wed, Fri and 3 mg all other days. Recheck INR in 2 weeks.  STOP Amlodipine and Furosemide (water pill)

## 2021-02-03 NOTE — Progress Notes (Signed)
Joanna Martinez Date of Birth: Dec 26, 1960 MRN: [id number] Primary Care Provider:[place], Alfonso Ramus, [place] [facility] Providers: Dr. Thayer Jew [phone] Primary Cardiologist: Rex Kras, DO, [facility] (established care 07/02/2019)  Date: 02/03/21 Last Office Visit: 07/14/2020   Chief Complaint  Patient presents with   Coronary Artery Disease   heart failure management   Follow-up    HPI  Joanna Martinez is a 60 y.o. female who presents to the office with a  chief complaint of "35-monthfollow-up for heart failure management. "   Her past medical history and cardiovascular risk factors are: History of atherosclerotic coronary artery disease with [facility], on oral anticoagulation, hypertension, tobacco smoker, several small strokes, [facility] HFrEF, ischemic cardiomyopathy, postmenopausal female, advanced age, obesity.   Referred to the office given her history of CAD and status post mechanical mitral valve replacement at the request of her primary care physician. She has history of atherosclerotic coronary artery disease with prior PCI to LCX and [facility] [place]. Last heart cath was in November 2019 due to angina which demonstrated scattered moderate non-obstructive disease with 65% stenosis of the ostial RPDA and medical therapy was recommended.  Patient presents for 666-monthollow-up given her complex cardiovascular history as noted above.  Patient states that she is doing well overall and has not been hospitalized or seen in urgent care for cardiovascular symptoms.  In addition, patient has stopped smoking as of November 2022 for which she is congratulated for  at today's office visit.  Patient states that she recently was seen by nephrology yesterday and her blood pressures was 118/60 mmHg and she does not check her blood pressures at home.  Blood pressure at today's office visit are soft.  Patient denies any blood loss via the GI or GU track as she is on Coumadin for mechanical mitral valve and A. fib.  And her INR is supratherapeutic at today's visit.  Patient states that her dyspnea on exertion is relatively stable.  She denies any chest pain at rest or with effort related activities.  Patient states that she has been having frequent urinary tract infections.  Of note patient was started on Farxiga several months ago given her HFrEF.  She has an appointment to see urology in the coming weeks.  FUNCTIONAL STATUS: No exercise or daily routine.    ALLERGIES: No Known Allergies  MEDICATION LIST PRIOR TO VISIT: Current Meds  Medication Sig   allopurinol (ZYLOPRIM) 100 MG tablet Take by mouth.   ALPRAZolam (XANAX) 0.5 MG tablet Take by mouth.   amoxicillin (AMOXIL) 500 MG tablet Take 2,000 mg by mouth once. Prior to dental appt   atorvastatin (LIPITOR) 80 MG tablet Take 1 tablet (80 mg total) by mouth at bedtime.   buPROPion (WELLBUTRIN SR) 150 MG 12 hr tablet Take by mouth.   busPIRone (BUSPAR) 10 MG tablet Take 1 tablet by mouth in the morning and at bedtime.   clopidogrel (PLAVIX) 75 MG tablet Take 1 tablet (75 mg total) by mouth daily.   dapagliflozin propanediol (FARXIGA) 10 MG TABS tablet TAKE ONE TABLET BY MOUTH DAILY BEFORE BREAKFAST   Diclofenac Sodium 3 % GEL Apply topically.   DULoxetine (CYMBALTA) 60 MG capsule Take 60 mg  by mouth daily.   ENTRESTO 49-51 MG TAKE ONE TABLET BY MOUTH TWICE DAILY   Evolocumab (REPATHA SURECLICK) 671 MG/ML SOAJ Inject 140 mg into the skin every 14 (fourteen) days.   ferrous sulfate 325 (65 FE) MG EC tablet Take 1 tablet by mouth daily.   gabapentin (NEURONTIN) 600 MG tablet Take 1 capsule by mouth 3 (three)  times daily.    hydrOXYzine (ATARAX/VISTARIL) 25 MG tablet Take 25 mg by mouth 2 (two) times daily as needed.   metoprolol succinate (TOPROL-XL) 25 MG 24 hr tablet Take 1 tablet (25 mg total) by mouth daily.   naloxone (NARCAN) nasal spray 4 mg/0.1 mL SMARTSIG:1 Spray(s) Both Nares 1 to 2 Times Daily   omeprazole (PRILOSEC) 40 MG capsule Take 1 capsule by mouth daily.   oxyCODONE-acetaminophen (PERCOCET) 7.5-325 MG tablet Take 1 tablet by mouth 4 (four) times daily as needed.   PROAIR HFA 108 (90 Base) MCG/ACT inhaler SMARTSIG:1 Puff(s) By Mouth Every 4-6 Hours PRN   QUEtiapine (SEROQUEL) 400 MG tablet Take 400 mg by mouth at bedtime.   tiZANidine (ZANAFLEX) 4 MG tablet Take 4 mg by mouth 3 (three) times daily as needed.   Vitamin D, Ergocalciferol, (DRISDOL) 1.25 MG (50000 UNIT) CAPS capsule Take 50,000 Units by mouth once a week.   warfarin (COUMADIN) 3 MG tablet Take 2 tablets (6 mg total) by mouth daily at 4 PM. Or as directed by the coumadin clinic (Patient taking differently: Take 6 mg by mouth daily at 4 PM. Or as directed by the coumadin clinic  Currently on: 4.5 mg every Mon, Wed; 3 mg all other days)   [DISCONTINUED] amLODipine (NORVASC) 2.5 MG tablet Take by mouth.   [DISCONTINUED] furosemide (LASIX) 20 MG tablet Take 1 tablet (20 mg total) by mouth every morning.     PAST MEDICAL HISTORY: Past Medical History:  Diagnosis Date   Asthma    Cardiomyopathy ([place])    CHF (congestive heart failure) (HCC)    Chronic kidney disease    COPD (chronic obstructive pulmonary disease) (HCC)    Coronary artery disease    H/O heart valve replacement with mechanical valve    Heart attack (HCC)    Hyperlipidemia    Hypertension    Paroxysmal atrial fibrillation (HCC)    Stroke ([place])     PAST SURGICAL HISTORY: Past Surgical History:  Procedure Laterality Date   CARDIAC SURGERY     CORONARY ANGIOPLASTY WITH STENT PLACEMENT     MITRAL VALVE SURGERY      FAMILY HISTORY: The patient  family history includes Diabetes in her brother, brother, and sister; HIV/AIDS in her father and mother; Hyperlipidemia in her brother, brother, and sister; Hypertension in her brother, brother, and sister.  SOCIAL HISTORY:  The patient  reports that she has been smoking cigarettes. She has been smoking an average of 1 pack per day. She has never used smokeless tobacco. She reports that she does not currently use alcohol. She reports that she does not use drugs.  REVIEW OF SYSTEMS: Review of Systems  Constitutional: Negative for chills and fever.  HENT:  Negative for hoarse voice and nosebleeds.   Eyes:  Negative for discharge, double vision and pain.  Cardiovascular:  Positive for dyspnea on exertion. Negative for chest pain, claudication, leg swelling, near-syncope, orthopnea, palpitations, paroxysmal nocturnal dyspnea and syncope.  Respiratory:  Negative for hemoptysis and shortness of breath.   Musculoskeletal:  Negative for muscle cramps and myalgias.  Gastrointestinal:  Negative for abdominal  pain, constipation, diarrhea, hematemesis, hematochezia, melena, nausea and vomiting.  Genitourinary:        Urinary tract infection   Neurological:  Negative for dizziness, focal weakness, light-headedness, loss of balance and paresthesias.   PHYSICAL EXAM: Vitals with BMI 02/03/2021 07/14/2020 06/05/2020  Height 5' 0"  5' 0"  -  Weight 205 lbs 200 lbs -  BMI 12.75 17.00 -  Systolic 87 174 944  Diastolic 58 76 80  Pulse 81 74 76   CONSTITUTIONAL: Well-developed and well-nourished. No acute distress.  SKIN: Skin is warm and dry. No rash noted. No cyanosis. No pallor. No jaundice HEAD: Normocephalic and atraumatic.  EYES: No scleral icterus MOUTH/THROAT: Moist oral membranes.  NECK: No JVD present. No thyromegaly noted. No carotid bruits   LYMPHATIC: No visible cervical adenopathy.  CHEST Normal respiratory effort. No intercostal retractions  LUNGS: Clear to auscultation bilaterally. No  stridor. No wheezes. No rales.  CARDIOVASCULAR: Regular rate and rhythm, positive H6-P5, mechanical click heard at the apex, no murmurs rubs or gallops appreciated. ABDOMINAL: Obese, soft, nontender, nondistended, positive bowel sounds all 4 quadrants. No apparent ascites.  EXTREMITIES: No peripheral edema. RUE brace to straighten her fingers.  HEMATOLOGIC: No significant bruising NEUROLOGIC: Oriented to person, place, and time. Nonfocal. Normal muscle tone.  Cranial nerves 2-12 grossly intact.   PSYCHIATRIC: Normal mood and affect. Normal behavior. 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  EKG: 07/02/2019: Sinus rhythm, 94 bpm, normal axis, incomplete right bundle branch block, left atrial enlargement, nonspecific T wave abnormality.   06/03/2020: Normal sinus rhythm, 66 bpm, left axis deviation, without underlying injury pattern, occasional PVCs.  02/03/2021: NSR, 78 bpm, left axis, left anterior fascicular block, without underlying injury pattern.  Echocardiogram: 07/05/2020: Moderately depressed LV systolic function with visual EF 35-40%. Left ventricle cavity is normal in size. Normal left ventricular wall thickness. Normal global wall motion. Abnormal septal wall motion due to post-operative septum.  Mechanical mitral valve. No evidence of mitral stenosis. (PG 5.26mHG MG 3.224mG, MVA 1.8). Trace mitral regurgitation. Trace tricuspid regurgitation. No evidence of pulmonary hypertension. Compared to study dated 07/10/2019: LVEF improved from 30-35% to 35-40% otherwise no significant change.   Stress Testing: We will obtain outside records.  Heart Catheterization: November 2019 per report: Proximal LAD 20%. LCx stent patent.  Discrete 20% narrowing noted distal to the stent.  A separate 30% stenosis was noted in the distal LCx. RCA: Diffuse 20-30% narrowing in the  proximal/mid/distal segments. Posterior atrioventricular branch 50 to 60% stenosis in its distal segment prior to the origin of the most distal posterolateral branch. Right PDA smooth tubular lesions with 60 to 70% luminal narrowing.    Lower Extremity Venous Duplex Left leg 06/03/2020: No evidence of deep vein thrombosis of the left lower extremity with normal venous return.  LABORATORY DATA: CBC Latest Ref Rng & Units 06/05/2020  WBC 4.0 - 10.5 K/uL 5.5  Hemoglobin 12.0 - 15.0 g/dL 13.3  Hematocrit 36.0 - 46.0 % 40.1  Platelets 150 - 400 K/uL 350    CMP Latest Ref Rng & Units 07/12/2020 06/05/2020 02/05/2020  Glucose 65 - 99 mg/dL 151(H) 117(H) 109(H)  BUN 8 - 27 mg/dL 15 10 8   Creatinine 0.57 - 1.00 mg/dL 1.13(H) 1.25(H) 0.99  Sodium 134 - 144 mmol/L 139 138 140  Potassium 3.5 - 5.2 mmol/L 5.2 4.0 4.3  Chloride 96 - 106 mmol/L 102 104 106  CO2  20 - 29 mmol/L 23 27 19(L)  Calcium 8.7 - 10.3 mg/dL 9.4 8.8(L) 9.0  Total Protein 6.5 - 8.1 g/dL - 7.2 -  Total Bilirubin 0.3 - 1.2 mg/dL - 0.5 -  Alkaline Phos 38 - 126 U/L - 67 -  AST 15 - 41 U/L - 23 -  ALT 0 - 44 U/L - 13 -    Lipid Panel  No results found for: CHOL, TRIG, HDL, CHOLHDL, VLDL, LDLCALC, LDLDIRECT, LABVLDL  No results found for: HGBA1C No components found for: NTPROBNP No results found for: TSH  BMP Recent Labs    02/05/20 1053 06/05/20 1039 07/12/20 1042  NA 140 138 139  K 4.3 4.0 5.2  CL 106 104 102  CO2 19* 27 23  GLUCOSE 109* 117* 151*  BUN 8 10 15   CREATININE 0.99 1.25* 1.13*  CALCIUM 9.0 8.8* 9.4  GFRNONAA 62 49*  --   GFRAA 72  --   --     CBC No results for input(s): WBC, RBC, HGB, HCT, PLT, MCV, MCH, MCHC, RDW, LYMPHSABS, MONOABS, EOSABS, BASOSABS in the last 168 hours.  Invalid input(s): NEUTRABS  HEMOGLOBIN A1C No results found for: HGBA1C, MPG  Cardiac Panel (last 3 results) No results for input(s): CKTOTAL, CKMB, TROPONINI, RELINDX in the last 8760 hours. No results for input(s):  TROPIPOC in the last 8760 hours.  BNP (last 3 results) Recent Labs    02/05/20 1053 07/12/20 1041  PROBNP 200 120    TSH No results for input(s): TSH in the last 8760 hours.  CHOLESTEROL No results for input(s): CHOL in the last 8760 hours.  Hepatic Function Panel Recent Labs    06/05/20 1039  PROT 7.2  ALBUMIN 3.8  AST 23  ALT 13  ALKPHOS 67  BILITOT 0.5     External Labs: Collected: 06/10/2019 Creatinine 1.06 mg/dL. eGFR: 66 mL/min per 1.73 m Lipid profile: Total cholesterol 304, triglycerides 310, HDL 53, LDL 190 Hemoglobin A1c: 6.3 TSH: 1.26 and T4 0.68 (free)  IMPRESSION:    ICD-10-CM   1. Atherosclerosis of native coronary artery of native heart without angina pectoris  I25.10 EKG 12-Lead    Lipid Panel With LDL/HDL Ratio    LDL cholesterol, direct    CMP14+EGFR    2. History of coronary angioplasty with insertion of stent  Z95.5 Lipid Panel With LDL/HDL Ratio    LDL cholesterol, direct    CMP14+EGFR    3. Ischemic cardiomyopathy  I25.5 Lipid Panel With LDL/HDL Ratio    LDL cholesterol, direct    CMP14+EGFR    4. Chronic HFrEF (heart failure with reduced ejection fraction) (HCC)  I50.22 Pro b natriuretic peptide (BNP)    5. 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 Hx of rheumatic fever  Z86.79     7. S/P mitral valve replacement  Z95.2     8. Paroxysmal atrial fibrillation (HCC)  I48.0     9. Monitoring for long-term anticoagulant use  Z51.81 Hemoglobin and hematocrit, blood   Z79.01     10. Mixed hyperlipidemia  E78.2 Lipid Panel With LDL/HDL Ratio    LDL cholesterol, direct    CMP14+EGFR    11. History of stroke  Z86.73     12. Former smoker  Z87.891        RECOMMENDATIONS: Joanna Martinez is a 60 y.o. 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  Atherosclerotic coronary artery disease with prior PCI without angina: No active chest pain.   Echo results reviewed with her during this office visit -marginal improvement in LVEF. Patient would like to hold off on ischemic evaluation at this time Since last office visit patient has stopped smoking for which she is congratulated for at today's visit. Blood pressures are currently soft. She is currently using a pill pack and therefore principal care management will reach out to her pharmacy to discontinue amlodipine and Lasix. She will also be enrolled into principal care management for blood pressure monitoring. Medications reconciled.  Chronic HFrEF, stage C, NYHA Class II: Asymptomatic. Gained 5 pounds since last office visit.  Has tolerated initiation of Farxiga -but complains of urinary tract infection.  This may be secondary to [place] or organic etiology.  She has an upcoming appointment with urology.  If urology work-up is normal would recommend discontinuation of Farxiga.   Repeat echo noted marginal improvement in LVEF compared to prior studies.  L Patient is asymptomatic and overall euvolemic. Medications reconciled  Reemphasize importance of a low-salt diet.   Fluid restriction to <2L per day, Na restriction < 2g per day Currently in principle care management for HF management.  Ischemic cardiomyopathy: See above  Atrial fibrillation, paroxysmal: Rate control: metoprolol Rhythm control: N/A Thromboembolic prophylaxis: Currently on coumadin goal INR 2.5 to 3.5. Today's INR is 4 CHA2DS2-VASc SCORE is 6 which correlates to 9.8  % risk of stroke per year. Patient does not endorse any evidence of bleeding. Given the soft blood pressures we will check hemoglobin hematocrit.  Long--term oral anticoagulation:  Indication: Paroxysmal atrial fibrillation and mechanical mitral valve. Currently on Coumadin. Patient does not endorse any evidence of bleeding. INR at today's office 4  Status post [facility] mechanical mitral valve secondary to rheumatic mitral valve stenosis in  March 2002: Continue coumadin. She will need antibiotic prophylaxis prior to the procedure.  Patient verbalized understanding.  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  Educated on the importance of complete smoking cessation.   Hyperlipidemia: Continue Lipitor 40 mg p.o. nightly.   Continue Repatha.   Patient does not endorse any myalgias.   Check fasting lipid profile  FINAL MEDICATION LIST END OF ENCOUNTER: No orders of the defined types were placed in this encounter.    Current Outpatient Medications:    allopurinol (ZYLOPRIM) 100 MG tablet, Take by mouth., Disp: , Rfl:    ALPRAZolam (XANAX) 0.5 MG tablet, Take by mouth., Disp: , Rfl:    amoxicillin (AMOXIL) 500 MG tablet, Take 2,000 mg by mouth once. Prior to dental appt, Disp: , Rfl:    atorvastatin (LIPITOR) 80 MG tablet, Take 1 tablet (80 mg total) by mouth at bedtime., Disp: 90 tablet, Rfl: 1   buPROPion (WELLBUTRIN SR) 150 MG 12 hr tablet, Take by mouth., Disp: , Rfl:    busPIRone (BUSPAR) 10 MG tablet, Take 1 tablet by mouth in the morning and at bedtime., Disp: , Rfl:    clopidogrel (PLAVIX) 75 MG tablet, Take 1 tablet (75  mg total) by mouth daily., Disp: 90 tablet, Rfl: 2   dapagliflozin propanediol (FARXIGA) 10 MG TABS tablet, TAKE ONE TABLET BY MOUTH DAILY BEFORE BREAKFAST, Disp: , Rfl:    Diclofenac Sodium 3 % GEL, Apply topically., Disp: , Rfl:    DULoxetine (CYMBALTA) 60 MG capsule, Take 60 mg by  mouth daily., Disp: , Rfl:    ENTRESTO 49-51 MG, TAKE ONE TABLET BY MOUTH TWICE DAILY, Disp: 60 tablet, Rfl: 5   Evolocumab (REPATHA SURECLICK) 256 MG/ML SOAJ, Inject 140 mg into the skin every 14 (fourteen) days., Disp: , Rfl:    ferrous sulfate 325 (65 FE) MG EC tablet, Take 1 tablet by mouth daily., Disp: , Rfl:    gabapentin (NEURONTIN) 600 MG tablet, Take 1 capsule by mouth 3 (three) times daily. , Disp: , Rfl:    hydrOXYzine (ATARAX/VISTARIL) 25 MG tablet, Take 25 mg by mouth 2 (two) times daily as needed., Disp: , Rfl:    metoprolol succinate (TOPROL-XL) 25 MG 24 hr tablet, Take 1 tablet (25 mg total) by mouth daily., Disp: 90 tablet, Rfl: 2   naloxone (NARCAN) nasal spray 4 mg/0.1 mL, SMARTSIG:1 Spray(s) Both Nares 1 to 2 Times Daily, Disp: , Rfl:    omeprazole (PRILOSEC) 40 MG capsule, Take 1 capsule by mouth daily., Disp: , Rfl:    oxyCODONE-acetaminophen (PERCOCET) 7.5-325 MG tablet, Take 1 tablet by mouth 4 (four) times daily as needed., Disp: , Rfl:    PROAIR HFA 108 (90 Base) MCG/ACT inhaler, SMARTSIG:1 Puff(s) By Mouth Every 4-6 Hours PRN, Disp: , Rfl:    QUEtiapine (SEROQUEL) 400 MG tablet, Take 400 mg by mouth at bedtime., Disp: , Rfl:    tiZANidine (ZANAFLEX) 4 MG tablet, Take 4 mg by mouth 3 (three) times daily as needed., Disp: , Rfl:    Vitamin D, Ergocalciferol, (DRISDOL) 1.25 MG (50000 UNIT) CAPS capsule, Take 50,000 Units by mouth once a week., Disp: , Rfl:    warfarin (COUMADIN) 3 MG tablet, Take 2 tablets (6 mg total) by mouth daily at 4 PM. Or as directed by the coumadin clinic (Patient taking differently: Take 6 mg by mouth daily at 4 PM. Or as directed by the coumadin clinic  Currently on: 4.5 mg every Mon, Wed; 3 mg all other days), Disp: 180 tablet, Rfl: 2  Orders Placed This Encounter  Procedures   Lipid Panel With LDL/HDL Ratio   LDL cholesterol, direct   CMP14+EGFR   Hemoglobin and hematocrit, blood   Pro b natriuretic peptide (BNP)   POCT INR   EKG 12-Lead     Patient Instructions  INR above goal. Take 1.5 mg today then decrease weekly dose to 4.5 mg every Mon, Wed, Fri and 3 mg all other days. Recheck INR in 2 weeks.  STOP Amlodipine and Furosemide (water pill)   --Continue cardiac medications as reconciled in final medication list. --Return in about 6 months (around 08/04/2021) for Follow up, heart failure management.. Or sooner if needed. --Continue follow-up with your primary care physician regarding the management of your other chronic comorbid conditions.  Patient's questions and concerns were addressed to her satisfaction. She voices understanding of the instructions provided during this encounter.   This note was created using a voice recognition software as a result there may be grammatical errors inadvertently enclosed that do not reflect the nature of this encounter. Every attempt is made to correct such errors.   Rex Kras, [place], [facility]  Pager: [phone] Office: [phone]

## 2021-02-20 ENCOUNTER — Ambulatory Visit: Payer: Medicaid Other | Admitting: Pharmacist

## 2021-02-20 ENCOUNTER — Other Ambulatory Visit: Payer: Self-pay

## 2021-02-20 DIAGNOSIS — I480 Paroxysmal atrial fibrillation: Secondary | ICD-10-CM

## 2021-02-20 DIAGNOSIS — Z952 Presence of prosthetic heart valve: Secondary | ICD-10-CM

## 2021-02-20 DIAGNOSIS — Z7901 Long term (current) use of anticoagulants: Secondary | ICD-10-CM

## 2021-02-20 LAB — POCT INR: INR: 3.3 — AB (ref 2.0–3.0)

## 2021-02-20 NOTE — Progress Notes (Signed)
Anticoagulation Management Joanna Martinez is a 60 y.o. female who reports to the clinic for monitoring of warfarin treatment.    Indication: atrial fibrillation and s/p Saint Jude mechanical mitral valve replacement in March 2002  ; CHA2DS2 Vasc Score 6 (Female, CHF hx, HTN hx, Stroke/TIA hx, valvular heart disease), HAS-BLED 1 (Stroke hx)   Duration: indefinite Supervising physician: Rex Kras  Anticoagulation Clinic Visit History:  Hx of multiple previous strokes. Most recent incidence 3 or 4 years ago. Pt also on Plavix for history of coronary artery disease status post drug-eluting stent in August 2019. Noteded major DDI b/w warfarin and plavix of increased bleeding risk. Noted DDI b/w warfarin and allopurinol about increased anticoagulant effect as well. Pt denies any recent allopurinol dose changes.Pt confirmed to have a tan colored (3 mg) warfarin tablet at home. Pt started taking warfarin in the evening as previously discussed.     Patient does not report signs/symptoms of bleeding or thromboembolism.   Other recent changes: No changes in medications or lifestyle.  Pt recently seen by a urologist for concerns of recurrent UTI symptoms. Continues to have concerns episodes of hematuria. Reports seeing mild reddish spots in the toilet bowl and on the tissue.   Pt notes that she quit smoking yesterday. Using lollipops to help with oral fixation. Pt reports to still having coughing episodes. Pt being considered for a CT scan in the future to evaluate for concerns of lung cancer in setting of long smoking history.   Anticoagulation Episode Summary     Current INR goal:  2.5-3.5  TTR:  50.7 % (1.6 y)  Next INR check:  03/07/2021  INR from last check:  3.3 (02/20/2021)  Weekly max warfarin dose:    Target end date:  Indefinite  INR check location:    Preferred lab:    Send INR reminders to:     Indications   S/P mitral valve replacement [Z95.2] Paroxysmal atrial fibrillation (HCC)  [I48.0] Monitoring for long-term anticoagulant use [Z51.81 Z79.01]        Comments:           No Known Allergies  Current Outpatient Medications:    allopurinol (ZYLOPRIM) 100 MG tablet, Take by mouth., Disp: , Rfl:    ALPRAZolam (XANAX) 0.5 MG tablet, Take by mouth., Disp: , Rfl:    amoxicillin (AMOXIL) 500 MG tablet, Take 2,000 mg by mouth once. Prior to dental appt, Disp: , Rfl:    atorvastatin (LIPITOR) 80 MG tablet, Take 1 tablet (80 mg total) by mouth at bedtime., Disp: 90 tablet, Rfl: 1   buPROPion (WELLBUTRIN SR) 150 MG 12 hr tablet, Take by mouth., Disp: , Rfl:    busPIRone (BUSPAR) 10 MG tablet, Take 1 tablet by mouth in the morning and at bedtime., Disp: , Rfl:    clopidogrel (PLAVIX) 75 MG tablet, Take 1 tablet (75 mg total) by mouth daily., Disp: 90 tablet, Rfl: 2   dapagliflozin propanediol (FARXIGA) 10 MG TABS tablet, TAKE ONE TABLET BY MOUTH DAILY BEFORE BREAKFAST, Disp: , Rfl:    Diclofenac Sodium 3 % GEL, Apply topically., Disp: , Rfl:    DULoxetine (CYMBALTA) 60 MG capsule, Take 60 mg by mouth daily., Disp: , Rfl:    ENTRESTO 49-51 MG, TAKE ONE TABLET BY MOUTH TWICE DAILY, Disp: 60 tablet, Rfl: 5   Evolocumab (REPATHA SURECLICK) XX123456 MG/ML SOAJ, Inject 140 mg into the skin every 14 (fourteen) days., Disp: , Rfl:    ferrous sulfate 325 (65 FE) MG EC  tablet, Take 1 tablet by mouth daily., Disp: , Rfl:    gabapentin (NEURONTIN) 600 MG tablet, Take 1 capsule by mouth 3 (three) times daily. , Disp: , Rfl:    hydrOXYzine (ATARAX/VISTARIL) 25 MG tablet, Take 25 mg by mouth 2 (two) times daily as needed., Disp: , Rfl:    metoprolol succinate (TOPROL-XL) 25 MG 24 hr tablet, Take 1 tablet (25 mg total) by mouth daily., Disp: 90 tablet, Rfl: 2   naloxone (NARCAN) nasal spray 4 mg/0.1 mL, SMARTSIG:1 Spray(s) Both Nares 1 to 2 Times Daily, Disp: , Rfl:    omeprazole (PRILOSEC) 40 MG capsule, Take 1 capsule by mouth daily., Disp: , Rfl:    oxyCODONE-acetaminophen (PERCOCET)  7.5-325 MG tablet, Take 1 tablet by mouth 4 (four) times daily as needed., Disp: , Rfl:    PROAIR HFA 108 (90 Base) MCG/ACT inhaler, SMARTSIG:1 Puff(s) By Mouth Every 4-6 Hours PRN, Disp: , Rfl:    QUEtiapine (SEROQUEL) 400 MG tablet, Take 400 mg by mouth at bedtime., Disp: , Rfl:    tiZANidine (ZANAFLEX) 4 MG tablet, Take 4 mg by mouth 3 (three) times daily as needed., Disp: , Rfl:    Vitamin D, Ergocalciferol, (DRISDOL) 1.25 MG (50000 UNIT) CAPS capsule, Take 50,000 Units by mouth once a week., Disp: , Rfl:    warfarin (COUMADIN) 3 MG tablet, Take 2 tablets (6 mg total) by mouth daily at 4 PM. Or as directed by the coumadin clinic (Patient taking differently: Take 6 mg by mouth daily at 4 PM. Or as directed by the coumadin clinic  Currently on: 4.5 mg every Mon, Wed; 3 mg all other days), Disp: 180 tablet, Rfl: 2 Past Medical History:  Diagnosis Date   Asthma    Cardiomyopathy ([place])    CHF (congestive heart failure) ([place])    Chronic kidney disease    COPD (chronic obstructive pulmonary disease) ([place])    Coronary artery disease    H/O heart valve replacement with mechanical valve    Heart attack ([place])    Hyperlipidemia    Hypertension    Paroxysmal atrial fibrillation ([place])    Stroke [facility])     ASSESSMENT  Recent Results: The most recent result is correlated with 25.5 mg per week:  Lab Results  Component Value Date   INR 3.3 (A) 02/20/2021   INR 4.0 (A) 02/03/2021   INR 2.0 01/19/2021    Anticoagulation Dosing: Description   INR at goal. Continue weekly dose to 4.5 mg every Mon, Wed, Fri and 3 mg all other days. Recheck INR in 2 weeks.     INR today: Therapeutic. Recent labile INR. Improved following recent weekly dose reduction. Pt's appetite remains stable on 1-2 servings/week. Mild hematuria being followed and worked up closely by urologist. Pt notes no acute changes. Recent Hbg/Hct labs pending for further assessment. Denies any other complains of bleeding or bruising  symptoms. Denies any other relevant changes in diet, medications, or lifestyle changes. Will continue current weekly dose and close monitoring in setting of recent labile INR readings and concerns of hematuria.   PLAN Weekly dose was unchanged by 0% to 25.5 mg/week. Continue taking 4.5 mg every Mon, Wed, Fri, and 3 mg all other days. Recheck INR in 2 weeks  Patient Instructions  INR at goal. Continue weekly dose to 4.5 mg every Mon, Wed, Fri and 3 mg all other days. Recheck INR in 2 weeks. Patient advised to contact clinic or seek medical attention if signs/symptoms of bleeding or  thromboembolism occur.  Patient verbalized understanding by repeating back information and was advised to contact me if further medication-related questions arise.   Follow-up Return in about 15 days (around 03/07/2021).  Leonides Schanz, PharmD  15 minutes spent face-to-face with the patient during the encounter. 50% of time spent on education, including signs/sx bleeding and clotting, as well as food and drug interactions with warfarin. 50% of time was spent on fingerprick POC INR sample collection,processing, results determination, and documentation

## 2021-02-20 NOTE — Patient Instructions (Signed)
INR at goal. Continue weekly dose to 4.5 mg every Mon, Wed, Fri and 3 mg all other days. Recheck INR in 2 weeks.

## 2021-03-02 ENCOUNTER — Telehealth: Payer: Self-pay

## 2021-03-02 NOTE — Telephone Encounter (Signed)
Pt called and stated that she has not taken [place] in over a month. She is wondering if she is still suppose to be taking this and if it is okay for her to start back up in the morning. Please advise.

## 2021-03-03 NOTE — Telephone Encounter (Signed)
Left another voicemail for patient to call back to discuss farxiga.

## 2021-03-03 NOTE — Telephone Encounter (Signed)
Left voicemail for patient to call the office back to discuss further given concern on last visit regarding UTI.

## 2021-03-04 NOTE — Telephone Encounter (Signed)
Thanks.  Teny, look into this if you get hold of her first.   ST

## 2021-03-07 ENCOUNTER — Ambulatory Visit: Payer: Medicaid Other | Admitting: Pharmacist

## 2021-03-07 ENCOUNTER — Other Ambulatory Visit: Payer: Self-pay

## 2021-03-07 DIAGNOSIS — I480 Paroxysmal atrial fibrillation: Secondary | ICD-10-CM

## 2021-03-07 DIAGNOSIS — Z7901 Long term (current) use of anticoagulants: Secondary | ICD-10-CM

## 2021-03-07 DIAGNOSIS — Z952 Presence of prosthetic heart valve: Secondary | ICD-10-CM

## 2021-03-07 LAB — POCT INR: INR: 3.7 — AB (ref 2.0–3.0)

## 2021-03-07 NOTE — Progress Notes (Signed)
Anticoagulation Management Joanna Martinez is a 61 y.o. female who reports to the clinic for monitoring of warfarin treatment.    Indication: atrial fibrillation and s/p Saint Jude mechanical mitral valve replacement in March 2002  ; CHA2DS2 Vasc Score 6 (Female, CHF hx, HTN hx, Stroke/TIA hx, valvular heart disease), HAS-BLED 1 (Stroke hx)   Duration: indefinite Supervising physician: Rex Kras  Anticoagulation Clinic Visit History:  Hx of multiple previous strokes. Most recent incidence 3 or 4 years ago. Pt also on Plavix for history of coronary artery disease status post drug-eluting stent in August 2019. Noteded major DDI b/w warfarin and plavix of increased bleeding risk. Noted DDI b/w warfarin and allopurinol about increased anticoagulant effect as well. Pt denies any recent allopurinol dose changes.Pt confirmed to have a tan colored (3 mg) warfarin tablet at home. Pt started taking warfarin in the evening as previously discussed.     Patient does not report signs/symptoms of bleeding or thromboembolism.   Other recent changes: No changes in medications or lifestyle.  Pt recently seen by urologist for hematuria and urinary incontinence/UTI concerns. Urinary analysis completed and pt is scheduled for follow up cystoscopy and CTU work-up on 03/18/21. UA form 02/17/21 noted to have 2+ urine blood/Hb and elevated Urine RBC. Urine culture positive for corynebacterium. Pt reports that she hasnt heard back regarding her lab results or recommendation for any changes. Pt has an upcoming f/u scheduled for 03/21/21  Pt also reports that she recently had a mammogram completed and was told that there might be something of concern that needs to be biopsied and evaluated further. Pt has a f/u OV scheduled with PCP later this month.    Pt continues to work on smoking cessation. Recently had relapse following hearing about the possible breast tissue mass. Pt reports that she is retrying smoking cessation  again and working on healthier stress management alternatives moving forward. Working with a psychiatrist regarding establish a good counselor/therapist support system. Offered NRT therapy for smoking cessation aid, but prefers to continuing with her current system of using hard candy and redirection.   Anticoagulation Episode Summary     Current INR goal:  2.5-3.5  TTR:  50.6 % (1.6 y)  Next INR check:  03/23/2021  INR from last check:  3.7 (03/07/2021)  Weekly max warfarin dose:    Target end date:  Indefinite  INR check location:    Preferred lab:    Send INR reminders to:     Indications   S/P mitral valve replacement [Z95.2] Paroxysmal atrial fibrillation (HCC) [I48.0] Monitoring for long-term anticoagulant use [Z51.81 Z79.01]        Comments:           No Known Allergies  Current Outpatient Medications:    allopurinol (ZYLOPRIM) 100 MG tablet, Take by mouth., Disp: , Rfl:    ALPRAZolam (XANAX) 0.5 MG tablet, Take by mouth., Disp: , Rfl:    amoxicillin (AMOXIL) 500 MG tablet, Take 2,000 mg by mouth once. Prior to dental appt, Disp: , Rfl:    atorvastatin (LIPITOR) 80 MG tablet, Take 1 tablet (80 mg total) by mouth at bedtime., Disp: 90 tablet, Rfl: 1   buPROPion (WELLBUTRIN SR) 150 MG 12 hr tablet, Take by mouth., Disp: , Rfl:    busPIRone (BUSPAR) 10 MG tablet, Take 1 tablet by mouth in the morning and at bedtime., Disp: , Rfl:    clopidogrel (PLAVIX) 75 MG tablet, Take 1 tablet (75 mg total) by mouth daily., Disp: 90  tablet, Rfl: 2   dapagliflozin propanediol (FARXIGA) 10 MG TABS tablet, TAKE ONE TABLET BY MOUTH DAILY BEFORE BREAKFAST, Disp: , Rfl:    Diclofenac Sodium 3 % GEL, Apply topically., Disp: , Rfl:    DULoxetine (CYMBALTA) 60 MG capsule, Take 60 mg by mouth daily., Disp: , Rfl:    ENTRESTO 49-51 MG, TAKE ONE TABLET BY MOUTH TWICE DAILY, Disp: 60 tablet, Rfl: 5   Evolocumab (REPATHA SURECLICK) XX123456 MG/ML SOAJ, Inject 140 mg into the skin every 14 (fourteen)  days., Disp: , Rfl:    ferrous sulfate 325 (65 FE) MG EC tablet, Take 1 tablet by mouth daily., Disp: , Rfl:    gabapentin (NEURONTIN) 600 MG tablet, Take 1 capsule by mouth 3 (three) times daily. , Disp: , Rfl:    hydrOXYzine (ATARAX/VISTARIL) 25 MG tablet, Take 25 mg by mouth 2 (two) times daily as needed., Disp: , Rfl:    metoprolol succinate (TOPROL-XL) 25 MG 24 hr tablet, Take 1 tablet (25 mg total) by mouth daily., Disp: 90 tablet, Rfl: 2   naloxone (NARCAN) nasal spray 4 mg/0.1 mL, SMARTSIG:1 Spray(s) Both Nares 1 to 2 Times Daily, Disp: , Rfl:    omeprazole (PRILOSEC) 40 MG capsule, Take 1 capsule by mouth daily., Disp: , Rfl:    oxyCODONE-acetaminophen (PERCOCET) 7.5-325 MG tablet, Take 1 tablet by mouth 4 (four) times daily as needed., Disp: , Rfl:    PROAIR HFA 108 (90 Base) MCG/ACT inhaler, SMARTSIG:1 Puff(s) By Mouth Every 4-6 Hours PRN, Disp: , Rfl:    QUEtiapine (SEROQUEL) 400 MG tablet, Take 400 mg by mouth at bedtime., Disp: , Rfl:    tiZANidine (ZANAFLEX) 4 MG tablet, Take 4 mg by mouth 3 (three) times daily as needed., Disp: , Rfl:    Vitamin D, Ergocalciferol, (DRISDOL) 1.25 MG (50000 UNIT) CAPS capsule, Take 50,000 Units by mouth once a week., Disp: , Rfl:    warfarin (COUMADIN) 3 MG tablet, Take 2 tablets (6 mg total) by mouth daily at 4 PM. Or as directed by the coumadin clinic (Patient taking differently: Take 6 mg by mouth daily at 4 PM. Or as directed by the coumadin clinic  Currently on: 4.5 mg every Mon, Wed; 3 mg all other days), Disp: 180 tablet, Rfl: 2 Past Medical History:  Diagnosis Date   Asthma    Cardiomyopathy ([place])    CHF (congestive heart failure) ([place])    Chronic kidney disease    COPD (chronic obstructive pulmonary disease) ([place])    Coronary artery disease    H/O heart valve replacement with mechanical valve    Heart attack ([place])    Hyperlipidemia    Hypertension    Paroxysmal atrial fibrillation ([place])    Stroke [facility])     ASSESSMENT  Recent  Results: The most recent result is correlated with 25.5 mg per week:  Lab Results  Component Value Date   INR 3.7 (A) 03/07/2021   INR 3.3 (A) 02/20/2021   INR 4.0 (A) 02/03/2021    Anticoagulation Dosing: Description   INR above goal. Take 1.5 mg today and then continue weekly dose to 4.5 mg every Mon, Wed, Fri and 3 mg all other days. Recheck INR in 2 weeks.     INR today: Supratherapeutic. Recent labile INR. INR slightly above goal. Pt's appetite remains stable on 1-2 servings/week. Denies any recent worsening of hematuria concerns and being worked up closely by urologist. Pt notes no acute changes. Denies any other complains of bleeding or bruising  symptoms. Denies any other relevant changes in diet, medications, or lifestyle changes. Will dose adjust today and continue close monitoring and follow up closely. Marland Kitchen   PLAN Weekly dose was unchanged by 0% to 25.5 mg/week. Take 1.5 mg today and then continue taking 4.5 mg every Mon, Wed, Fri, and 3 mg all other days. Recheck INR in 2 weeks  Patient Instructions  INR above goal. Take 1.5 mg today and then continue weekly dose to 4.5 mg every Mon, Wed, Fri and 3 mg all other days. Recheck INR in 2 weeks. Patient advised to contact clinic or seek medical attention if signs/symptoms of bleeding or thromboembolism occur.  Patient verbalized understanding by repeating back information and was advised to contact me if further medication-related questions arise.   Follow-up Return in about 16 days (around 03/23/2021).  [name], PharmD  15 minutes spent face-to-face with the patient during the encounter. 50% of time spent on education, including signs/sx bleeding and clotting, as well as food and drug interactions with warfarin. 50% of time was spent on fingerprick POC INR sample collection,processing, results determination, and documentation

## 2021-03-07 NOTE — Telephone Encounter (Signed)
Reviewed with pt. Pt reports she hasnt been taking her farxiga over the past month. Recent urology appt and pt is scheduled to complete cystoscopy and CTU work-up on 03/21/21 for concerns of persistent hematuria, recurrent UTI and urinary incontinence. Pt agreeable to continue with farxiga for now and discuss it further with her urologist at her upcoming [place].

## 2021-03-07 NOTE — Patient Instructions (Addendum)
INR above goal. Take 1.5 mg today and then continue weekly dose to 4.5 mg every Mon, Wed, Fri and 3 mg all other days. Recheck INR in 2 weeks.

## 2021-03-08 NOTE — Telephone Encounter (Signed)
Okay, thank you Teny. c

## 2021-03-23 ENCOUNTER — Other Ambulatory Visit: Payer: Self-pay

## 2021-03-23 ENCOUNTER — Ambulatory Visit: Payer: Medicaid Other | Admitting: Pharmacist

## 2021-03-23 DIAGNOSIS — I480 Paroxysmal atrial fibrillation: Secondary | ICD-10-CM

## 2021-03-23 DIAGNOSIS — Z7901 Long term (current) use of anticoagulants: Secondary | ICD-10-CM

## 2021-03-23 DIAGNOSIS — Z5181 Encounter for therapeutic drug level monitoring: Secondary | ICD-10-CM

## 2021-03-23 DIAGNOSIS — Z952 Presence of prosthetic heart valve: Secondary | ICD-10-CM

## 2021-03-23 LAB — POCT INR: INR: 4 — AB (ref 2.0–3.0)

## 2021-03-23 NOTE — Progress Notes (Signed)
Anticoagulation Management Joanna Martinez is a 61 y.o. female who reports to the clinic for monitoring of warfarin treatment.    Indication: atrial fibrillation and s/p Saint Jude mechanical mitral valve replacement in March 2002  ; CHA2DS2 Vasc Score 6 (Female, CHF hx, HTN hx, Stroke/TIA hx, valvular heart disease), HAS-BLED 1 (Stroke hx)   Duration: indefinite Supervising physician: Rex Kras  Anticoagulation Clinic Visit History:  Hx of multiple previous strokes. Most recent incidence 3 or 4 years ago. Pt also on Plavix for history of coronary artery disease status post drug-eluting stent in August 2019. Noteded major DDI b/w warfarin and plavix of increased bleeding risk. Noted DDI b/w warfarin and allopurinol about increased anticoagulant effect as well. Pt denies any recent allopurinol dose changes.Pt confirmed to have a tan colored (3 mg) warfarin tablet at home. Pt started taking warfarin in the evening as previously discussed.     Patient does not report signs/symptoms of bleeding or thromboembolism.   Other recent changes: No changes in medications or lifestyle.  Pt recently seen by urologist for hematuria and urinary incontinence/UTI concerns. Urinary analysis completed and pt is scheduled for follow up cystoscopy and CTU work-up on 03/18/21. UA form 02/17/21 noted to have 2+ urine blood/Hb and elevated Urine RBC. Urine culture positive for corynebacterium. Pt reports that she hasnt heard back regarding her lab results or recommendation for any changes. Pt has an upcoming f/u scheduled for 04/17/21  Pt also reports that she recently had a mammogram completed and was told that there might be something of concern that needs to be biopsied and evaluated further. Pt reports that biopsy showed that the mass was non-cancerous and was recommended to continue monitoring and follow up routinely.   Pt continues to work on smoking cessation. Offered NRT therapy for smoking cessation aid, but  prefers to continuing with her current system of using hard candy and redirection. Recommended pt discuss with PCP regarding smoking cessation therapy at her upcoming OV.   Pt requested assistance with reviewing Repatha and Ozempic administration technique. Reviewed technique together and pt was able to demonstrate proper administration technique.   Anticoagulation Episode Summary     Current INR goal:  2.5-3.5  TTR:  49.3 % (1.7 y)  Next INR check:  04/06/2021  INR from last check:  4.0 (03/23/2021)  Weekly max warfarin dose:    Target end date:  Indefinite  INR check location:    Preferred lab:    Send INR reminders to:     Indications   S/P mitral valve replacement [Z95.2] Paroxysmal atrial fibrillation (HCC) [I48.0] Monitoring for long-term anticoagulant use [Z51.81 Z79.01]        Comments:           No Known Allergies  Current Outpatient Medications:    allopurinol (ZYLOPRIM) 100 MG tablet, Take by mouth., Disp: , Rfl:    ALPRAZolam (XANAX) 0.5 MG tablet, Take by mouth., Disp: , Rfl:    amoxicillin (AMOXIL) 500 MG tablet, Take 2,000 mg by mouth once. Prior to dental appt, Disp: , Rfl:    atorvastatin (LIPITOR) 80 MG tablet, Take 1 tablet (80 mg total) by mouth at bedtime., Disp: 90 tablet, Rfl: 1   buPROPion (WELLBUTRIN SR) 150 MG 12 hr tablet, Take by mouth., Disp: , Rfl:    busPIRone (BUSPAR) 10 MG tablet, Take 1 tablet by mouth in the morning and at bedtime., Disp: , Rfl:    clopidogrel (PLAVIX) 75 MG tablet, Take 1 tablet (75 mg total)  by mouth daily., Disp: 90 tablet, Rfl: 2   dapagliflozin propanediol (FARXIGA) 10 MG TABS tablet, TAKE ONE TABLET BY MOUTH DAILY BEFORE BREAKFAST, Disp: , Rfl:    Diclofenac Sodium 3 % GEL, Apply topically., Disp: , Rfl:    DULoxetine (CYMBALTA) 60 MG capsule, Take 60 mg by mouth daily., Disp: , Rfl:    ENTRESTO 49-51 MG, TAKE ONE TABLET BY MOUTH TWICE DAILY, Disp: 60 tablet, Rfl: 5   Evolocumab (REPATHA SURECLICK) XX123456 MG/ML SOAJ,  Inject 140 mg into the skin every 14 (fourteen) days., Disp: , Rfl:    ferrous sulfate 325 (65 FE) MG EC tablet, Take 1 tablet by mouth daily., Disp: , Rfl:    gabapentin (NEURONTIN) 600 MG tablet, Take 1 capsule by mouth 3 (three) times daily. , Disp: , Rfl:    hydrOXYzine (ATARAX/VISTARIL) 25 MG tablet, Take 25 mg by mouth 2 (two) times daily as needed., Disp: , Rfl:    metoprolol succinate (TOPROL-XL) 25 MG 24 hr tablet, Take 1 tablet (25 mg total) by mouth daily., Disp: 90 tablet, Rfl: 2   naloxone (NARCAN) nasal spray 4 mg/0.1 mL, SMARTSIG:1 Spray(s) Both Nares 1 to 2 Times Daily, Disp: , Rfl:    omeprazole (PRILOSEC) 40 MG capsule, Take 1 capsule by mouth daily., Disp: , Rfl:    oxyCODONE-acetaminophen (PERCOCET) 10-325 MG tablet, Take by mouth. Take 1 tablet by mouth in the morning and 1 tablet at noon and 1 tablet in the evening and 1 tablet before bedtime, Disp: , Rfl:    PROAIR HFA 108 (90 Base) MCG/ACT inhaler, SMARTSIG:1 Puff(s) By Mouth Every 4-6 Hours PRN, Disp: , Rfl:    QUEtiapine (SEROQUEL) 400 MG tablet, Take 400 mg by mouth at bedtime., Disp: , Rfl:    Semaglutide,0.25 or 0.5MG /DOS, (OZEMPIC, 0.25 OR 0.5 MG/DOSE,) 2 MG/1.5ML SOPN, Inject 0.5 mg into the skin daily., Disp: , Rfl:    tiZANidine (ZANAFLEX) 4 MG tablet, Take 4 mg by mouth 3 (three) times daily as needed., Disp: , Rfl:    Vitamin D, Ergocalciferol, (DRISDOL) 1.25 MG (50000 UNIT) CAPS capsule, Take 50,000 Units by mouth once a week., Disp: , Rfl:    warfarin (COUMADIN) 3 MG tablet, Take 2 tablets (6 mg total) by mouth daily at 4 PM. Or as directed by the coumadin clinic (Patient taking differently: Take 6 mg by mouth daily at 4 PM. Or as directed by the coumadin clinic  Currently on: 4.5 mg every Mon, Wed; 3 mg all other days), Disp: 180 tablet, Rfl: 2 Past Medical History:  Diagnosis Date   Asthma    Cardiomyopathy ([place])    CHF (congestive heart failure) ([place])    Chronic kidney disease    COPD (chronic obstructive  pulmonary disease) ([place])    Coronary artery disease    H/O heart valve replacement with mechanical valve    Heart attack ([place])    Hyperlipidemia    Hypertension    Paroxysmal atrial fibrillation ([place])    Stroke [facility])     ASSESSMENT  Recent Results: The most recent result is correlated with 25.5 mg per week:  Lab Results  Component Value Date   INR 4.0 (A) 03/23/2021   INR 3.7 (A) 03/07/2021   INR 3.3 (A) 02/20/2021    Anticoagulation Dosing: Description   INR above goal. Decrease weekly dose to 4.5 mg every Wed, and 3 mg all other days. Recheck INR in 2 weeks.      INR today: Supratherapeutic. INR continues to  trend up on current weekly dose. Pt's appetite remains stable on 1-2 servings/week. Denies any recent worsening of hematuria concerns and being worked up closely by urologist. Pt notes no acute changes. Denies any other complains of bleeding or bruising symptoms. Denies any other relevant changes in diet, medications, or lifestyle changes. In setting of persistently elevated INR readings, will consider decreasing weekly dose and continue close monitoring and follow up as needed.   PLAN Weekly dose was decreased by 11.8% to 22.5 mg/week. Decrease weekly dose to 4.5 mg every Wed, and 3 mg all other days. Recheck INR in 2 weeks  Patient Instructions  INR above goal. Decrease weekly dose to 4.5 mg every Wed, and 3 mg all other days. Recheck INR in 2 weeks.  Patient advised to contact clinic or seek medical attention if signs/symptoms of bleeding or thromboembolism occur.  Patient verbalized understanding by repeating back information and was advised to contact me if further medication-related questions arise.   Follow-up No follow-ups on file.  [name], PharmD  15 minutes spent face-to-face with the patient during the encounter. 50% of time spent on education, including signs/sx bleeding and clotting, as well as food and drug interactions with warfarin. 50% of time  was spent on fingerprick POC INR sample collection,processing, results determination, and documentation

## 2021-03-23 NOTE — Patient Instructions (Signed)
INR above goal. Decrease weekly dose to 4.5 mg every Wed, and 3 mg all other days. Recheck INR in 2 weeks.

## 2021-03-31 ENCOUNTER — Other Ambulatory Visit: Payer: Self-pay | Admitting: Cardiology

## 2021-03-31 DIAGNOSIS — I2510 Atherosclerotic heart disease of native coronary artery without angina pectoris: Secondary | ICD-10-CM

## 2021-03-31 DIAGNOSIS — I5022 Chronic systolic (congestive) heart failure: Secondary | ICD-10-CM

## 2021-04-06 ENCOUNTER — Ambulatory Visit: Payer: Medicaid Other | Admitting: Pharmacist

## 2021-04-06 ENCOUNTER — Other Ambulatory Visit: Payer: Self-pay

## 2021-04-06 DIAGNOSIS — I480 Paroxysmal atrial fibrillation: Secondary | ICD-10-CM

## 2021-04-06 DIAGNOSIS — Z952 Presence of prosthetic heart valve: Secondary | ICD-10-CM

## 2021-04-06 DIAGNOSIS — Z7901 Long term (current) use of anticoagulants: Secondary | ICD-10-CM

## 2021-04-06 LAB — POCT INR: INR: 3.2 — AB (ref 2.0–3.0)

## 2021-04-06 NOTE — Progress Notes (Signed)
Anticoagulation Management Joanna Martinez is a 61 y.o. female who reports to the clinic for monitoring of warfarin treatment.    Indication: atrial fibrillation and s/p Saint Jude mechanical mitral valve replacement in March 2002  ; CHA2DS2 Vasc Score 6 (Female, CHF hx, HTN hx, Stroke/TIA hx, valvular heart disease), HAS-BLED 1 (Stroke hx)   Duration: indefinite Supervising physician: Rex Kras  Anticoagulation Clinic Visit History:  Hx of multiple previous strokes. Most recent incidence 3 or 4 years ago. Pt also on Plavix for history of coronary artery disease status post drug-eluting stent in August 2019. Noteded major DDI b/w warfarin and plavix of increased bleeding risk. Noted DDI b/w warfarin and allopurinol about increased anticoagulant effect as well. Pt denies any recent allopurinol dose changes. Pt confirmed to have a tan colored (3 mg) warfarin tablet at home. Pt started taking warfarin in the evening as previously discussed.     Patient does not report signs/symptoms of bleeding or thromboembolism.   Other recent changes: No changes in medications or lifestyle.  Pt recently seen by urologist for hematuria and urinary incontinence/UTI concerns. Urinary analysis completed and pt is scheduled for follow up cystoscopy and CTU work-up on 03/18/21. UA form 02/17/21 noted to have 2+ urine blood/Hb and elevated Urine RBC. Urine culture positive for corynebacterium. Pt reports that she hasnt heard back regarding her lab results or recommendation for any changes. Pt has an upcoming f/u scheduled for 04/17/21  Pt continues to work on smoking cessation. Offered NRT therapy for smoking cessation aid, but prefers to continuing with her current system of using hard candy and redirection. Recommended pt discuss with PCP regarding smoking cessation therapy at her upcoming OV.   Pt requested assistance with reviewing Repatha and Ozempic administration technique. Reviewed technique together and pt was  able to demonstrate proper administration technique.   Anticoagulation Episode Summary     Current INR goal:  2.5-3.5  TTR:  49.0 % (1.7 y)  Next INR check:  04/20/2021  INR from last check:  3.2 (04/06/2021)  Weekly max warfarin dose:    Target end date:  Indefinite  INR check location:    Preferred lab:    Send INR reminders to:     Indications   S/P mitral valve replacement [Z95.2] Paroxysmal atrial fibrillation (HCC) [I48.0] Monitoring for long-term anticoagulant use [Z51.81 Z79.01]        Comments:           No Known Allergies  Current Outpatient Medications:    allopurinol (ZYLOPRIM) 100 MG tablet, Take by mouth., Disp: , Rfl:    ALPRAZolam (XANAX) 0.5 MG tablet, Take by mouth., Disp: , Rfl:    amoxicillin (AMOXIL) 500 MG tablet, Take 2,000 mg by mouth once. Prior to dental appt, Disp: , Rfl:    atorvastatin (LIPITOR) 80 MG tablet, Take 1 tablet (80 mg total) by mouth at bedtime., Disp: 90 tablet, Rfl: 1   buPROPion (WELLBUTRIN SR) 150 MG 12 hr tablet, Take by mouth., Disp: , Rfl:    busPIRone (BUSPAR) 10 MG tablet, Take 1 tablet by mouth in the morning and at bedtime., Disp: , Rfl:    clopidogrel (PLAVIX) 75 MG tablet, Take 1 tablet (75 mg total) by mouth daily., Disp: 90 tablet, Rfl: 2   dapagliflozin propanediol (FARXIGA) 10 MG TABS tablet, TAKE ONE TABLET BY MOUTH DAILY BEFORE BREAKFAST, Disp: , Rfl:    Diclofenac Sodium 3 % GEL, Apply topically., Disp: , Rfl:    DULoxetine (CYMBALTA) 60 MG capsule,  Take 60 mg by mouth daily., Disp: , Rfl:    ENTRESTO 49-51 MG, TAKE ONE TABLET BY MOUTH TWICE DAILY, Disp: 60 tablet, Rfl: 5   Evolocumab (REPATHA SURECLICK) XX123456 MG/ML SOAJ, Inject 140 mg into the skin Martinez 14 (fourteen) days., Disp: , Rfl:    ferrous sulfate 325 (65 FE) MG EC tablet, Take 1 tablet by mouth daily., Disp: , Rfl:    gabapentin (NEURONTIN) 600 MG tablet, Take 1 capsule by mouth 3 (three) times daily. , Disp: , Rfl:    hydrOXYzine (ATARAX/VISTARIL) 25 MG  tablet, Take 25 mg by mouth 2 (two) times daily as needed., Disp: , Rfl:    metoprolol succinate (TOPROL-XL) 25 MG 24 hr tablet, TAKE ONE TABLET BY MOUTH ONCE DAILY, Disp: 90 tablet, Rfl: 2   naloxone (NARCAN) nasal spray 4 mg/0.1 mL, SMARTSIG:1 Spray(s) Both Nares 1 to 2 Times Daily, Disp: , Rfl:    omeprazole (PRILOSEC) 40 MG capsule, Take 1 capsule by mouth daily., Disp: , Rfl:    oxyCODONE-acetaminophen (PERCOCET) 10-325 MG tablet, Take by mouth. Take 1 tablet by mouth in the morning and 1 tablet at noon and 1 tablet in the evening and 1 tablet before bedtime, Disp: , Rfl:    PROAIR HFA 108 (90 Base) MCG/ACT inhaler, SMARTSIG:1 Puff(s) By Mouth Martinez 4-6 Hours PRN, Disp: , Rfl:    QUEtiapine (SEROQUEL) 400 MG tablet, Take 400 mg by mouth at bedtime., Disp: , Rfl:    Semaglutide,0.25 or 0.5MG /DOS, (OZEMPIC, 0.25 OR 0.5 MG/DOSE,) 2 MG/1.5ML SOPN, Inject 0.5 mg into the skin daily., Disp: , Rfl:    tiZANidine (ZANAFLEX) 4 MG tablet, Take 4 mg by mouth 3 (three) times daily as needed., Disp: , Rfl:    Vitamin D, Ergocalciferol, (DRISDOL) 1.25 MG (50000 UNIT) CAPS capsule, Take 50,000 Units by mouth once a week., Disp: , Rfl:    warfarin (COUMADIN) 3 MG tablet, Take 2 tablets (6 mg total) by mouth daily at 4 PM. Or as directed by the coumadin clinic (Patient taking differently: Take 6 mg by mouth daily at 4 PM. Or as directed by the coumadin clinic  Currently on: 4.5 mg Martinez Wed; 3 mg all other days), Disp: 180 tablet, Rfl: 2 Past Medical History:  Diagnosis Date   Asthma    Cardiomyopathy ([place])    CHF (congestive heart failure) (HCC)    Chronic kidney disease    COPD (chronic obstructive pulmonary disease) ([place])    Coronary artery disease    H/O heart valve replacement with mechanical valve    Heart attack ([place])    Hyperlipidemia    Hypertension    Paroxysmal atrial fibrillation ([place])    Stroke [facility])     ASSESSMENT  Recent Results: The most recent result is correlated with 22.5 mg per  week:  Lab Results  Component Value Date   INR 3.2 (A) 04/06/2021   INR 4.0 (A) 03/23/2021   INR 3.7 (A) 03/07/2021    Anticoagulation Dosing: Description   INR at goal. Continue current weekly dose of 4.5 mg Martinez Wed, and 3 mg all other days. Recheck INR in 2 weeks.      INR today: Therapeutic. INR improved following recent weekly dose decrease. Pt's appetite remains stable on 1-2 servings/week. Denies any recent worsening of hematuria concerns and being worked up closely by urologist. Pt notes no acute changes. Denies any other complains of bleeding or bruising symptoms. Denies any other relevant changes in diet, medications, or lifestyle changes. Will  continue current weekly dose and continue close monitoring and follow up as needed.   PLAN Weekly dose was unchanged by 0% to 22.5 mg/week. Continue current weekly dose of 4.5 mg Martinez Wed, and 3 mg all other days. Recheck INR in 2 weeks  Patient Instructions  INR at goal. Continue current weekly dose of 4.5 mg Martinez Wed, and 3 mg all other days. Recheck INR in 2 weeks.  Patient advised to contact clinic or seek medical attention if signs/symptoms of bleeding or thromboembolism occur.  Patient verbalized understanding by repeating back information and was advised to contact me if further medication-related questions arise.   Follow-up Return in about 2 weeks (around 04/20/2021).  [name], PharmD  15 minutes spent face-to-face with the patient during the encounter. 50% of time spent on education, including signs/sx bleeding and clotting, as well as food and drug interactions with warfarin. 50% of time was spent on fingerprick POC INR sample collection,processing, results determination, and documentation

## 2021-04-06 NOTE — Patient Instructions (Signed)
INR at goal. Continue current weekly dose of 4.5 mg every Wed, and 3 mg all other days. Recheck INR in 2 weeks.

## 2021-04-20 ENCOUNTER — Other Ambulatory Visit: Payer: Self-pay

## 2021-04-20 ENCOUNTER — Ambulatory Visit: Payer: Medicaid Other | Admitting: Pharmacist

## 2021-04-20 DIAGNOSIS — Z952 Presence of prosthetic heart valve: Secondary | ICD-10-CM

## 2021-04-20 DIAGNOSIS — Z7901 Long term (current) use of anticoagulants: Secondary | ICD-10-CM

## 2021-04-20 DIAGNOSIS — I480 Paroxysmal atrial fibrillation: Secondary | ICD-10-CM

## 2021-04-20 DIAGNOSIS — Z5181 Encounter for therapeutic drug level monitoring: Secondary | ICD-10-CM

## 2021-04-20 LAB — POCT INR: INR: 1.7 — AB (ref 2.0–3.0)

## 2021-04-20 NOTE — Patient Instructions (Signed)
INR below goal. Take 4.5 mg today and then increase weekly dose to 4.5 mg every Wed, Fri and 3 mg all other days. Recheck INR in 2 weeks.

## 2021-04-20 NOTE — Progress Notes (Signed)
Anticoagulation Management Joanna Martinez is a 61 y.o. female who reports to the clinic for monitoring of warfarin treatment.    Indication: atrial fibrillation and s/p Saint Jude mechanical mitral valve replacement in March 2002  ; CHA2DS2 Vasc Score 6 (Female, CHF hx, HTN hx, Stroke/TIA hx, valvular heart disease), HAS-BLED 1 (Stroke hx)   Duration: indefinite Supervising physician: Rex Kras  Anticoagulation Clinic Visit History:  Hx of multiple previous strokes. Most recent incidence 3 or 4 years ago. Pt also on Plavix for history of coronary artery disease status post drug-eluting stent in August 2019. Noteded major DDI b/w warfarin and plavix of increased bleeding risk. Noted DDI b/w warfarin and allopurinol about increased anticoagulant effect as well. Pt denies any recent allopurinol dose changes. Pt confirmed to have a tan colored (3 mg) warfarin tablet at home. Pt started taking warfarin in the evening as previously discussed.     Patient does not report signs/symptoms of bleeding or thromboembolism.   Other recent changes: No changes in medications or lifestyle.  Pt recently seen by urologist for hematuria and urinary incontinence/UTI concerns. Urinary analysis completed and pt is scheduled for follow up cystoscopy and CTU work-up. UA positive again for UTI and pt restarted on nitrofurantoin course on 04/18/21 for 7 day course (expected end date of: ~04/25/21. Pt waiting to reschedule missed f/u urology OV.   Pt continues to work on smoking cessation. Pt recently relapsed and returned to smoking 0.5-1 ppd. Pt reviewed with PCP and was started on Nicoderm patches.   Reviewed recent labs results with pt. LDL noted to have improved since starting Repatha. Pt's TSH low, but her Iron and Vit D levels normalized.    Anticoagulation Episode Summary     Current INR goal:  2.5-3.5  TTR:  49.0 % (1.8 y)  Next INR check:  05/04/2021  INR from last check:  1.7 (04/20/2021)  Weekly max  warfarin dose:    Target end date:  Indefinite  INR check location:    Preferred lab:    Send INR reminders to:     Indications   S/P mitral valve replacement [Z95.2] Paroxysmal atrial fibrillation (HCC) [I48.0] Monitoring for long-term anticoagulant use [Z51.81 Z79.01]        Comments:           No Known Allergies  Current Outpatient Medications:    allopurinol (ZYLOPRIM) 100 MG tablet, Take by mouth., Disp: , Rfl:    atorvastatin (LIPITOR) 80 MG tablet, Take 1 tablet (80 mg total) by mouth at bedtime., Disp: 90 tablet, Rfl: 1   busPIRone (BUSPAR) 10 MG tablet, Take 1 tablet by mouth in the morning and at bedtime., Disp: , Rfl:    clopidogrel (PLAVIX) 75 MG tablet, Take 1 tablet (75 mg total) by mouth daily., Disp: 90 tablet, Rfl: 2   dapagliflozin propanediol (FARXIGA) 10 MG TABS tablet, TAKE ONE TABLET BY MOUTH DAILY BEFORE BREAKFAST, Disp: , Rfl:    Diclofenac Sodium 3 % GEL, Apply topically., Disp: , Rfl:    DULoxetine (CYMBALTA) 60 MG capsule, Take 60 mg by mouth daily., Disp: , Rfl:    ENTRESTO 49-51 MG, TAKE ONE TABLET BY MOUTH TWICE DAILY, Disp: 60 tablet, Rfl: 5   Evolocumab (REPATHA SURECLICK) [id number] MG/ML SOAJ, Inject 140 mg into the skin every 14 (fourteen) days., Disp: , Rfl:    gabapentin (NEURONTIN) 600 MG tablet, Take 1 capsule by mouth 3 (three) times daily. , Disp: , Rfl:    hydrOXYzine (ATARAX/VISTARIL) 25 MG  tablet, Take 25 mg by mouth 2 (two) times daily as needed., Disp: , Rfl:    metoprolol succinate (TOPROL-XL) 25 MG 24 hr tablet, TAKE ONE TABLET BY MOUTH ONCE DAILY, Disp: 90 tablet, Rfl: 2   naloxone (NARCAN) nasal spray 4 mg/0.1 mL, SMARTSIG:1 Spray(s) Both Nares 1 to 2 Times Daily, Disp: , Rfl:    nicotine (NICODERM CQ - DOSED IN MG/24 HR) 7 mg/24hr patch, 7 mg daily., Disp: , Rfl:    nitrofurantoin, macrocrystal-monohydrate, (MACROBID) 100 MG capsule, Take 100 mg by mouth 2 (two) times daily., Disp: , Rfl:    omeprazole (PRILOSEC) 40 MG capsule, Take 1  capsule by mouth daily., Disp: , Rfl:    oxyCODONE-acetaminophen (PERCOCET) 10-325 MG tablet, Take by mouth. Take 1 tablet by mouth in the morning and 1 tablet at noon and 1 tablet in the evening and 1 tablet before bedtime, Disp: , Rfl:    prazosin (MINIPRESS) 1 MG capsule, Take 1 mg by mouth at bedtime., Disp: , Rfl:    PROAIR HFA 108 (90 Base) MCG/ACT inhaler, SMARTSIG:1 Puff(s) By Mouth Every 4-6 Hours PRN, Disp: , Rfl:    QUEtiapine (SEROQUEL) 400 MG tablet, Take 400 mg by mouth at bedtime., Disp: , Rfl:    Semaglutide,0.25 or 0.5MG /DOS, (OZEMPIC, 0.25 OR 0.5 MG/DOSE,) 2 MG/1.5ML SOPN, Inject 0.5 mg into the skin daily., Disp: , Rfl:    tiZANidine (ZANAFLEX) 4 MG tablet, Take 4 mg by mouth 3 (three) times daily as needed., Disp: , Rfl:    Vitamin D, Ergocalciferol, (DRISDOL) 1.25 MG (50000 UNIT) CAPS capsule, Take 50,000 Units by mouth once a week., Disp: , Rfl:    warfarin (COUMADIN) 3 MG tablet, Take 2 tablets (6 mg total) by mouth daily at 4 PM. Or as directed by the coumadin clinic (Patient taking differently: Take 6 mg by mouth daily at 4 PM. Or as directed by the coumadin clinic  Currently on: 4.5 mg every Wed; 3 mg all other days), Disp: 180 tablet, Rfl: 2   amoxicillin (AMOXIL) 500 MG tablet, Take 2,000 mg by mouth once. Prior to dental appt, Disp: , Rfl:  Past Medical History:  Diagnosis Date   Asthma    Cardiomyopathy ([place])    CHF (congestive heart failure) (HCC)    Chronic kidney disease    COPD (chronic obstructive pulmonary disease) (HCC)    Coronary artery disease    H/O heart valve replacement with mechanical valve    Heart attack (HCC)    Hyperlipidemia    Hypertension    Paroxysmal atrial fibrillation ([place])    Stroke [facility])     ASSESSMENT  Recent Results: The most recent result is correlated with 24 mg per week:  Lab Results  Component Value Date   INR 1.7 (A) 04/20/2021   INR 3.2 (A) 04/06/2021   INR 4.0 (A) 03/23/2021    Anticoagulation  Dosing: Description   INR below goal. Take 4.5 mg today and then increase weekly dose to 4.5 mg every Wed, Fri and 3 mg all other days. Recheck INR in 2 weeks.      INR today: Subtherapeutic. INR trending down on current weekly dose. Pt's appetite remains stable on 1-2 servings/week. Denies any recent worsening of hematuria concerns and being worked up closely by urologist. Pt notes no acute changes. Denies any other complains of bleeding or bruising symptoms. Denies any other relevant changes in diet, medications, or lifestyle changes. In setting of unexplained INR drop, will increase weekly dose and  continue close monitoring and follow up  PLAN Weekly dose was increased by 6.7% to 24 mg/week. Take 4.5 mg today and then increase weekly dose of 4.5 mg every Wed, Fri and 3 mg all other days. Recheck INR in 2 weeks  Patient Instructions  INR below goal. Take 4.5 mg today and then increase weekly dose to 4.5 mg every Wed, Fri and 3 mg all other days. Recheck INR in 2 weeks.  Patient advised to contact clinic or seek medical attention if signs/symptoms of bleeding or thromboembolism occur.  Patient verbalized understanding by repeating back information and was advised to contact me if further medication-related questions arise.   Follow-up Return in about 2 weeks (around 05/04/2021).  [name], PharmD  15 minutes spent face-to-face with the patient during the encounter. 50% of time spent on education, including signs/sx bleeding and clotting, as well as food and drug interactions with warfarin. 50% of time was spent on fingerprick POC INR sample collection,processing, results determination, and documentation

## 2021-04-26 ENCOUNTER — Other Ambulatory Visit: Payer: Self-pay | Admitting: Cardiology

## 2021-04-26 DIAGNOSIS — I255 Ischemic cardiomyopathy: Secondary | ICD-10-CM

## 2021-04-26 DIAGNOSIS — I5022 Chronic systolic (congestive) heart failure: Secondary | ICD-10-CM

## 2021-04-26 DIAGNOSIS — Z955 Presence of coronary angioplasty implant and graft: Secondary | ICD-10-CM

## 2021-04-26 DIAGNOSIS — I2510 Atherosclerotic heart disease of native coronary artery without angina pectoris: Secondary | ICD-10-CM

## 2021-04-26 MED ORDER — VASCEPA 1 G PO CAPS: 2.0000 g | ORAL_CAPSULE | Freq: Two times a day (BID) | ORAL | 2 refills | Status: DC

## 2021-04-26 NOTE — Progress Notes (Signed)
External Labs: Collected: 03/17/2021. Total cholesterol 150, triglycerides 304, HDL 33, LDL 56. Sodium 139, potassium 4.1, chloride 105, bicarb 25. BUN 11, creatinine 1.3 AST 16, ALT 16, alkaline phosphatase 91 (all within normal limits) eGFR 43 mL/min per 1.73 m TSH 0.091, T40.69 (both below normal limits)     Outside labs independently reviewed.   Patient's triglyceride levels are not well controlled.  We will send in a prescription of Vascepa.  Please order and release fasting lipid profile and direct LDL in 6 weeks.   Also inform the patient that her thyroid levels are not well controlled will defer management to primary team.  Orders Placed This Encounter  Procedures   Lipid Panel With LDL/HDL Ratio    Standing Status:   Future    Number of Occurrences:   1    Standing Expiration Date:   04/26/2022   LDL cholesterol, direct    Standing Status:   Future    Number of Occurrences:   1    Standing Expiration Date:   04/26/2022   CMP14+EGFR    Standing Status:   Future    Number of Occurrences:   1    Standing Expiration Date:   04/26/2022    Rex Kras, DO, [facility]

## 2021-04-28 ENCOUNTER — Other Ambulatory Visit: Payer: Self-pay

## 2021-04-28 DIAGNOSIS — I2510 Atherosclerotic heart disease of native coronary artery without angina pectoris: Secondary | ICD-10-CM

## 2021-04-28 DIAGNOSIS — I5022 Chronic systolic (congestive) heart failure: Secondary | ICD-10-CM

## 2021-04-28 NOTE — Progress Notes (Signed)
Patient is aware of new medication and labs have been ordered and released

## 2021-05-04 ENCOUNTER — Other Ambulatory Visit: Payer: Self-pay

## 2021-05-04 ENCOUNTER — Ambulatory Visit: Payer: Medicaid Other | Admitting: Pharmacist

## 2021-05-04 ENCOUNTER — Telehealth: Payer: Self-pay

## 2021-05-04 DIAGNOSIS — Z7901 Long term (current) use of anticoagulants: Secondary | ICD-10-CM

## 2021-05-04 DIAGNOSIS — I480 Paroxysmal atrial fibrillation: Secondary | ICD-10-CM

## 2021-05-04 DIAGNOSIS — Z5181 Encounter for therapeutic drug level monitoring: Secondary | ICD-10-CM

## 2021-05-04 DIAGNOSIS — Z952 Presence of prosthetic heart valve: Secondary | ICD-10-CM

## 2021-05-04 LAB — POCT INR: INR: 3.1 — AB (ref 2.0–3.0)

## 2021-05-04 NOTE — Progress Notes (Signed)
Anticoagulation Management ?Joanna Martinez is a 60 y.o. female who reports to the clinic for monitoring of warfarin treatment.   ? ?Indication: atrial fibrillation and s/p Saint Jude mechanical mitral valve replacement in March 2002  ; CHA2DS2 Vasc Score 6 (Female, CHF hx, HTN hx, Stroke/TIA hx, valvular heart disease), HAS-BLED 1 (Stroke hx)  ? ?Duration: indefinite ?Supervising physician: Tessa Lerner ? ?Anticoagulation Clinic Visit History: ? ?Hx of multiple previous strokes. Most recent incidence 3 or 4 years ago. Pt also on Plavix for history of coronary artery disease status post drug-eluting stent in August 2019. Noteded major DDI b/w warfarin and plavix of increased bleeding risk. Noted DDI b/w warfarin and allopurinol about increased anticoagulant effect as well. Pt denies any recent allopurinol dose changes. Pt confirmed to have a tan colored (3 mg) warfarin tablet at home. Pt started taking warfarin in the evening as previously discussed.    ? ?Patient does not report signs/symptoms of bleeding or thromboembolism.  ? ?Other recent changes: No changes in medications or lifestyle. ? ?Pt recently seen by urologist for hematuria and urinary incontinence/UTI concerns. Urinary analysis completed and pt is scheduled for follow up cystoscopy and CTU work-up. Recently finished course of Abx and reports no recurrence in symptoms. Denies any further recurrence of hematuria concerns. Pt waiting to reschedule missed f/u urology OV.  ? ?Pt reports that she recently increased her Vit K intake. Current intake includes Broccoli (3-4x/week), collard greens (1x), salads (2x/week) ? ?Pt continues to work on smoking cessation. Pt recently relapsed and returned to smoking 0.5-1 ppd. Pt reviewed with PCP and was started on Nicoderm patches.  ? ?Reviewed recent labs results with pt. LDL noted to have improved since starting Repatha. Pt's TSH low, but her Iron and Vit D levels normalized.  Vascepa prior auth started to manage  elevated TG  ? ?Anticoagulation Episode Summary   ? ? Current INR goal:  2.5-3.5  ?TTR:  48.9 % (1.8 y)  ?Next INR check:  05/18/2021  ?INR from last check:  3.1 (05/04/2021)  ?Weekly max warfarin dose:    ?Target end date:  Indefinite  ?INR check location:    ?Preferred lab:    ?Send INR reminders to:    ? Indications   ?S/P mitral valve replacement [Z95.2] ?Paroxysmal atrial fibrillation (HCC) [I48.0] ?Monitoring for long-term anticoagulant use [Z51.81 ?Z79.01] ? ?  ?  ? ? Comments:    ?  ? ?  ? ? ?No Known Allergies ? ?Current Outpatient Medications:  ?  allopurinol (ZYLOPRIM) 100 MG tablet, Take by mouth., Disp: , Rfl:  ?  amoxicillin (AMOXIL) 500 MG tablet, Take 2,000 mg by mouth once. Prior to dental appt, Disp: , Rfl:  ?  atorvastatin (LIPITOR) 80 MG tablet, Take 1 tablet (80 mg total) by mouth at bedtime., Disp: 90 tablet, Rfl: 1 ?  busPIRone (BUSPAR) 10 MG tablet, Take 1 tablet by mouth in the morning and at bedtime., Disp: , Rfl:  ?  clopidogrel (PLAVIX) 75 MG tablet, Take 1 tablet (75 mg total) by mouth daily., Disp: 90 tablet, Rfl: 2 ?  dapagliflozin propanediol (FARXIGA) 10 MG TABS tablet, TAKE ONE TABLET BY MOUTH DAILY BEFORE BREAKFAST, Disp: , Rfl:  ?  Diclofenac Sodium 3 % GEL, Apply topically., Disp: , Rfl:  ?  DULoxetine (CYMBALTA) 60 MG capsule, Take 60 mg by mouth daily., Disp: , Rfl:  ?  ENTRESTO 49-51 MG, TAKE ONE TABLET BY MOUTH TWICE DAILY, Disp: 60 tablet, Rfl: 5 ?  Evolocumab (  REPATHA SURECLICK) 140 MG/ML SOAJ, Inject 140 mg into the skin every 14 (fourteen) days., Disp: , Rfl:  ?  gabapentin (NEURONTIN) 600 MG tablet, Take 1 capsule by mouth 3 (three) times daily. , Disp: , Rfl:  ?  hydrOXYzine (ATARAX/VISTARIL) 25 MG tablet, Take 25 mg by mouth 2 (two) times daily as needed., Disp: , Rfl:  ?  metoprolol succinate (TOPROL-XL) 25 MG 24 hr tablet, TAKE ONE TABLET BY MOUTH ONCE DAILY, Disp: 90 tablet, Rfl: 2 ?  naloxone (NARCAN) nasal spray 4 mg/0.1 mL, SMARTSIG:1 Spray(s) Both Nares 1 to 2  Times Daily, Disp: , Rfl:  ?  nicotine (NICODERM CQ - DOSED IN MG/24 HR) 7 mg/24hr patch, 7 mg daily., Disp: , Rfl:  ?  nitrofurantoin, macrocrystal-monohydrate, (MACROBID) 100 MG capsule, Take 100 mg by mouth 2 (two) times daily., Disp: , Rfl:  ?  omeprazole (PRILOSEC) 40 MG capsule, Take 1 capsule by mouth daily., Disp: , Rfl:  ?  oxyCODONE-acetaminophen (PERCOCET) 10-325 MG tablet, Take by mouth. Take 1 tablet by mouth in the morning and 1 tablet at noon and 1 tablet in the evening and 1 tablet before bedtime, Disp: , Rfl:  ?  prazosin (MINIPRESS) 1 MG capsule, Take 1 mg by mouth at bedtime., Disp: , Rfl:  ?  PROAIR HFA 108 (90 Base) MCG/ACT inhaler, SMARTSIG:1 Puff(s) By Mouth Every 4-6 Hours PRN, Disp: , Rfl:  ?  QUEtiapine (SEROQUEL) 400 MG tablet, Take 400 mg by mouth at bedtime., Disp: , Rfl:  ?  Semaglutide,0.25 or 0.5MG /DOS, (OZEMPIC, 0.25 OR 0.5 MG/DOSE,) 2 MG/1.5ML SOPN, Inject 0.5 mg into the skin daily., Disp: , Rfl:  ?  tiZANidine (ZANAFLEX) 4 MG tablet, Take 4 mg by mouth 3 (three) times daily as needed., Disp: , Rfl:  ?  VASCEPA 1 g capsule, Take 2 capsules (2 g total) by mouth 2 (two) times daily., Disp: 120 capsule, Rfl: 2 ?  Vitamin D, Ergocalciferol, (DRISDOL) 1.25 MG (50000 UNIT) CAPS capsule, Take 50,000 Units by mouth once a week., Disp: , Rfl:  ?  warfarin (COUMADIN) 3 MG tablet, Take 2 tablets (6 mg total) by mouth daily at 4 PM. Or as directed by the coumadin clinic (Patient taking differently: Take 6 mg by mouth daily at 4 PM. Or as directed by the coumadin clinic  Currently on: 4.5 mg every Wed; 3 mg all other days), Disp: 180 tablet, Rfl: 2 ?Past Medical History:  ?Diagnosis Date  ? Asthma   ? Cardiomyopathy (HCC)   ? CHF (congestive heart failure) (HCC)   ? Chronic kidney disease   ? COPD (chronic obstructive pulmonary disease) (HCC)   ? Coronary artery disease   ? H/O heart valve replacement with mechanical valve   ? Heart attack (HCC)   ? Hyperlipidemia   ? Hypertension   ?  Paroxysmal atrial fibrillation (HCC)   ? Stroke [facility])   ? ? ?ASSESSMENT ? ?Recent Results: ?The most recent result is correlated with 24 mg per week: ? ?Lab Results  ?Component Value Date  ? INR 3.1 (A) 05/04/2021  ? INR 1.7 (A) 04/20/2021  ? INR 3.2 (A) 04/06/2021  ? ? ?Anticoagulation Dosing: ?Description   ?INR at goal. Continue current weekly dose of 4.5 mg every Wed, Fri and 3 mg all other days. Recheck INR in 2 weeks. ?  ?  ?INR today: Therapeutic. INR improved following recently weekly dose increase. Recent increased Vit K intake also contributing to increased anticoagulation effect. Denies any recurrence of  hematuria concerns. Pt notes no acute changes. Denies any other complains of bleeding or bruising symptoms. Denies any other relevant changes in diet, medications, or lifestyle changes. Will continue current weekly dose and continue close monitoring and follow up.  ? ?PLAN ?Weekly dose was unchanged by 0% to 24 mg/week. Continue taking 4.5 mg every Wed and Fri and 3 mg all other days. Recheck INR in 2 weeks.  ? ?Patient Instructions  ?INR at goal. Continue current weekly dose of 4.5 mg every Wed, Fri and 3 mg all other days. Recheck INR in 2 weeks. ? ?Patient advised to contact clinic or seek medical attention if signs/symptoms of bleeding or thromboembolism occur. ? ?Patient verbalized understanding by repeating back information and was advised to contact me if further medication-related questions arise.  ? ?Follow-up ?Return in about 2 weeks (around 05/18/2021). ? ?Leonides Schanz, PharmD ? ?15 minutes spent face-to-face with the patient during the encounter. 50% of time spent on education, including signs/sx bleeding and clotting, as well as food and drug interactions with warfarin. 50% of time was spent on fingerprick POC INR sample collection,processing, results determination, and documentation ?

## 2021-05-04 NOTE — Patient Instructions (Signed)
INR at goal. Continue current weekly dose of 4.5 mg every Wed, Fri and 3 mg all other days. Recheck INR in 2 weeks. ?

## 2021-05-04 NOTE — Telephone Encounter (Signed)
Vascepa appeal faxed to [place] tracks I gave it to [place] to f/u on  ?

## 2021-05-18 ENCOUNTER — Ambulatory Visit: Payer: Medicaid Other | Admitting: Pharmacist

## 2021-05-18 ENCOUNTER — Other Ambulatory Visit: Payer: Self-pay

## 2021-05-18 ENCOUNTER — Other Ambulatory Visit: Payer: Self-pay | Admitting: Pharmacist

## 2021-05-18 LAB — POCT INR: INR: 1.8 — AB (ref 2.0–3.0)

## 2021-05-18 MED ORDER — ENOXAPARIN SODIUM 100 MG/ML IJ SOSY: 90.0000 mg | PREFILLED_SYRINGE | Freq: Two times a day (BID) | INTRAMUSCULAR | 1 refills | Status: DC

## 2021-05-18 NOTE — Addendum Note (Signed)
Addended by: Cassell Clement T on: 05/18/2021 11:34 AM ? ? Modules accepted: Orders ? ?

## 2021-05-18 NOTE — Patient Instructions (Addendum)
INR below goal. Start Lovenox 90 mg twice a day. Take 6 mg today and then continue current weekly dose of 4.5 mg every Wed, Fri and 3 mg all other days. Recheck INR in 5 days.  ?

## 2021-05-18 NOTE — Progress Notes (Signed)
Anticoagulation Management ?Joanna Martinez is a 61 y.o. female who reports to the clinic for monitoring of warfarin treatment.   ? ?Indication: atrial fibrillation and s/p Saint Jude mechanical mitral valve replacement in March 2002  ; CHA2DS2 Vasc Score 6 (Female, CHF hx, HTN hx, Stroke/TIA hx, valvular heart disease), HAS-BLED 1 (Stroke hx)  ? ?Duration: indefinite ?Supervising physician: Tessa LernerSunit Tolia ? ?Anticoagulation Clinic Visit History: ? ?Hx of multiple previous strokes. Most recent incidence 3 or 4 years ago. Pt also on Plavix for history of coronary artery disease status post drug-eluting stent in August 2019. Noteded major DDI b/w warfarin and plavix of increased bleeding risk. Noted DDI b/w warfarin and allopurinol about increased anticoagulant effect as well. Pt denies any recent allopurinol dose changes. Pt confirmed to have a tan colored (3 mg) warfarin tablet at home. Pt started taking warfarin in the evening as previously discussed.    ? ?Patient does not report signs/symptoms of bleeding or thromboembolism.  ? ?Other recent changes: No changes in medications or lifestyle. ? ?Pt recently seen by urologist for hematuria and urinary incontinence/UTI concerns. Urinary analysis completed and pt is scheduled for follow up cystoscopy and CTU work-up. Pt cancelled and was lost to follow up. Pt had further 3-4 episode of UTI since. Most recently, pt reports that she was seen by her PCP on 3/14/2 for concerns of frequent urination, malodorous urine. Urine analysis done by PCP that was noted to be positive for UTI. Pt was started on another 7 day course of macrobid.  ? ?Pt reports that she recently increased her Vit K intake. Current intake includes Broccoli (3-4x/week), collard greens (1x), salads (2x/week) ? ?Pt continues to work on smoking cessation. Pt recently relapsed and returned to smoking 0.5-1 ppd. Pt reviewed with PCP and was started on Nicoderm patches.  ? ?Pt reports that she noticed sudden onset  worsening lower extremity swelling. Symptoms onset two days ago, but has improved as of today. Swelling significantly more prominent on the right leg vs left leg. Pt endorses significant pain throughout her right leg during the episode. Denies significant bruising, color changes, SOB, chest pain complains.  ? ?Anticoagulation Episode Summary   ? ? Current INR goal:  2.5-3.5  ?TTR:  48.9 % (1.8 y)  ?Next INR check:  05/23/2021  ?INR from last check:  1.8 (05/18/2021)  ?Weekly max warfarin dose:    ?Target end date:  Indefinite  ?INR check location:    ?Preferred lab:    ?Send INR reminders to:    ? Indications   ?S/P mitral valve replacement [Z95.2] ?Paroxysmal atrial fibrillation (HCC) [I48.0] ?Monitoring for long-term anticoagulant use [Z51.81 ?Z79.01] ? ?  ?  ? ? Comments:    ?  ? ?  ? ? ?No Known Allergies ? ?Current Outpatient Medications:  ?  allopurinol (ZYLOPRIM) 100 MG tablet, Take by mouth., Disp: , Rfl:  ?  amoxicillin (AMOXIL) 500 MG tablet, Take 2,000 mg by mouth once. Prior to dental appt, Disp: , Rfl:  ?  atorvastatin (LIPITOR) 80 MG tablet, Take 1 tablet (80 mg total) by mouth at bedtime., Disp: 90 tablet, Rfl: 1 ?  busPIRone (BUSPAR) 10 MG tablet, Take 1 tablet by mouth in the morning and at bedtime., Disp: , Rfl:  ?  clopidogrel (PLAVIX) 75 MG tablet, Take 1 tablet (75 mg total) by mouth daily., Disp: 90 tablet, Rfl: 2 ?  dapagliflozin propanediol (FARXIGA) 10 MG TABS tablet, TAKE ONE TABLET BY MOUTH DAILY BEFORE BREAKFAST, Disp: ,  Rfl:  ?  Diclofenac Sodium 3 % GEL, Apply topically., Disp: , Rfl:  ?  DULoxetine (CYMBALTA) 60 MG capsule, Take 60 mg by mouth daily., Disp: , Rfl:  ?  enoxaparin (LOVENOX) 100 MG/ML injection, Inject 0.9 mLs (90 mg total) into the skin every 12 (twelve) hours. Discard 0.1 mL prior to each injection., Disp: 10 mL, Rfl: 1 ?  ENTRESTO 49-51 MG, TAKE ONE TABLET BY MOUTH TWICE DAILY, Disp: 60 tablet, Rfl: 5 ?  Evolocumab (REPATHA SURECLICK) 140 MG/ML SOAJ, Inject 140 mg into  the skin every 14 (fourteen) days., Disp: , Rfl:  ?  gabapentin (NEURONTIN) 600 MG tablet, Take 1 capsule by mouth 3 (three) times daily. , Disp: , Rfl:  ?  hydrOXYzine (ATARAX/VISTARIL) 25 MG tablet, Take 25 mg by mouth 2 (two) times daily as needed., Disp: , Rfl:  ?  metoprolol succinate (TOPROL-XL) 25 MG 24 hr tablet, TAKE ONE TABLET BY MOUTH ONCE DAILY, Disp: 90 tablet, Rfl: 2 ?  naloxone (NARCAN) nasal spray 4 mg/0.1 mL, SMARTSIG:1 Spray(s) Both Nares 1 to 2 Times Daily, Disp: , Rfl:  ?  nicotine (NICODERM CQ - DOSED IN MG/24 HR) 7 mg/24hr patch, 7 mg daily., Disp: , Rfl:  ?  nitrofurantoin, macrocrystal-monohydrate, (MACROBID) 100 MG capsule, Take 100 mg by mouth 2 (two) times daily., Disp: , Rfl:  ?  omeprazole (PRILOSEC) 40 MG capsule, Take 1 capsule by mouth daily., Disp: , Rfl:  ?  oxyCODONE-acetaminophen (PERCOCET) 10-325 MG tablet, Take by mouth. Take 1 tablet by mouth in the morning and 1 tablet at noon and 1 tablet in the evening and 1 tablet before bedtime, Disp: , Rfl:  ?  prazosin (MINIPRESS) 1 MG capsule, Take 1 mg by mouth at bedtime., Disp: , Rfl:  ?  PROAIR HFA 108 (90 Base) MCG/ACT inhaler, SMARTSIG:1 Puff(s) By Mouth Every 4-6 Hours PRN, Disp: , Rfl:  ?  QUEtiapine (SEROQUEL) 400 MG tablet, Take 400 mg by mouth at bedtime., Disp: , Rfl:  ?  Semaglutide,0.25 or 0.5MG /DOS, (OZEMPIC, 0.25 OR 0.5 MG/DOSE,) 2 MG/1.5ML SOPN, Inject 0.5 mg into the skin daily., Disp: , Rfl:  ?  tiZANidine (ZANAFLEX) 4 MG tablet, Take 4 mg by mouth 3 (three) times daily as needed., Disp: , Rfl:  ?  VASCEPA 1 g capsule, Take 2 capsules (2 g total) by mouth 2 (two) times daily., Disp: 120 capsule, Rfl: 2 ?  Vitamin D, Ergocalciferol, (DRISDOL) 1.25 MG (50000 UNIT) CAPS capsule, Take 50,000 Units by mouth once a week., Disp: , Rfl:  ?  warfarin (COUMADIN) 3 MG tablet, Take 2 tablets (6 mg total) by mouth daily at 4 PM. Or as directed by the coumadin clinic (Patient taking differently: Take 6 mg by mouth daily at 4 PM. Or  as directed by the coumadin clinic  Currently on: 4.5 mg every Wed; 3 mg all other days), Disp: 180 tablet, Rfl: 2 ?Past Medical History:  ?Diagnosis Date  ? Asthma   ? Cardiomyopathy (HCC)   ? CHF (congestive heart failure) (HCC)   ? Chronic kidney disease   ? COPD (chronic obstructive pulmonary disease) (HCC)   ? Coronary artery disease   ? H/O heart valve replacement with mechanical valve   ? Heart attack (HCC)   ? Hyperlipidemia   ? Hypertension   ? Paroxysmal atrial fibrillation (HCC)   ? Stroke [facility])   ? ? ?ASSESSMENT ? ?Recent Results: ?The most recent result is correlated with 24 mg per week: ? ?Lab  Results  ?Component Value Date  ? INR 1.8 (A) 05/18/2021  ? INR 3.1 (A) 05/04/2021  ? INR 1.7 (A) 04/20/2021  ? ? ?Anticoagulation Dosing: ?Description   ?INR below goal. Start Lovenox 90 mg twice a day. Take 6 mg today and then continue current weekly dose of 4.5 mg every Wed, Fri and 3 mg all other days. Recheck INR in 5 days.  ?  ?  ?INR today: Subtherapeutic. Recent labile INR readings. INR subtherapeutic today and concerns of possible DVT. Pt seen and evaluated by Dr. Rosemary Holms and was recommended to start Lovenox and DVT US to rule out DVT/PE. Denies any other complains of bleeding or bruising symptoms. Denies any other relevant changes in diet, medications, or lifestyle changes. Reviewed with pt and pt's pharmacy to start Lovenox. Will follow up with pt's INR in 5 days.  ? ?PLAN ?Weekly dose was unchanged by 0% to 24 mg/week. Start Lovenox 90 mg BID. Take 6 mg today and then continue taking 4.5 mg every Wed and Fri and 3 mg all other days. Recheck INR in 5 days.  ? ?Patient Instructions  ?INR below goal. Start Lovenox 90 mg twice a day. Take 6 mg today and then continue current weekly dose of 4.5 mg every Wed, Fri and 3 mg all other days. Recheck INR in 5 days.  ? ?Patient advised to contact clinic or seek medical attention if signs/symptoms of bleeding or thromboembolism occur. ? ?Patient verbalized  understanding by repeating back information and was advised to contact me if further medication-related questions arise.  ? ?Follow-up ?Return in about 5 days (around 05/23/2021). ? ?Leonides Schanz, PharmD

## 2021-05-19 ENCOUNTER — Other Ambulatory Visit: Payer: Medicaid Other

## 2021-05-23 ENCOUNTER — Ambulatory Visit: Payer: Medicaid Other

## 2021-05-24 ENCOUNTER — Ambulatory Visit: Payer: Medicaid Other

## 2021-05-24 ENCOUNTER — Other Ambulatory Visit: Payer: Self-pay

## 2021-05-24 ENCOUNTER — Ambulatory Visit: Payer: Medicaid Other | Admitting: Pharmacist

## 2021-05-24 DIAGNOSIS — I480 Paroxysmal atrial fibrillation: Secondary | ICD-10-CM

## 2021-05-24 DIAGNOSIS — Z952 Presence of prosthetic heart valve: Secondary | ICD-10-CM

## 2021-05-24 DIAGNOSIS — M79604 Pain in right leg: Secondary | ICD-10-CM

## 2021-05-24 DIAGNOSIS — Z5181 Encounter for therapeutic drug level monitoring: Secondary | ICD-10-CM

## 2021-05-24 LAB — POCT INR: INR: 1.8 — AB (ref 2.0–3.0)

## 2021-05-24 NOTE — Progress Notes (Signed)
Anticoagulation Management ?Joanna Martinez is a 61 y.o. female who reports to the clinic for monitoring of warfarin treatment.   ? ?Indication: atrial fibrillation and s/p Saint Jude mechanical mitral valve replacement in March 2002  ; CHA2DS2 Vasc Score 6 (Female, CHF hx, HTN hx, Stroke/TIA hx, valvular heart disease), HAS-BLED 1 (Stroke hx)  ? ?Duration: indefinite ?Supervising physician: Rex Kras ? ?Anticoagulation Clinic Visit History: ? ?Hx of multiple previous strokes. Most recent incidence 3 or 4 years ago. Pt also on Plavix for history of coronary artery disease status post drug-eluting stent in August 2019. Noteded major DDI b/w warfarin and plavix of increased bleeding risk. Noted DDI b/w warfarin and allopurinol about increased anticoagulant effect as well. Pt denies any recent allopurinol dose changes. Pt confirmed to have a tan colored (3 mg) warfarin tablet at home. Pt started taking warfarin in the evening as previously discussed.    ? ?Patient does not report signs/symptoms of bleeding or thromboembolism.  ? ?Other recent changes: No changes in medications or lifestyle. ? ?Pt recently seen by urologist for hematuria and urinary incontinence/UTI concerns. Urinary analysis completed and pt is scheduled for follow up cystoscopy and CTU work-up. Pt cancelled and was lost to follow up. Pt had further 3-4 episode of UTI since. Most recently, pt reports that she was seen by her PCP on 3/14/2 for concerns of frequent urination, malodorous urine. Urine analysis done by PCP that was noted to be positive for UTI. Pt was started on another 7 day course of macrobid. Pt finished her course of Abx yesterday.  ? ?Pt reports that she recently increased her Vit K intake. Current intake includes Broccoli (3-4x/week), collard greens (1x), salads (2x/week) ? ?Pt continues to work on smoking cessation. Pt recently relapsed and returned to smoking 0.5-1 ppd. Pt reviewed with PCP and was started on Nicoderm patches.   ? ?Pt continues to have complains of leg pain and swelling in his left leg over the past few days. Completed ultrasound of the leg was negative. Pt has been on lovenox and has been tolerating it well. Denies any significant bleedings or bruising symptoms. DVT US negative while checked today. Pt has lost more than 17 lbs over the past 2 weeks. Pt denies any complains of n/v, diarrhea, acute blood loss, significant changes in her appetite. Pt has been drinking ensure once daily. Pt to follow up with PCP office to review the sudden weight loss concerns.  ? ?Pt with transportation limitations and thus pt wasn't able to come earlier for her ultrasound and follow up INR check.  ? ?Anticoagulation Episode Summary   ? ? Current INR goal:  2.5-3.5  ?TTR:  48.4 % (1.8 y)  ?Next INR check:  05/31/2021  ?INR from last check:  1.8 (05/24/2021)  ?Weekly max warfarin dose:    ?Target end date:  Indefinite  ?INR check location:    ?Preferred lab:    ?Send INR reminders to:    ? Indications   ?S/P mitral valve replacement [Z95.2] ?Paroxysmal atrial fibrillation (HCC) [I48.0] ?Monitoring for long-term anticoagulant use [Z51.81 ?Z79.01] ? ?  ?  ? ? Comments:    ?  ? ?  ? ? ?No Known Allergies ? ?Current Outpatient Medications:  ?  allopurinol (ZYLOPRIM) 100 MG tablet, Take by mouth., Disp: , Rfl:  ?  amoxicillin (AMOXIL) 500 MG tablet, Take 2,000 mg by mouth once. Prior to dental appt, Disp: , Rfl:  ?  atorvastatin (LIPITOR) 80 MG tablet, Take 1 tablet (  80 mg total) by mouth at bedtime., Disp: 90 tablet, Rfl: 1 ?  busPIRone (BUSPAR) 10 MG tablet, Take 1 tablet by mouth in the morning and at bedtime., Disp: , Rfl:  ?  clopidogrel (PLAVIX) 75 MG tablet, Take 1 tablet (75 mg total) by mouth daily., Disp: 90 tablet, Rfl: 2 ?  dapagliflozin propanediol (FARXIGA) 10 MG TABS tablet, TAKE ONE TABLET BY MOUTH DAILY BEFORE BREAKFAST, Disp: , Rfl:  ?  Diclofenac Sodium 3 % GEL, Apply topically., Disp: , Rfl:  ?  DULoxetine (CYMBALTA) 60 MG  capsule, Take 60 mg by mouth daily., Disp: , Rfl:  ?  enoxaparin (LOVENOX) 100 MG/ML injection, Inject 0.9 mLs (90 mg total) into the skin every 12 (twelve) hours. Discard 0.1 mL prior to each injection., Disp: 10 mL, Rfl: 1 ?  ENTRESTO 49-51 MG, TAKE ONE TABLET BY MOUTH TWICE DAILY, Disp: 60 tablet, Rfl: 5 ?  Evolocumab (REPATHA SURECLICK) [id number] MG/ML SOAJ, Inject 140 mg into the skin every 14 (fourteen) days., Disp: , Rfl:  ?  gabapentin (NEURONTIN) 600 MG tablet, Take 1 capsule by mouth 3 (three) times daily. , Disp: , Rfl:  ?  hydrOXYzine (ATARAX/VISTARIL) 25 MG tablet, Take 25 mg by mouth 2 (two) times daily as needed., Disp: , Rfl:  ?  metoprolol succinate (TOPROL-XL) 25 MG 24 hr tablet, TAKE ONE TABLET BY MOUTH ONCE DAILY, Disp: 90 tablet, Rfl: 2 ?  naloxone (NARCAN) nasal spray 4 mg/0.1 mL, SMARTSIG:1 Spray(s) Both Nares 1 to 2 Times Daily, Disp: , Rfl:  ?  nicotine (NICODERM CQ - DOSED IN MG/24 HR) 7 mg/24hr patch, 7 mg daily., Disp: , Rfl:  ?  nitrofurantoin, macrocrystal-monohydrate, (MACROBID) 100 MG capsule, Take 100 mg by mouth 2 (two) times daily., Disp: , Rfl:  ?  omeprazole (PRILOSEC) 40 MG capsule, Take 1 capsule by mouth daily., Disp: , Rfl:  ?  oxyCODONE-acetaminophen (PERCOCET) 10-325 MG tablet, Take by mouth. Take 1 tablet by mouth in the morning and 1 tablet at noon and 1 tablet in the evening and 1 tablet before bedtime, Disp: , Rfl:  ?  prazosin (MINIPRESS) 1 MG capsule, Take 1 mg by mouth at bedtime., Disp: , Rfl:  ?  PROAIR HFA 108 (90 Base) MCG/ACT inhaler, SMARTSIG:1 Puff(s) By Mouth Every 4-6 Hours PRN, Disp: , Rfl:  ?  QUEtiapine (SEROQUEL) 400 MG tablet, Take 400 mg by mouth at bedtime., Disp: , Rfl:  ?  Semaglutide,0.25 or 0.5MG /DOS, (OZEMPIC, 0.25 OR 0.5 MG/DOSE,) 2 MG/1.5ML SOPN, Inject 0.5 mg into the skin daily., Disp: , Rfl:  ?  tiZANidine (ZANAFLEX) 4 MG tablet, Take 4 mg by mouth 3 (three) times daily as needed., Disp: , Rfl:  ?  VASCEPA 1 g capsule, Take 2 capsules (2 g total)  by mouth 2 (two) times daily., Disp: 120 capsule, Rfl: 2 ?  Vitamin D, Ergocalciferol, (DRISDOL) 1.25 MG (50000 UNIT) CAPS capsule, Take 50,000 Units by mouth once a week., Disp: , Rfl:  ?  warfarin (COUMADIN) 3 MG tablet, Take 2 tablets (6 mg total) by mouth daily at 4 PM. Or as directed by the coumadin clinic (Patient taking differently: Take 6 mg by mouth daily at 4 PM. Or as directed by the coumadin clinic  Currently on: 4.5 mg every Wed; 3 mg all other days), Disp: 180 tablet, Rfl: 2 ?Past Medical History:  ?Diagnosis Date  ? Asthma   ? Cardiomyopathy ([place])   ? CHF (congestive heart failure) ([place])   ? Chronic  kidney disease   ? COPD (chronic obstructive pulmonary disease) ([place])   ? Coronary artery disease   ? H/O heart valve replacement with mechanical valve   ? Heart attack ([place])   ? Hyperlipidemia   ? Hypertension   ? Paroxysmal atrial fibrillation (HCC)   ? Stroke [facility])   ? ? ?ASSESSMENT ? ?Recent Results: ?The most recent result is correlated with 24 mg per week: ? ?Lab Results  ?Component Value Date  ? INR 1.8 (A) 05/24/2021  ? INR 1.8 (A) 05/18/2021  ? INR 3.1 (A) 05/04/2021  ? ? ?Anticoagulation Dosing: ?Description   ?INR below goal. Continue lovenox 90 mg twice a day. Take 6 mg today and then increase weekly dose to 3 mg every Tue, Thurs, Sat and 4.5 mg all other days. Recheck INR in 1 week. ?  ?  ?INR today: Subtherapeutic. INR continues to remain subtherapeutic on current weekly dose. Pt reports that she has followed the recent lovenox and warfarin dose adjustment as previously discussed. Recent increased ensure intake with known high Vit K content could be contributing to decreased anticoagulation effect. Negative for DVT per lower extremity US. Denies any other complains of bleeding or bruising symptoms. Denies any other relevant changes in diet, medications, or lifestyle changes. In setting of INR <2, will continue Lovenox and increase weekly warfarin dose.  ? ?PLAN ?Weekly dose was increased by  12.5% to 27 mg/week. Continue Lovenox 90 mg BID. Take 6 mg today and then increase weekly dose to 3 mg every Tues, Thurs, Sat, 4.5 mg all other days. Recheck INR in 1 week.  ? ?Patient Instructions  ?INR below g

## 2021-05-24 NOTE — Patient Instructions (Addendum)
INR below goal. Start Lovenox 90 mg twice a day. Take 6 mg today and then increase weekly dose to 3 mg every Tue, Thurs, Sat and 4.5 mg all other days. Recheck INR in 1 week. ?

## 2021-05-24 NOTE — Addendum Note (Signed)
Addended by: Elder Negus on: 05/24/2021 09:55 AM ? ? Modules accepted: Orders ? ?

## 2021-05-29 NOTE — Progress Notes (Signed)
Called pt no answer, could not leave a vm due that vm is full

## 2021-05-30 NOTE — Progress Notes (Signed)
Called and spoke with patient regarding her duplex results.

## 2021-05-31 ENCOUNTER — Ambulatory Visit: Payer: Medicaid Other | Admitting: Pharmacist

## 2021-05-31 ENCOUNTER — Other Ambulatory Visit: Payer: Self-pay

## 2021-05-31 DIAGNOSIS — I480 Paroxysmal atrial fibrillation: Secondary | ICD-10-CM

## 2021-05-31 DIAGNOSIS — Z5181 Encounter for therapeutic drug level monitoring: Secondary | ICD-10-CM

## 2021-05-31 DIAGNOSIS — Z952 Presence of prosthetic heart valve: Secondary | ICD-10-CM

## 2021-05-31 LAB — POCT INR: INR: 2.8 (ref 2.0–3.0)

## 2021-05-31 NOTE — Patient Instructions (Signed)
INR at goal. STOP lovenox 90 mg twice a day. Continue taking 3 mg every Tue, Thurs, Sat and 4.5 mg all other days. Recheck INR in 2 week. ?

## 2021-05-31 NOTE — Progress Notes (Signed)
Anticoagulation Management ?Joanna Martinez is a 61 y.o. female who reports to the clinic for monitoring of warfarin treatment.   ? ?Indication: atrial fibrillation and s/p Saint Jude mechanical mitral valve replacement in March 2002  ; CHA2DS2 Vasc Score 6 (Female, CHF hx, HTN hx, Stroke/TIA hx, valvular heart disease), HAS-BLED 1 (Stroke hx)  ? ?Duration: indefinite ?Supervising physician: Tessa LernerSunit Tolia ? ?Anticoagulation Clinic Visit History: ? ?Hx of multiple previous strokes. Most recent incidence 3 or 4 years ago. Pt also on Plavix for history of coronary artery disease status post drug-eluting stent in August 2019. Noteded major DDI b/w warfarin and plavix of increased bleeding risk. Noted DDI b/w warfarin and allopurinol about increased anticoagulant effect as well. Pt denies any recent allopurinol dose changes. Pt confirmed to have a tan colored (3 mg) warfarin tablet at home. Pt started taking warfarin in the evening as previously discussed.    ? ?Patient does not report signs/symptoms of bleeding or thromboembolism.  ? ?Other recent changes: No changes in medications or lifestyle. ? ?Pt recently seen by urologist for hematuria and urinary incontinence/UTI concerns. Urinary analysis completed and pt is scheduled for follow up cystoscopy and CTU work-up. Pt cancelled and was lost to follow up. Pt had further 3-4 episode of UTI since. Most recently, pt reports that she was seen by her PCP on 3/14/2 for concerns of frequent urination, malodorous urine. Urine analysis done by PCP that was noted to be positive for UTI. Pt has finished her course of macrobid last Sunday. Pt reports that she is scheduled to follow up with a urologist next week to review concerns of repeat UTI infections.  ? ?Pt reports that she recently increased her Vit K intake. Current intake includes Broccoli (3-4x/week), collard greens (1x), salads (2x/week) ? ?Pt continues to work on smoking cessation.  ? ?Pt reports that leg pain concerns has  improved and denies any further concerns of lower extremity unilateral swelling, pain.  ? ?Anticoagulation Episode Summary   ? ? Current INR goal:  2.5-3.5  ?TTR:  48.2 % (1.9 y)  ?Next INR check:  06/14/2021  ?INR from last check:  2.8 (05/31/2021)  ?Weekly max warfarin dose:    ?Target end date:  Indefinite  ?INR check location:    ?Preferred lab:    ?Send INR reminders to:    ? Indications   ?S/P mitral valve replacement [Z95.2] ?Paroxysmal atrial fibrillation (HCC) [I48.0] ?Monitoring for long-term anticoagulant use [Z51.81 ?Z79.01] ? ?  ?  ? ? Comments:    ?  ? ?  ? ? ?No Known Allergies ? ?Current Outpatient Medications:  ?  allopurinol (ZYLOPRIM) 100 MG tablet, Take by mouth., Disp: , Rfl:  ?  amoxicillin (AMOXIL) 500 MG tablet, Take 2,000 mg by mouth once. Prior to dental appt, Disp: , Rfl:  ?  atorvastatin (LIPITOR) 80 MG tablet, Take 1 tablet (80 mg total) by mouth at bedtime., Disp: 90 tablet, Rfl: 1 ?  busPIRone (BUSPAR) 10 MG tablet, Take 1 tablet by mouth in the morning and at bedtime., Disp: , Rfl:  ?  clopidogrel (PLAVIX) 75 MG tablet, Take 1 tablet (75 mg total) by mouth daily., Disp: 90 tablet, Rfl: 2 ?  dapagliflozin propanediol (FARXIGA) 10 MG TABS tablet, TAKE ONE TABLET BY MOUTH DAILY BEFORE BREAKFAST, Disp: , Rfl:  ?  Diclofenac Sodium 3 % GEL, Apply topically., Disp: , Rfl:  ?  DULoxetine (CYMBALTA) 60 MG capsule, Take 60 mg by mouth daily., Disp: , Rfl:  ?  enoxaparin (LOVENOX) 100 MG/ML injection, Inject 0.9 mLs (90 mg total) into the skin every 12 (twelve) hours. Discard 0.1 mL prior to each injection., Disp: 10 mL, Rfl: 1 ?  ENTRESTO 49-51 MG, TAKE ONE TABLET BY MOUTH TWICE DAILY, Disp: 60 tablet, Rfl: 5 ?  Evolocumab (REPATHA SURECLICK) 140 MG/ML SOAJ, Inject 140 mg into the skin every 14 (fourteen) days., Disp: , Rfl:  ?  gabapentin (NEURONTIN) 600 MG tablet, Take 1 capsule by mouth 3 (three) times daily. , Disp: , Rfl:  ?  hydrOXYzine (ATARAX/VISTARIL) 25 MG tablet, Take 25 mg by mouth 2  (two) times daily as needed., Disp: , Rfl:  ?  metoprolol succinate (TOPROL-XL) 25 MG 24 hr tablet, TAKE ONE TABLET BY MOUTH ONCE DAILY, Disp: 90 tablet, Rfl: 2 ?  naloxone (NARCAN) nasal spray 4 mg/0.1 mL, SMARTSIG:1 Spray(s) Both Nares 1 to 2 Times Daily, Disp: , Rfl:  ?  nicotine (NICODERM CQ - DOSED IN MG/24 HR) 7 mg/24hr patch, 7 mg daily., Disp: , Rfl:  ?  nitrofurantoin, macrocrystal-monohydrate, (MACROBID) 100 MG capsule, Take 100 mg by mouth 2 (two) times daily., Disp: , Rfl:  ?  omeprazole (PRILOSEC) 40 MG capsule, Take 1 capsule by mouth daily., Disp: , Rfl:  ?  oxyCODONE-acetaminophen (PERCOCET) 10-325 MG tablet, Take by mouth. Take 1 tablet by mouth in the morning and 1 tablet at noon and 1 tablet in the evening and 1 tablet before bedtime, Disp: , Rfl:  ?  prazosin (MINIPRESS) 1 MG capsule, Take 1 mg by mouth at bedtime., Disp: , Rfl:  ?  PROAIR HFA 108 (90 Base) MCG/ACT inhaler, SMARTSIG:1 Puff(s) By Mouth Every 4-6 Hours PRN, Disp: , Rfl:  ?  QUEtiapine (SEROQUEL) 400 MG tablet, Take 400 mg by mouth at bedtime., Disp: , Rfl:  ?  Semaglutide,0.25 or 0.5MG /DOS, (OZEMPIC, 0.25 OR 0.5 MG/DOSE,) 2 MG/1.5ML SOPN, Inject 0.5 mg into the skin daily., Disp: , Rfl:  ?  tiZANidine (ZANAFLEX) 4 MG tablet, Take 4 mg by mouth 3 (three) times daily as needed., Disp: , Rfl:  ?  VASCEPA 1 g capsule, Take 2 capsules (2 g total) by mouth 2 (two) times daily., Disp: 120 capsule, Rfl: 2 ?  Vitamin D, Ergocalciferol, (DRISDOL) 1.25 MG (50000 UNIT) CAPS capsule, Take 50,000 Units by mouth once a week., Disp: , Rfl:  ?  warfarin (COUMADIN) 3 MG tablet, Take 2 tablets (6 mg total) by mouth daily at 4 PM. Or as directed by the coumadin clinic (Patient taking differently: Take 6 mg by mouth daily at 4 PM. Or as directed by the coumadin clinic  Currently on: 4.5 mg every Wed; 3 mg all other days), Disp: 180 tablet, Rfl: 2 ?Past Medical History:  ?Diagnosis Date  ? Asthma   ? Cardiomyopathy (HCC)   ? CHF (congestive heart  failure) (HCC)   ? Chronic kidney disease   ? COPD (chronic obstructive pulmonary disease) (HCC)   ? Coronary artery disease   ? H/O heart valve replacement with mechanical valve   ? Heart attack (HCC)   ? Hyperlipidemia   ? Hypertension   ? Paroxysmal atrial fibrillation (HCC)   ? Stroke [facility])   ? ? ?ASSESSMENT ? ?Recent Results: ?The most recent result is correlated with 27 mg per week: ? ?Lab Results  ?Component Value Date  ? INR 2.8 05/31/2021  ? INR 1.8 (A) 05/24/2021  ? INR 1.8 (A) 05/18/2021  ? ? ?Anticoagulation Dosing: ?Description   ?INR at goal. STOP  lovenox 90 mg twice a day. Continue taking 3 mg every Tue, Thurs, Sat and 4.5 mg all other days. Recheck INR in 2 week. ?  ?  ?INR today: Therapeutic. INR improves following recent weekly dose increase. INR <2 and concerns of DVT low, thus will have pt stop Lovenox injection. Denies any other complains of bleeding or bruising symptoms. Denies any other relevant changes in diet, medications, or lifestyle changes. Will continue current weekly dose and continue close monitoring and following up in setting of recent labile INR readings.  ? ?PLAN ?Weekly dose was unchanged by 0% to 27 mg/week. Stop Lovenox 90 mg BID. Continue current weekly dose to 3 mg every Tues, Thurs, Sat, 4.5 mg all other days. Recheck INR in 1 week.  ? ?Patient Instructions  ?INR at goal. STOP lovenox 90 mg twice a day. Continue taking 3 mg every Tue, Thurs, Sat and 4.5 mg all other days. Recheck INR in 2 week. ? ?Patient advised to contact clinic or seek medical attention if signs/symptoms of bleeding or thromboembolism occur. ? ?Patient verbalized understanding by repeating back information and was advised to contact me if further medication-related questions arise.  ? ?Follow-up ?Return in about 2 weeks (around 06/14/2021). ? ?Leonides Schanz, PharmD ? ?15 minutes spent face-to-face with the patient during the encounter. 50% of time spent on education, including signs/sx bleeding and  clotting, as well as food and drug interactions with warfarin. 50% of time was spent on fingerprick POC INR sample collection,processing, results determination, and documentation ?

## 2021-06-14 ENCOUNTER — Ambulatory Visit: Payer: Medicaid Other | Admitting: Pharmacist

## 2021-06-14 DIAGNOSIS — I480 Paroxysmal atrial fibrillation: Secondary | ICD-10-CM

## 2021-06-14 DIAGNOSIS — Z5181 Encounter for therapeutic drug level monitoring: Secondary | ICD-10-CM

## 2021-06-14 DIAGNOSIS — Z952 Presence of prosthetic heart valve: Secondary | ICD-10-CM

## 2021-06-14 LAB — POCT INR: INR: 2.3 (ref 2.0–3.0)

## 2021-06-14 NOTE — Progress Notes (Signed)
Anticoagulation Management ?Joanna Martinez is a 61 y.o. female who reports to the clinic for monitoring of warfarin treatment.   ? ?Indication: atrial fibrillation and s/p Saint Jude mechanical mitral valve replacement in March 2002  ; CHA2DS2 Vasc Score 6 (Female, CHF hx, HTN hx, Stroke/TIA hx, valvular heart disease), HAS-BLED 1 (Stroke hx)  ? ?Duration: indefinite ?Supervising physician: Tessa Lerner ? ?Anticoagulation Clinic Visit History: ? ?Hx of multiple previous strokes. Most recent incidence 3 or 4 years ago. Pt also on Plavix for history of coronary artery disease status post drug-eluting stent in August 2019. Noteded major DDI b/w warfarin and plavix of increased bleeding risk. Noted DDI b/w warfarin and allopurinol about increased anticoagulant effect as well. Pt denies any recent allopurinol dose changes. Pt confirmed to have a tan colored (3 mg) warfarin tablet at home. Pt started taking warfarin in the evening as previously discussed.    ? ?Patient does not report signs/symptoms of bleeding or thromboembolism.  ? ?Other recent changes: No changes in medications or lifestyle. ? ?Pt recently seen by urologist for hematuria and urinary incontinence/UTI concerns. Urinary analysis completed and pt is scheduled for follow up cystoscopy and CTU work-up. Pt cancelled and was lost to follow up. Pt had further 3-4 episode of UTI since. Most recently, pt reports that she was seen by her PCP on 3/14/2 for concerns of frequent urination, malodorous urine. Urine analysis done by PCP that was noted to be positive for UTI. Pt has finished her course of macrobid last Sunday. Pt reports that she is scheduled to follow up with a urologist next month to review concerns of repeat UTI infections.  ? ?Pt reports that she recently increased her Vit K intake. Current intake includes Broccoli (3-4x/week), collard greens (1x), salads (2x/week) ? ?Pt continues to work on smoking cessation. Currently smoking 1/2 ppd. Never started  on her nicotine patches, but willing to start moving forward. Nicorette gums not covered through pt's insurance and pt planing on purchasing it OTC.  ? ?Anticoagulation Episode Summary   ? ? Current INR goal:  2.5-3.5  ?TTR:  48.5 % (1.9 y)  ?Next INR check:  06/28/2021  ?INR from last check:  2.3 (06/14/2021)  ?Weekly max warfarin dose:    ?Target end date:  Indefinite  ?INR check location:    ?Preferred lab:    ?Send INR reminders to:    ? Indications   ?S/P mitral valve replacement [Z95.2] ?Paroxysmal atrial fibrillation (HCC) [I48.0] ?Monitoring for long-term anticoagulant use [Z51.81 ?Z79.01] ? ?  ?  ? ? Comments:    ?  ? ?  ? ? ?No Known Allergies ? ?Current Outpatient Medications:  ?  allopurinol (ZYLOPRIM) 100 MG tablet, Take by mouth., Disp: , Rfl:  ?  amoxicillin (AMOXIL) 500 MG tablet, Take 2,000 mg by mouth once. Prior to dental appt, Disp: , Rfl:  ?  atorvastatin (LIPITOR) 80 MG tablet, Take 1 tablet (80 mg total) by mouth at bedtime., Disp: 90 tablet, Rfl: 1 ?  busPIRone (BUSPAR) 10 MG tablet, Take 1 tablet by mouth in the morning and at bedtime., Disp: , Rfl:  ?  clopidogrel (PLAVIX) 75 MG tablet, Take 1 tablet (75 mg total) by mouth daily., Disp: 90 tablet, Rfl: 2 ?  dapagliflozin propanediol (FARXIGA) 10 MG TABS tablet, TAKE ONE TABLET BY MOUTH DAILY BEFORE BREAKFAST, Disp: , Rfl:  ?  Diclofenac Sodium 3 % GEL, Apply topically., Disp: , Rfl:  ?  DULoxetine (CYMBALTA) 60 MG capsule, Take 60  mg by mouth daily., Disp: , Rfl:  ?  enoxaparin (LOVENOX) 100 MG/ML injection, Inject 0.9 mLs (90 mg total) into the skin every 12 (twelve) hours. Discard 0.1 mL prior to each injection., Disp: 10 mL, Rfl: 1 ?  ENTRESTO 49-51 MG, TAKE ONE TABLET BY MOUTH TWICE DAILY, Disp: 60 tablet, Rfl: 5 ?  Evolocumab (REPATHA SURECLICK) 140 MG/ML SOAJ, Inject 140 mg into the skin every 14 (fourteen) days., Disp: , Rfl:  ?  gabapentin (NEURONTIN) 600 MG tablet, Take 1 capsule by mouth 3 (three) times daily. , Disp: , Rfl:  ?   hydrOXYzine (ATARAX/VISTARIL) 25 MG tablet, Take 25 mg by mouth 2 (two) times daily as needed., Disp: , Rfl:  ?  metoprolol succinate (TOPROL-XL) 25 MG 24 hr tablet, TAKE ONE TABLET BY MOUTH ONCE DAILY, Disp: 90 tablet, Rfl: 2 ?  naloxone (NARCAN) nasal spray 4 mg/0.1 mL, SMARTSIG:1 Spray(s) Both Nares 1 to 2 Times Daily, Disp: , Rfl:  ?  nicotine (NICODERM CQ - DOSED IN MG/24 HR) 7 mg/24hr patch, 7 mg daily., Disp: , Rfl:  ?  nitrofurantoin, macrocrystal-monohydrate, (MACROBID) 100 MG capsule, Take 100 mg by mouth 2 (two) times daily., Disp: , Rfl:  ?  omeprazole (PRILOSEC) 40 MG capsule, Take 1 capsule by mouth daily., Disp: , Rfl:  ?  oxyCODONE-acetaminophen (PERCOCET) 10-325 MG tablet, Take by mouth. Take 1 tablet by mouth in the morning and 1 tablet at noon and 1 tablet in the evening and 1 tablet before bedtime, Disp: , Rfl:  ?  prazosin (MINIPRESS) 1 MG capsule, Take 1 mg by mouth at bedtime., Disp: , Rfl:  ?  PROAIR HFA 108 (90 Base) MCG/ACT inhaler, SMARTSIG:1 Puff(s) By Mouth Every 4-6 Hours PRN, Disp: , Rfl:  ?  QUEtiapine (SEROQUEL) 400 MG tablet, Take 400 mg by mouth at bedtime., Disp: , Rfl:  ?  Semaglutide,0.25 or 0.5MG /DOS, (OZEMPIC, 0.25 OR 0.5 MG/DOSE,) 2 MG/1.5ML SOPN, Inject 0.5 mg into the skin daily., Disp: , Rfl:  ?  tiZANidine (ZANAFLEX) 4 MG tablet, Take 4 mg by mouth 3 (three) times daily as needed., Disp: , Rfl:  ?  VASCEPA 1 g capsule, Take 2 capsules (2 g total) by mouth 2 (two) times daily., Disp: 120 capsule, Rfl: 2 ?  Vitamin D, Ergocalciferol, (DRISDOL) 1.25 MG (50000 UNIT) CAPS capsule, Take 50,000 Units by mouth once a week., Disp: , Rfl:  ?  warfarin (COUMADIN) 3 MG tablet, Take 2 tablets (6 mg total) by mouth daily at 4 PM. Or as directed by the coumadin clinic (Patient taking differently: Take 6 mg by mouth daily at 4 PM. Or as directed by the coumadin clinic  Currently on: 4.5 mg every Wed; 3 mg all other days), Disp: 180 tablet, Rfl: 2 ?Past Medical History:  ?Diagnosis Date   ? Asthma   ? Cardiomyopathy (HCC)   ? CHF (congestive heart failure) (HCC)   ? Chronic kidney disease   ? COPD (chronic obstructive pulmonary disease) (HCC)   ? Coronary artery disease   ? H/O heart valve replacement with mechanical valve   ? Heart attack (HCC)   ? Hyperlipidemia   ? Hypertension   ? Paroxysmal atrial fibrillation (HCC)   ? Stroke [facility])   ? ? ?ASSESSMENT ? ?Recent Results: ?The most recent result is correlated with 27 mg per week: ? ?Lab Results  ?Component Value Date  ? INR 2.3 06/14/2021  ? INR 2.8 05/31/2021  ? INR 1.8 (A) 05/24/2021  ? ? ?  Anticoagulation Dosing: ?Description   ?INR below goal. Increase weekly dose to 3 mg every Tues, and 4.5 mg all other days. Recheck INR in 2 weeks.  ?  ?  ?INR today: Subtherapeutic. Recent labile INR. INR continues to be subtherapeutic in setting of pt's variable Vit K intake. Pt aware to staying consistent with her Vit K intake moving forward. Denies any other complains of bleeding or bruising symptoms. Denies any other relevant changes in diet, medications, or lifestyle changes. Will increase weekly warfarin intake to ensure INR returns back within therapeutic range.  ? ?PLAN ?Weekly dose was increased by 11.1% to 30 mg/week. Increase weekly dose to 3 mg every Tues, 4.5 mg all other days. Recheck INR in 1 week.  ? ?Patient Instructions  ?INR below goal. Increase weekly dose to 3 mg every Tues, and 4.5 mg all other days. Recheck INR in 2 weeks.  ? ?Patient advised to contact clinic or seek medical attention if signs/symptoms of bleeding or thromboembolism occur. ? ?Patient verbalized understanding by repeating back information and was advised to contact me if further medication-related questions arise.  ? ?Follow-up ?Return in about 2 weeks (around 06/28/2021). ? ?Leonides Schanzeny T Jachin Coury, PharmD ? ?15 minutes spent face-to-face with the patient during the encounter. 50% of time spent on education, including signs/sx bleeding and clotting, as well as food and drug  interactions with warfarin. 50% of time was spent on fingerprick POC INR sample collection,processing, results determination, and documentation ?

## 2021-06-14 NOTE — Patient Instructions (Signed)
INR below goal. Increase weekly dose to 3 mg every Tues, and 4.5 mg all other days. Recheck INR in 2 weeks.  ?

## 2021-06-28 IMAGING — CT CT HEAD W/O CM
4 series · 16 of 47 positions shown, 18 images · non-contrast
Comparison: None.

CLINICAL DATA: Lethargy, dizziness, occipital headache and blurred
vision following a fall and hitting the back of her head on a coffee
table four days ago.

EXAM:
CT HEAD WITHOUT CONTRAST
TECHNIQUE: Contiguous axial images were obtained from the base of the skull
through the vertex without intravenous contrast.

[Series 3: head without · axial · non-contrast · 0.46mm/px · z∈[+303,+423]mm · 7 of 32 slices shown, 9 images]
[im 4/32  brain]
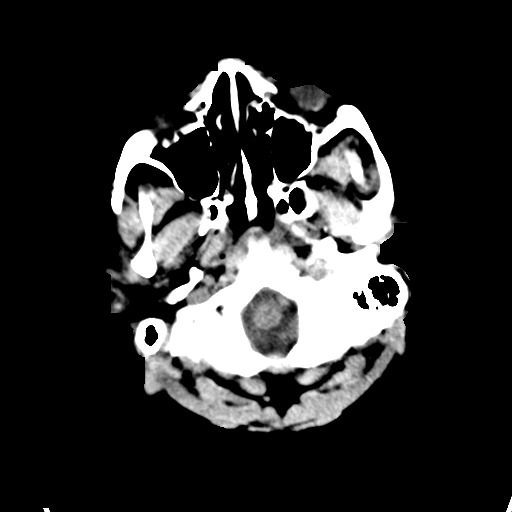
[im 4/32  bone]
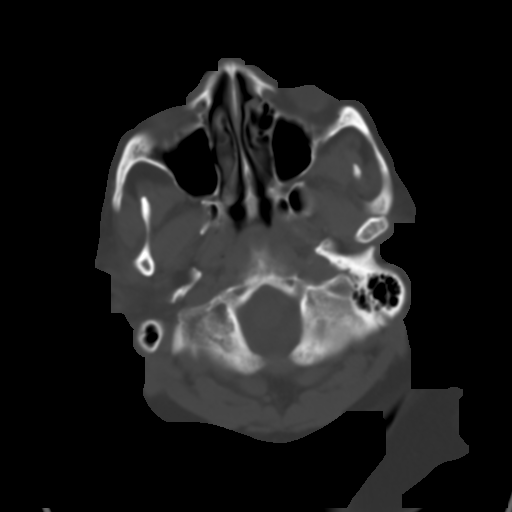
[im 8/32  brain]
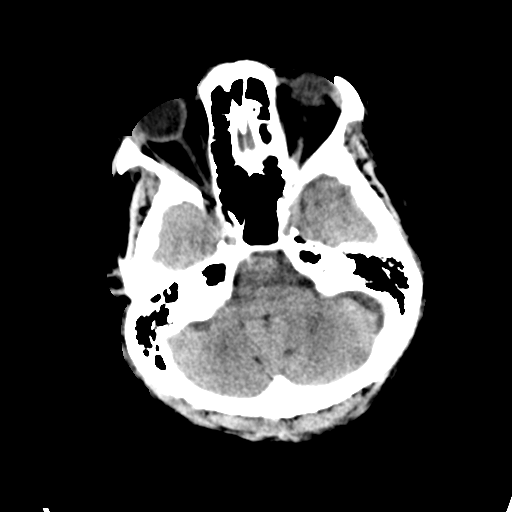
[im 12/32  brain]
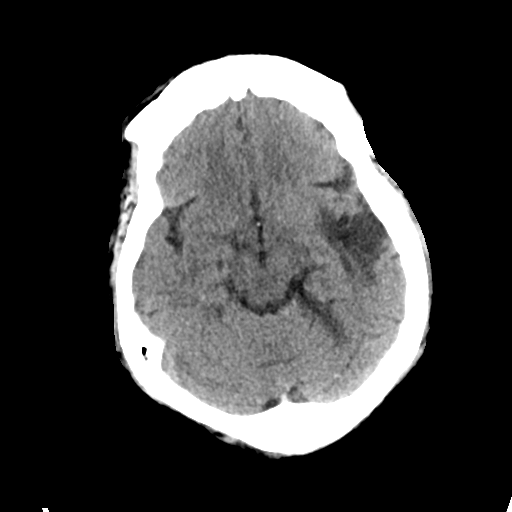
[im 16/32  brain]
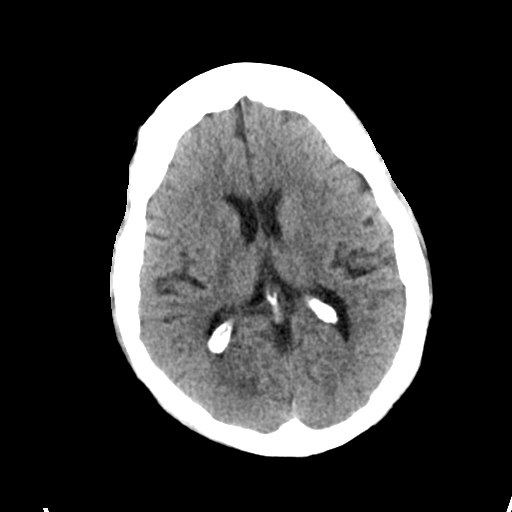
[im 20/32  brain]
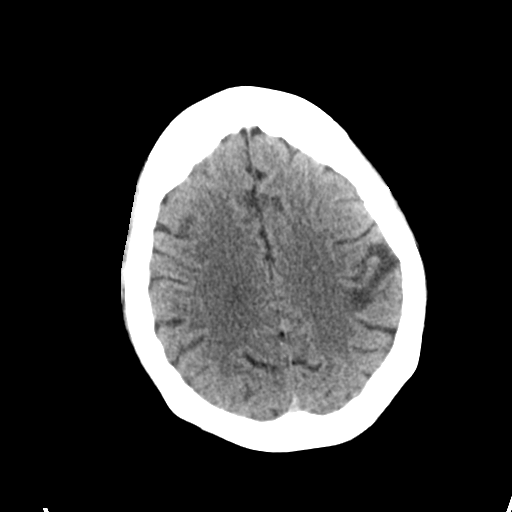
[im 20/32  bone]
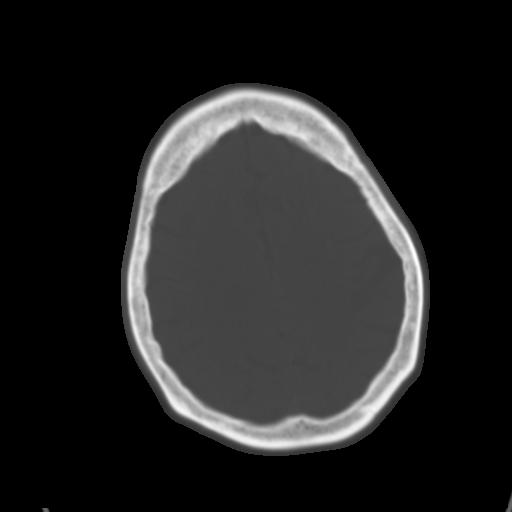
[im 24/32  brain]
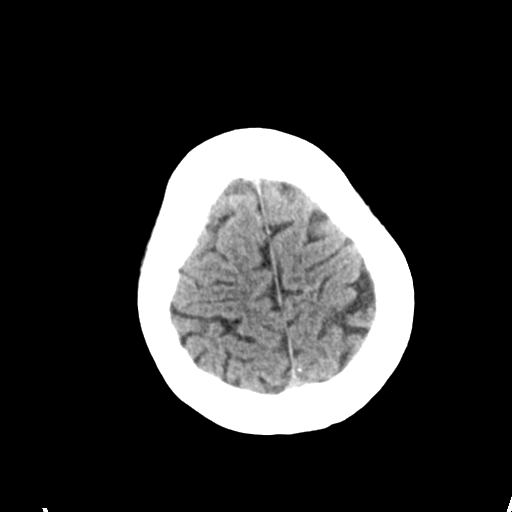
[im 28/32  brain]
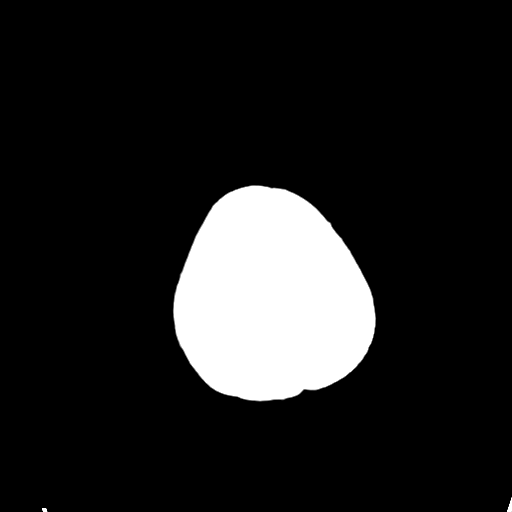

[Series 4: head bone · axial · 0.46mm/px · z∈[+302,+334]mm · 3 of 78 slices shown]
[im 8/78  bone]
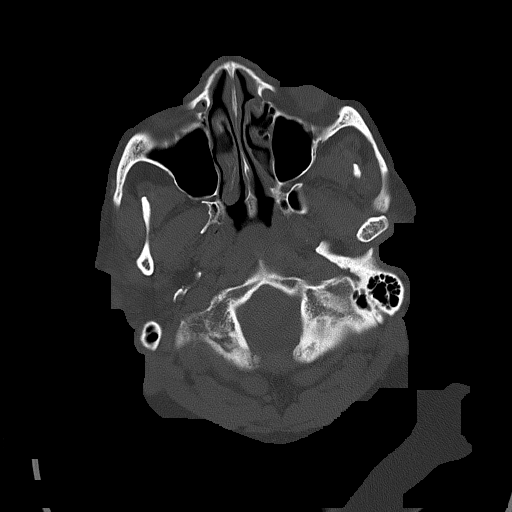
[im 16/78  bone]
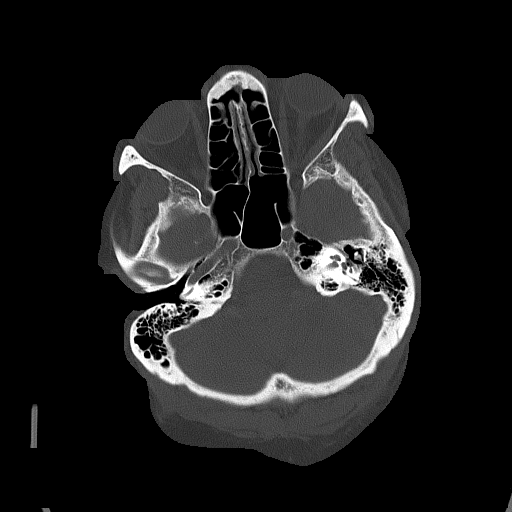
[im 24/78  bone]
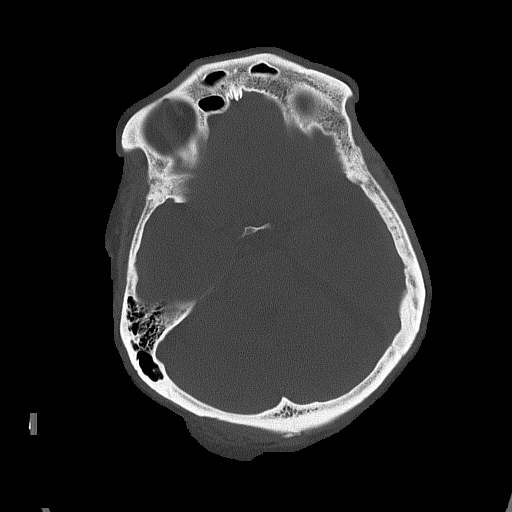

[Series 5: head without cor · coronal · non-contrast · 0.32mm/px · 3 of 77 slices shown]
[im 26/77  brain]
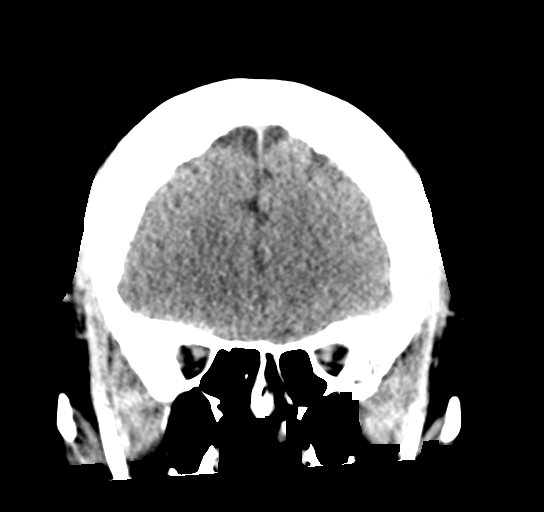
[im 34/77  brain]
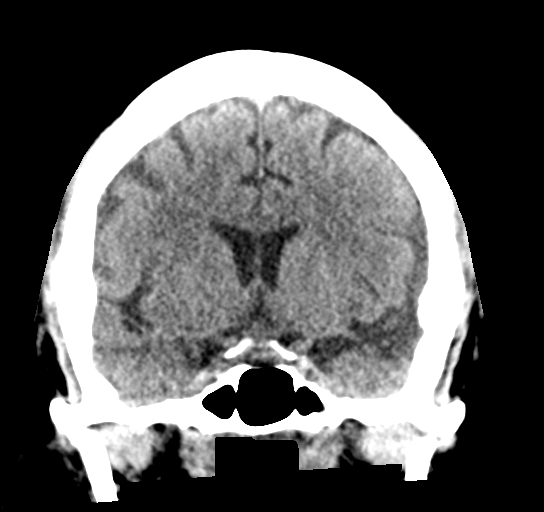
[im 43/77  brain]
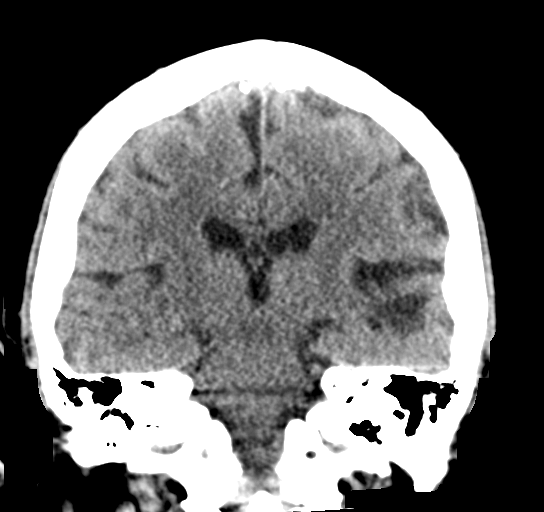

[Series 6: head without sag · sagittal · non-contrast · 0.33mm/px · 3 of 67 slices shown]
[im 23/67  brain]
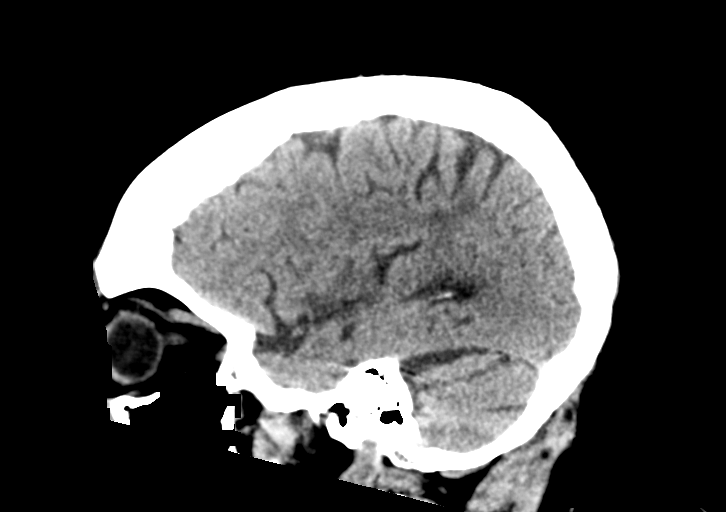
[im 34/67  brain]
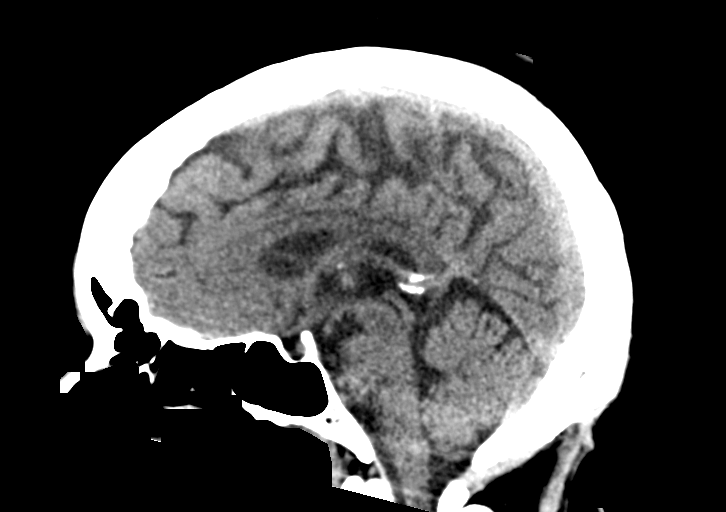
[im 45/67  brain]
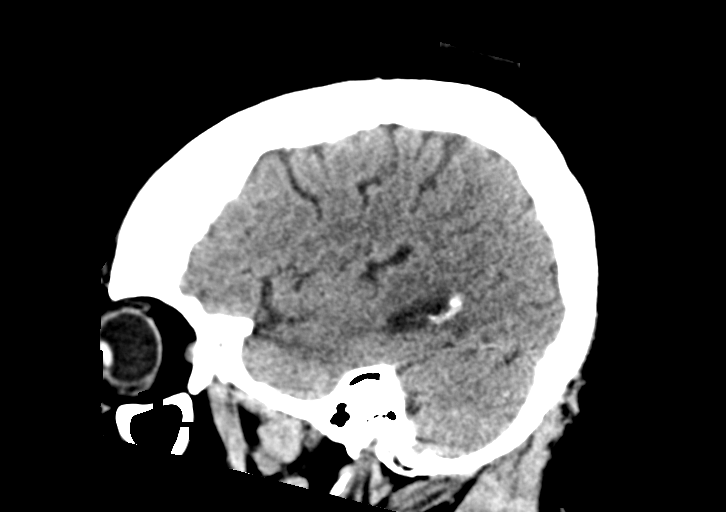

[16 of 47 positions shown; findings below may reference images not displayed]

FINDINGS: Brain: Left temporal lobe encephalomalacia and wedge-shaped area of
encephalomalacia in the left parietal lobe. Mildly enlarged
ventricles and cortical sulci. No intracranial hemorrhage, mass
lesion or CT evidence of acute infarction.

Vascular: No hyperdense vessel or unexpected calcification.

Skull: Normal. Negative for fracture or focal lesion.

Sinuses/Orbits: Unremarkable.

Other: Left parietal fat density scalp lesion.
IMPRESSION: 1. No acute abnormality.
2. Left temporal and parietal lobe encephalomalacia, compatible with
old infarcts.
3. Mild diffuse cerebral and cerebellar atrophy.

## 2021-07-05 ENCOUNTER — Ambulatory Visit: Payer: Medicaid Other | Admitting: Pharmacist

## 2021-07-05 DIAGNOSIS — Z5181 Encounter for therapeutic drug level monitoring: Secondary | ICD-10-CM

## 2021-07-05 DIAGNOSIS — Z952 Presence of prosthetic heart valve: Secondary | ICD-10-CM

## 2021-07-05 DIAGNOSIS — I480 Paroxysmal atrial fibrillation: Secondary | ICD-10-CM

## 2021-07-05 LAB — POCT INR: INR: 3.1 — AB (ref 2.0–3.0)

## 2021-07-05 NOTE — Patient Instructions (Signed)
INR at goal. Continue current weekly dose of 3 mg every Tues, and 4.5 mg all other days. Recheck INR in 2 weeks.  ?

## 2021-07-05 NOTE — Progress Notes (Signed)
Anticoagulation Management ?Joanna Martinez is a 61 y.o. female who reports to the clinic for monitoring of warfarin treatment.   ? ?Indication: atrial fibrillation and s/p Saint Jude mechanical mitral valve replacement in March 2002  ; CHA2DS2 Vasc Score 6 (Female, CHF hx, HTN hx, Stroke/TIA hx, valvular heart disease), HAS-BLED 1 (Stroke hx)  ? ?Duration: indefinite ?Supervising physician: Rex Kras ? ?Anticoagulation Clinic Visit History: ? ?Hx of multiple previous strokes. Most recent incidence 3 or 4 years ago. Pt also on Plavix for history of coronary artery disease status post drug-eluting stent in August 2019. Noteded major DDI b/w warfarin and plavix of increased bleeding risk. Noted DDI b/w warfarin and allopurinol about increased anticoagulant effect as well. Pt denies any recent allopurinol dose changes. Pt confirmed to have a tan colored (3 mg) warfarin tablet at home. Pt started taking warfarin in the evening as previously discussed.    ? ?Patient does not report signs/symptoms of bleeding or thromboembolism.  ? ?Other recent changes: No changes in medications or lifestyle. ? ?Pt being followed by urologist for hematuria and urinary incontinence/UTI concerns. Urinary analysis completed and pt is scheduled for follow up cystoscopy and CTU work-up. Pt cancelled and was lost to follow up. Pt had further 3-4 episode of UTI since. Pt reports that she is scheduled to follow up with a urologist next week to review concerns of repeat UTI infections.  ? ?Pt staying consistent with her Vit K intake. Current intake includes Broccoli (3-4x/week), collard greens (1x), salads (2x/week) ? ?Pt continues to work on smoking cessation. Currently smoking 1/2 ppd. Never started on her nicotine patches, but willing to start moving forward. Pt concerned of potential adverse reaction with using the nicotine patch and having a cigarette at the same time. Provided pt with smoking cessation counsleing and pt willing to start  using her nicotine patches and decreasing her daily cigarette intake. Nicorette gums not covered through pt's insurance and pt planing on purchasing it OTC.  ? ?Anticoagulation Episode Summary   ? ? Current INR goal:  2.5-3.5  ?TTR:  49.2 % (2 y)  ?Next INR check:  07/18/2021  ?INR from last check:  3.1 (07/05/2021)  ?Weekly max warfarin dose:    ?Target end date:  Indefinite  ?INR check location:    ?Preferred lab:    ?Send INR reminders to:    ? Indications   ?S/P mitral valve replacement [Z95.2] ?Paroxysmal atrial fibrillation (HCC) [I48.0] ?Monitoring for long-term anticoagulant use [Z51.81 ?Z79.01] ? ?  ?  ? ? Comments:    ?  ? ?  ? ? ?No Known Allergies ? ?Current Outpatient Medications:  ?  allopurinol (ZYLOPRIM) 100 MG tablet, Take by mouth., Disp: , Rfl:  ?  amoxicillin (AMOXIL) 500 MG tablet, Take 2,000 mg by mouth once. Prior to dental appt, Disp: , Rfl:  ?  atorvastatin (LIPITOR) 80 MG tablet, Take 1 tablet (80 mg total) by mouth at bedtime., Disp: 90 tablet, Rfl: 1 ?  busPIRone (BUSPAR) 10 MG tablet, Take 1 tablet by mouth in the morning and at bedtime., Disp: , Rfl:  ?  clopidogrel (PLAVIX) 75 MG tablet, Take 1 tablet (75 mg total) by mouth daily., Disp: 90 tablet, Rfl: 2 ?  dapagliflozin propanediol (FARXIGA) 10 MG TABS tablet, TAKE ONE TABLET BY MOUTH DAILY BEFORE BREAKFAST, Disp: , Rfl:  ?  Diclofenac Sodium 3 % GEL, Apply topically., Disp: , Rfl:  ?  DULoxetine (CYMBALTA) 60 MG capsule, Take 60 mg by mouth daily.,  Disp: , Rfl:  ?  enoxaparin (LOVENOX) 100 MG/ML injection, Inject 0.9 mLs (90 mg total) into the skin every 12 (twelve) hours. Discard 0.1 mL prior to each injection., Disp: 10 mL, Rfl: 1 ?  ENTRESTO 49-51 MG, TAKE ONE TABLET BY MOUTH TWICE DAILY, Disp: 60 tablet, Rfl: 5 ?  Evolocumab (REPATHA SURECLICK) [id number] MG/ML SOAJ, Inject 140 mg into the skin every 14 (fourteen) days., Disp: , Rfl:  ?  gabapentin (NEURONTIN) 600 MG tablet, Take 1 capsule by mouth 3 (three) times daily. , Disp: , Rfl:  ?   hydrOXYzine (ATARAX/VISTARIL) 25 MG tablet, Take 25 mg by mouth 2 (two) times daily as needed., Disp: , Rfl:  ?  metoprolol succinate (TOPROL-XL) 25 MG 24 hr tablet, TAKE ONE TABLET BY MOUTH ONCE DAILY, Disp: 90 tablet, Rfl: 2 ?  naloxone (NARCAN) nasal spray 4 mg/0.1 mL, SMARTSIG:1 Spray(s) Both Nares 1 to 2 Times Daily, Disp: , Rfl:  ?  nicotine (NICODERM CQ - DOSED IN MG/24 HR) 7 mg/24hr patch, 7 mg daily., Disp: , Rfl:  ?  nitrofurantoin, macrocrystal-monohydrate, (MACROBID) 100 MG capsule, Take 100 mg by mouth 2 (two) times daily., Disp: , Rfl:  ?  omeprazole (PRILOSEC) 40 MG capsule, Take 1 capsule by mouth daily., Disp: , Rfl:  ?  oxyCODONE-acetaminophen (PERCOCET) 10-325 MG tablet, Take by mouth. Take 1 tablet by mouth in the morning and 1 tablet at noon and 1 tablet in the evening and 1 tablet before bedtime, Disp: , Rfl:  ?  prazosin (MINIPRESS) 1 MG capsule, Take 1 mg by mouth at bedtime., Disp: , Rfl:  ?  PROAIR HFA 108 (90 Base) MCG/ACT inhaler, SMARTSIG:1 Puff(s) By Mouth Every 4-6 Hours PRN, Disp: , Rfl:  ?  QUEtiapine (SEROQUEL) 400 MG tablet, Take 400 mg by mouth at bedtime., Disp: , Rfl:  ?  Semaglutide,0.25 or 0.5MG /DOS, (OZEMPIC, 0.25 OR 0.5 MG/DOSE,) 2 MG/1.5ML SOPN, Inject 0.5 mg into the skin daily., Disp: , Rfl:  ?  tiZANidine (ZANAFLEX) 4 MG tablet, Take 4 mg by mouth 3 (three) times daily as needed., Disp: , Rfl:  ?  VASCEPA 1 g capsule, Take 2 capsules (2 g total) by mouth 2 (two) times daily., Disp: 120 capsule, Rfl: 2 ?  Vitamin D, Ergocalciferol, (DRISDOL) 1.25 MG (50000 UNIT) CAPS capsule, Take 50,000 Units by mouth once a week., Disp: , Rfl:  ?  warfarin (COUMADIN) 3 MG tablet, Take 2 tablets (6 mg total) by mouth daily at 4 PM. Or as directed by the coumadin clinic (Patient taking differently: Take 6 mg by mouth daily at 4 PM. Or as directed by the coumadin clinic  Currently on: 4.5 mg every Wed; 3 mg all other days), Disp: 180 tablet, Rfl: 2 ?Past Medical History:  ?Diagnosis Date   ? Asthma   ? Cardiomyopathy ([place])   ? CHF (congestive heart failure) ([place])   ? Chronic kidney disease   ? COPD (chronic obstructive pulmonary disease) ([place])   ? Coronary artery disease   ? H/O heart valve replacement with mechanical valve   ? Heart attack ([place])   ? Hyperlipidemia   ? Hypertension   ? Paroxysmal atrial fibrillation (HCC)   ? Stroke [facility])   ? ? ?ASSESSMENT ? ?Recent Results: ?The most recent result is correlated with 30 mg per week: ? ?Lab Results  ?Component Value Date  ? INR 3.1 (A) 07/05/2021  ? INR 2.3 06/14/2021  ? INR 2.8 05/31/2021  ? ? ?Anticoagulation Dosing: ?Description   ?  INR at goal. Continue current weekly dose of 3 mg every Tues, and 4.5 mg all other days. Recheck INR in 2 weeks.  ?  ?  ?INR today: Therapeutic. INR improved following recent weekly dose increase. Pt aware to staying consistent with her Vit K intake. Denies any other complains of bleeding or bruising symptoms. Denies any other relevant changes in diet, medications, or lifestyle changes. Will continue current weekly warfarin intake and continue close monitoring to ensure INR remain therapeutic. Marland Kitchen  ? ?PLAN ?Weekly dose was unchanged by 0% to 30 mg/week. Continue current weekly dose of 3 mg every Tues, 4.5 mg all other days. Recheck INR in 2 week.  ? ?Patient Instructions  ?INR at goal. Continue current weekly dose of 3 mg every Tues, and 4.5 mg all other days. Recheck INR in 2 weeks.  ? ?Patient advised to contact clinic or seek medical attention if signs/symptoms of bleeding or thromboembolism occur. ? ?Patient verbalized understanding by repeating back information and was advised to contact me if further medication-related questions arise.  ? ?Follow-up ?Return in about 13 days (around 07/18/2021). ? ?[name], PharmD ? ?15 minutes spent face-to-face with the patient during the encounter. 50% of time spent on education, including signs/sx bleeding and clotting, as well as food and drug interactions with warfarin. 50%  of time was spent on fingerprick POC INR sample collection,processing, results determination, and documentation ?

## 2021-07-11 ENCOUNTER — Other Ambulatory Visit: Payer: Self-pay | Admitting: Cardiology

## 2021-07-11 DIAGNOSIS — I480 Paroxysmal atrial fibrillation: Secondary | ICD-10-CM

## 2021-07-11 DIAGNOSIS — Z7901 Long term (current) use of anticoagulants: Secondary | ICD-10-CM

## 2021-07-18 ENCOUNTER — Ambulatory Visit: Payer: Medicaid Other | Admitting: Pharmacist

## 2021-07-18 DIAGNOSIS — Z952 Presence of prosthetic heart valve: Secondary | ICD-10-CM

## 2021-07-18 DIAGNOSIS — I480 Paroxysmal atrial fibrillation: Secondary | ICD-10-CM

## 2021-07-18 DIAGNOSIS — Z7901 Long term (current) use of anticoagulants: Secondary | ICD-10-CM

## 2021-07-18 LAB — POCT INR: INR: 2.9 (ref 2.0–3.0)

## 2021-07-18 NOTE — Patient Instructions (Signed)
INR at goal. Continue current weekly dose of 3 mg every Tues, and 4.5 mg all other days. Recheck INR in 3 weeks.  ?

## 2021-07-18 NOTE — Progress Notes (Signed)
Anticoagulation Management Joanna Martinez is a 61 y.o. female who reports to the clinic for monitoring of warfarin treatment.    Indication: atrial fibrillation and s/p [employer] Jude mechanical mitral valve replacement in March 2002  ; CHA2DS2 Vasc Score 6 (Female, CHF hx, HTN hx, Stroke/TIA hx, valvular heart disease), HAS-BLED 1 (Stroke hx)   Duration: indefinite Supervising physician: Tessa LernerSunit Tolia  Anticoagulation Clinic Visit History:  Hx of multiple previous strokes. Most recent incidence 3 or 4 years ago. Pt also on Plavix for history of coronary artery disease status post drug-eluting stent in August 2019. Noteded major DDI b/w warfarin and plavix of increased bleeding risk. Noted DDI b/w warfarin and allopurinol about increased anticoagulant effect as well. Pt denies any recent allopurinol dose changes. Pt confirmed to have a tan colored (3 mg) warfarin tablet at home. Pt started taking warfarin in the evening as previously discussed.     Patient does not report signs/symptoms of bleeding or thromboembolism.   Other recent changes: No changes in medications or lifestyle.  Pt being followed by urologist for hematuria and urinary incontinence/UTI concerns. Urinary analysis completed and pt is scheduled for follow up cystoscopy and CTU work-up. Pt cancelled and was lost to follow up. Pt had further 3-4 episode of UTI since. Pt reports that she is scheduled to follow up with a urologist next week to review concerns of repeat UTI infections.   Pt staying consistent with her Vit K intake. Current intake includes Broccoli (3-4x/week), collard greens (1x), salads (2x/week)  Pt continues to work on smoking cessation. Currently smoking 1/2 ppd. Never started on her nicotine patches, but willing to start moving forward. Pt concerned of potential adverse reaction with using the nicotine patch and having a cigarette at the same time. Provided pt with smoking cessation counsleing and pt willing to start  using her nicotine patches and decreasing her daily cigarette intake. Nicorette gums not covered through pt's insurance and pt planing on purchasing it OTC.   Pt reports that she recently had labs completed through PCP office. Requested labs to be faxed over. Med list reviewed and updated.   Anticoagulation Episode Summary     Current INR goal:  2.5-3.5  TTR:  50.1 % (2 y)  Next INR check:  08/07/2021  INR from last check:  2.9 (07/18/2021)  Weekly max warfarin dose:    Target end date:  Indefinite  INR check location:    Preferred lab:    Send INR reminders to:     Indications   S/P mitral valve replacement [Z95.2] Paroxysmal atrial fibrillation (HCC) [I48.0] Monitoring for long-term anticoagulant use [Z51.81 Z79.01]        Comments:          No Known Allergies  Current Outpatient Medications:    allopurinol (ZYLOPRIM) 100 MG tablet, Take by mouth., Disp: , Rfl:    amoxicillin (AMOXIL) 500 MG tablet, Take 2,000 mg by mouth once. Prior to dental appt, Disp: , Rfl:    atorvastatin (LIPITOR) 80 MG tablet, Take 1 tablet (80 mg total) by mouth at bedtime., Disp: 90 tablet, Rfl: 1   busPIRone (BUSPAR) 10 MG tablet, Take 1 tablet by mouth in the morning and at bedtime., Disp: , Rfl:    clopidogrel (PLAVIX) 75 MG tablet, Take 1 tablet (75 mg total) by mouth daily., Disp: 90 tablet, Rfl: 2   dapagliflozin propanediol (FARXIGA) 10 MG TABS tablet, TAKE ONE TABLET BY MOUTH DAILY BEFORE BREAKFAST, Disp: , Rfl:    Diclofenac  Sodium 3 % GEL, Apply topically., Disp: , Rfl:    DULoxetine (CYMBALTA) 60 MG capsule, Take 60 mg by mouth daily., Disp: , Rfl:    ENTRESTO 49-51 MG, TAKE ONE TABLET BY MOUTH TWICE DAILY, Disp: 60 tablet, Rfl: 5   Evolocumab (REPATHA SURECLICK) 140 MG/ML SOAJ, Inject 140 mg into the skin every 14 (fourteen) days., Disp: , Rfl:    gabapentin (NEURONTIN) 600 MG tablet, Take 1 capsule by mouth 3 (three) times daily. , Disp: , Rfl:    hydrOXYzine (ATARAX/VISTARIL) 25 MG  tablet, Take 25 mg by mouth 2 (two) times daily as needed., Disp: , Rfl:    metoprolol succinate (TOPROL-XL) 25 MG 24 hr tablet, TAKE ONE TABLET BY MOUTH ONCE DAILY, Disp: 90 tablet, Rfl: 2   naloxone (NARCAN) nasal spray 4 mg/0.1 mL, SMARTSIG:1 Spray(s) Both Nares 1 to 2 Times Daily, Disp: , Rfl:    nicotine (NICODERM CQ - DOSED IN MG/24 HR) 7 mg/24hr patch, 7 mg daily., Disp: , Rfl:    omeprazole (PRILOSEC) 40 MG capsule, Take 1 capsule by mouth daily., Disp: , Rfl:    oxyCODONE-acetaminophen (PERCOCET) 10-325 MG tablet, Take by mouth. Take 1 tablet by mouth in the morning and 1 tablet at noon and 1 tablet in the evening and 1 tablet before bedtime, Disp: , Rfl:    prazosin (MINIPRESS) 1 MG capsule, Take 1 mg by mouth at bedtime., Disp: , Rfl:    PROAIR HFA 108 (90 Base) MCG/ACT inhaler, SMARTSIG:1 Puff(s) By Mouth Every 4-6 Hours PRN, Disp: , Rfl:    QUEtiapine (SEROQUEL) 400 MG tablet, Take 400 mg by mouth at bedtime., Disp: , Rfl:    Semaglutide,0.25 or 0.5MG /DOS, (OZEMPIC, 0.25 OR 0.5 MG/DOSE,) 2 MG/1.5ML SOPN, Inject 0.5 mg into the skin once a week., Disp: , Rfl:    tiZANidine (ZANAFLEX) 4 MG tablet, Take 4 mg by mouth 3 (three) times daily as needed., Disp: , Rfl:    VASCEPA 1 g capsule, Take 2 capsules (2 g total) by mouth 2 (two) times daily., Disp: 120 capsule, Rfl: 2   warfarin (COUMADIN) 3 MG tablet, TAKE TWO TABLETS BY MOUTH each DAY AT 4pm OR AS DIRECTED by coumadin clinic, Disp: 480 tablet, Rfl: 0   Accu-Chek FastClix Lancets MISC, Apply topically daily., Disp: , Rfl:    ACCU-CHEK GUIDE test strip, daily. as directed, Disp: , Rfl:  Past Medical History:  Diagnosis Date   Asthma    Cardiomyopathy (HCC)    CHF (congestive heart failure) (HCC)    Chronic kidney disease    COPD (chronic obstructive pulmonary disease) (HCC)    Coronary artery disease    H/O heart valve replacement with mechanical valve    Heart attack (HCC)    Hyperlipidemia    Hypertension    Paroxysmal atrial  fibrillation (HCC)    Stroke [facility])     ASSESSMENT  Recent Results: The most recent result is correlated with 30 mg per week:  Lab Results  Component Value Date   INR 2.9 07/18/2021   INR 3.1 (A) 07/05/2021   INR 2.3 06/14/2021    Anticoagulation Dosing: Description   INR at goal. Continue current weekly dose of 3 mg every Tues, and 4.5 mg all other days. Recheck INR in 3 weeks.      INR today: Therapeutic. INR continues to remain therapeutic on current weekly dose. Pt aware to staying consistent with her Vit K intake. Denies any other complains of bleeding or bruising symptoms. Denies  any other relevant changes in diet, medications, or lifestyle changes. Will continue current weekly warfarin intake and continue close monitoring to ensure INR remain therapeutic. Marland Kitchen   PLAN Weekly dose was unchanged by 0% to 30 mg/week. Continue current weekly dose of 3 mg every Tues, 4.5 mg all other days. Recheck INR in 2 week.   Patient Instructions  INR at goal. Continue current weekly dose of 3 mg every Tues, and 4.5 mg all other days. Recheck INR in 3 weeks.   Patient advised to contact clinic or seek medical attention if signs/symptoms of bleeding or thromboembolism occur.  Patient verbalized understanding by repeating back information and was advised to contact me if further medication-related questions arise.   Follow-up Return in about 20 days (around 08/07/2021).  Leonides Schanz, PharmD  15 minutes spent face-to-face with the patient during the encounter. 50% of time spent on education, including signs/sx bleeding and clotting, as well as food and drug interactions with warfarin. 50% of time was spent on fingerprick POC INR sample collection,processing, results determination, and documentation

## 2021-07-29 IMAGING — DX DG CHEST 1V PORT
1 series · 1 of 1 positions shown · non-contrast
Comparison: None.

CLINICAL DATA: Weakness.  Leg swelling.

EXAM:
PORTABLE CHEST 1 VIEW

[chest ap]
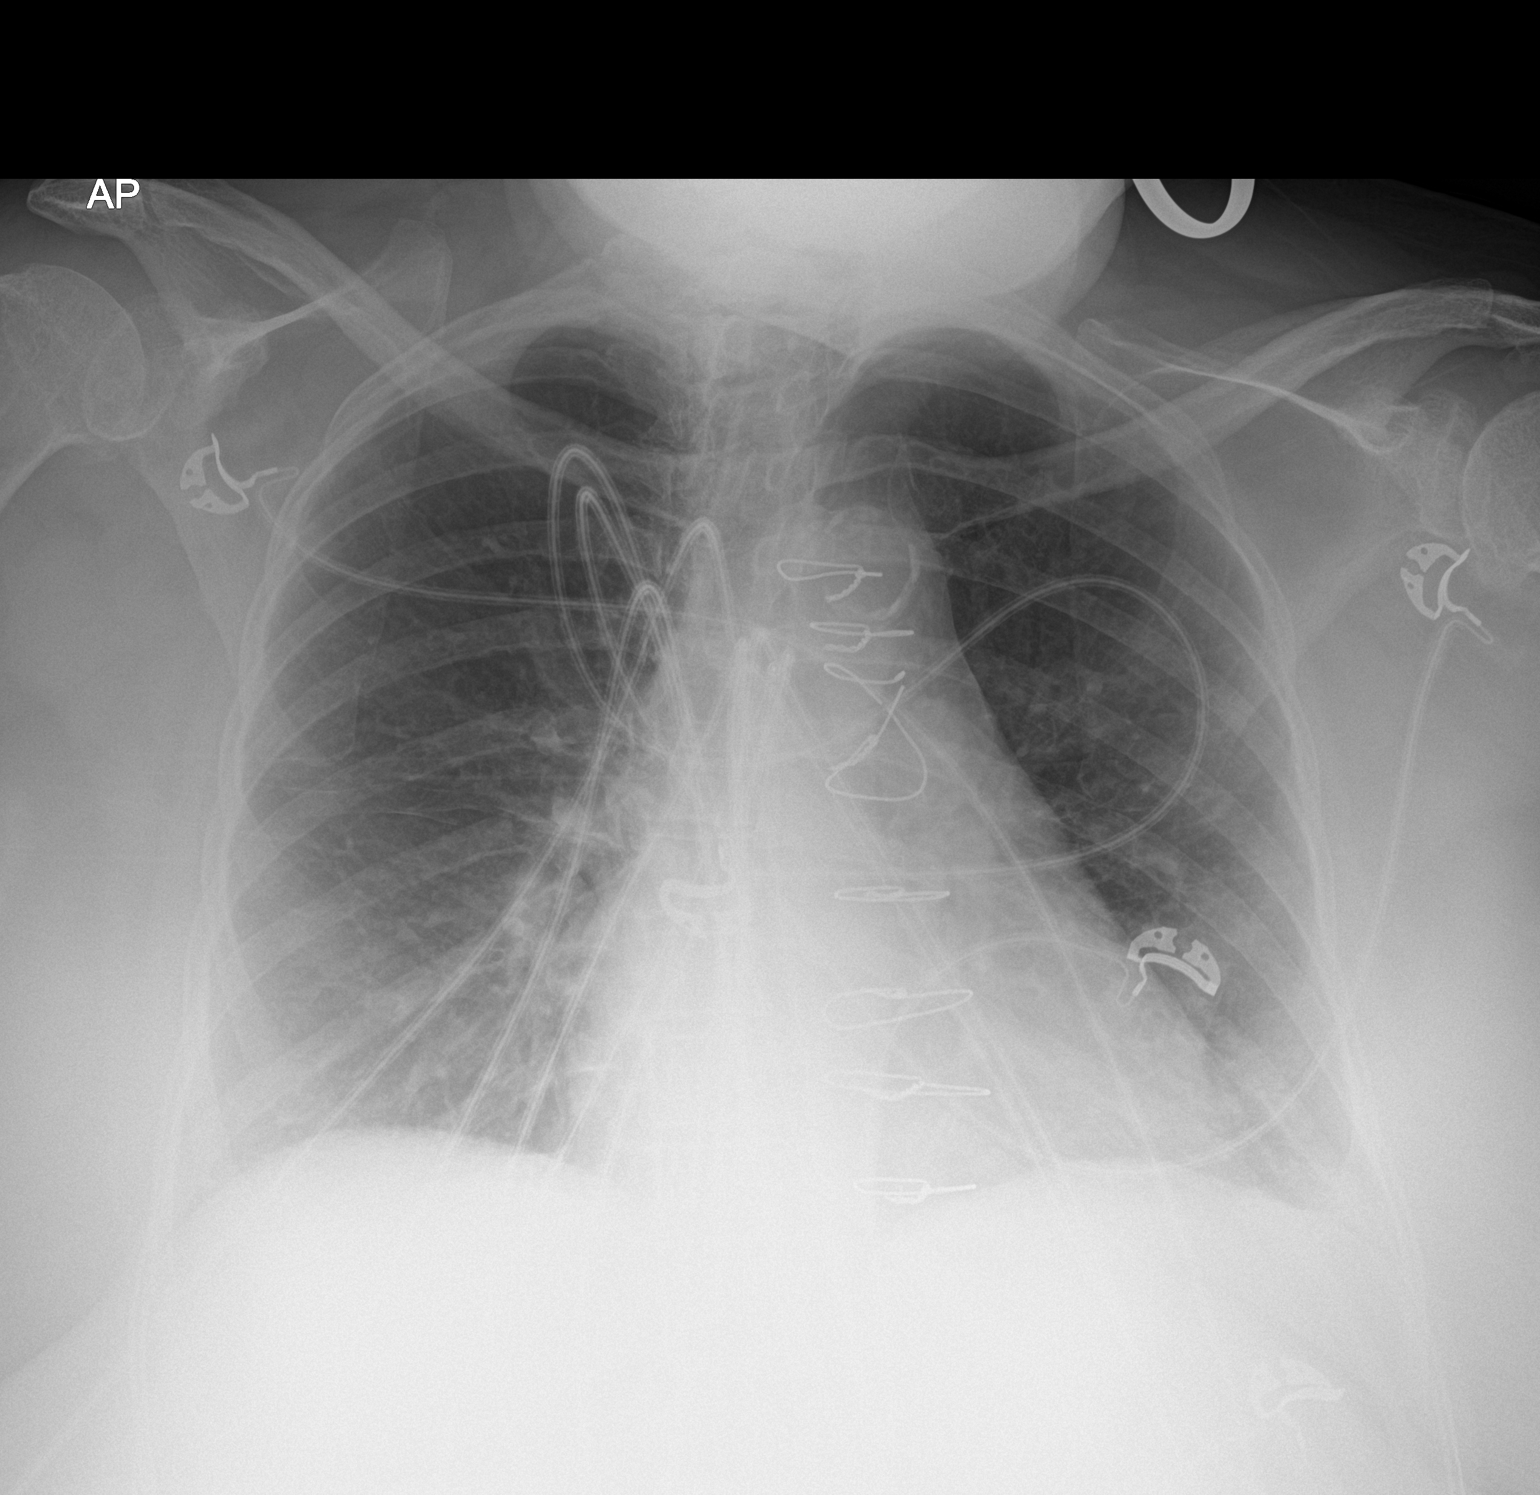

[1 of 1 positions shown; findings below may reference images not displayed]

FINDINGS: Enlarged cardiac silhouette. Calcific atherosclerosis of the aorta.
Subtle opacity at the lateral left lung base. No visible
pneumothorax. Otherwise, lungs are clear. On limited assessment, no
evidence of acute osseous abnormality.
IMPRESSION: 1. Subtle opacity at the lateral left lung base, favor atelectasis
over infection. Small pleural effusion is not excluded. Dedicated PA
and lateral radiographs could further characterize if clinically
indicated.
2. Cardiomegaly.

## 2021-08-07 ENCOUNTER — Ambulatory Visit: Payer: Medicaid Other | Admitting: Student

## 2021-08-07 ENCOUNTER — Encounter: Payer: Self-pay | Admitting: Student

## 2021-08-07 VITALS — BP 92/65 | HR 79 | Temp 97.8°F | Resp 16 | Ht 60.0 in | Wt 199.0 lb

## 2021-08-07 DIAGNOSIS — I480 Paroxysmal atrial fibrillation: Secondary | ICD-10-CM

## 2021-08-07 DIAGNOSIS — Z7901 Long term (current) use of anticoagulants: Secondary | ICD-10-CM

## 2021-08-07 DIAGNOSIS — I2510 Atherosclerotic heart disease of native coronary artery without angina pectoris: Secondary | ICD-10-CM

## 2021-08-07 DIAGNOSIS — Z5181 Encounter for therapeutic drug level monitoring: Secondary | ICD-10-CM

## 2021-08-07 DIAGNOSIS — Z952 Presence of prosthetic heart valve: Secondary | ICD-10-CM

## 2021-08-07 DIAGNOSIS — I5022 Chronic systolic (congestive) heart failure: Secondary | ICD-10-CM

## 2021-08-07 LAB — POCT INR: INR: 4.5 — AB (ref 2.0–3.0)

## 2021-08-07 NOTE — Progress Notes (Signed)
Joanna Martinez Date of Birth: October 01, 1960 MRN: [id number] Primary Care Provider:[place], Alfonso Ramus, [place] Former Cardiology Providers: Dr. Thayer Jew [phone] Primary Cardiologist: Alethia Berthold, PA-C, [place] (established care 07/02/2019)  Date: 08/07/21 Last Office Visit: 07/14/2020   Chief Complaint  Patient presents with   S/P mitral valve replacement   Congestive Heart Failure    HPI  Joanna Martinez is a 61 y.o. female who presents to the office with a  chief complaint of "60-monthfollow-up for heart failure management. "   Her past medical history and cardiovascular risk factors are: History of atherosclerotic coronary artery disease with prior [facility] on October 21, 2017 [facility], on oral anticoagulation (on warfarin), hypertension, tobacco smoker, several small strokes, [facility] HFrEF, ischemic cardiomyopathy, postmenopausal female, advanced age, obesity.   [facility] was in November 2019 due to angina which demonstrated scattered moderate non-obstructive disease with 65% stenosis of the ostial RPDA and medical therapy was recommended.  Patient was last seen in our office 02/03/2021 by Dr. TTerri Skainsat which time given soft blood pressure discontinued amlodipine and Lasix.  Patient also developed frequent UTI and therefore FWilder Gladewas discontinued. Patient now presents for 6 month follow up. She has unfortunately resumed smoking about 2 months ago, currently smoking 1 pack per day. She reports increased dyspnea on exertion over the last couple months since restarting smoking. Her INR is supratherapeutic at today's office visit at 4.5.  She states over the last 2 to 3 days she has woken up with nosebleeds.  Denies chest pain,  dizziness, syncope, near syncope.  Denies orthopnea, PND, leg edema.  Patient denies any blood loss via the GI or GU track as she is on Coumadin for mechanical mitral valve and A. fib.    FUNCTIONAL STATUS: No exercise or daily routine.    ALLERGIES: Allergies  Allergen Reactions   Nitrofurantoin     Other reaction(s): Other (see comments)   Hydrocodone-Acetaminophen Rash    Other reaction(s): Other (See Comments)   Levofloxacin Rash    Other reaction(s): Other (see comments)   Sulfamethoxazole-Trimethoprim Rash    Other reaction(s): Other (see comments)    MEDICATION LIST PRIOR TO VISIT: Current Meds  Medication Sig   Accu-Chek FastClix Lancets MISC Apply topically daily.   ACCU-CHEK GUIDE test strip daily. as directed   allopurinol (ZYLOPRIM) 100 MG tablet Take by mouth.   amoxicillin (AMOXIL) 500 MG tablet Take 2,000 mg by mouth once. Prior to dental appt   atorvastatin (LIPITOR) 80 MG tablet Take 1 tablet (80 mg total) by mouth at bedtime.   busPIRone (BUSPAR) 10 MG tablet Take 1 tablet by mouth in the morning and at bedtime.   clopidogrel (PLAVIX) 75 MG tablet Take 1 tablet (75 mg total) by mouth daily.   Diclofenac Sodium 3 % GEL Apply topically.   DULoxetine (CYMBALTA) 60 MG capsule Take 60 mg by mouth daily.   ENTRESTO 49-51 MG TAKE ONE TABLET BY MOUTH TWICE DAILY   Evolocumab (REPATHA SURECLICK) 1638MG/ML SOAJ Inject 140 mg into the skin every 14 (fourteen) days.   gabapentin (NEURONTIN) 600 MG tablet Take 1 capsule by mouth 3 (three) times daily.    hydrOXYzine (ATARAX/VISTARIL) 25 MG tablet Take 25 mg by mouth 2 (two) times daily as needed.   metoprolol  succinate (TOPROL-XL) 25 MG 24 hr tablet TAKE ONE TABLET BY MOUTH ONCE DAILY   naloxone (NARCAN) nasal spray 4 mg/0.1 mL SMARTSIG:1 Spray(s) Both Nares 1 to 2 Times Daily   omeprazole (PRILOSEC) 40 MG capsule Take 1 capsule by mouth daily.   oxyCODONE-acetaminophen (PERCOCET) 10-325 MG tablet Take by mouth. Take 1 tablet  by mouth in the morning and 1 tablet at noon and 1 tablet in the evening and 1 tablet before bedtime     PAST MEDICAL HISTORY: Past Medical History:  Diagnosis Date   Asthma    Cardiomyopathy ([place])    CHF (congestive heart failure) (HCC)    Chronic kidney disease    COPD (chronic obstructive pulmonary disease) (HCC)    Coronary artery disease    H/O heart valve replacement with mechanical valve    Heart attack (HCC)    Hyperlipidemia    Hypertension    Paroxysmal atrial fibrillation (HCC)    Stroke ([place])     PAST SURGICAL HISTORY: Past Surgical History:  Procedure Laterality Date   CARDIAC SURGERY     CORONARY ANGIOPLASTY WITH STENT PLACEMENT     MITRAL VALVE SURGERY      FAMILY HISTORY: The patient family history includes Diabetes in her brother, brother, and sister; HIV/AIDS in her father and mother; Hyperlipidemia in her brother, brother, and sister; Hypertension in her brother, brother, and sister.  SOCIAL HISTORY:  The patient  reports that she has been smoking cigarettes. She has been smoking an average of 1 pack per day. She has never used smokeless tobacco. She reports that she does not currently use alcohol. She reports that she does not use drugs.  REVIEW OF SYSTEMS: Review of Systems  Constitutional: Negative for chills and fever.  HENT:  Negative for hoarse voice and nosebleeds.   Eyes:  Negative for discharge, double vision and pain.  Cardiovascular:  Positive for dyspnea on exertion (worse since resuming smoking). Negative for chest pain, claudication, leg swelling, near-syncope, orthopnea, palpitations, paroxysmal nocturnal dyspnea and syncope.  Respiratory:  Negative for hemoptysis and shortness of breath.   Musculoskeletal:  Negative for muscle cramps and myalgias.  Gastrointestinal:  Negative for abdominal pain, constipation, diarrhea, hematemesis, hematochezia, melena, nausea and vomiting.  Genitourinary:        Urinary tract infection - none recently,  urology testing pending.  Neurological:  Negative for dizziness, focal weakness, light-headedness, loss of balance and paresthesias.   PHYSICAL EXAM:    08/07/2021    9:38 AM 02/03/2021    9:20 AM 07/14/2020   10:08 AM  Vitals with BMI  Height 5' 0"  5' 0"  5' 0"   Weight 199 lbs 205 lbs 200 lbs  BMI 38.86 50.27 74.12  Systolic 92 87 878  Diastolic 65 58 76  Pulse 79 81 74   CONSTITUTIONAL: Well-developed and well-nourished. No acute distress.  SKIN: Skin is warm and dry. No rash noted. No cyanosis. No pallor. No jaundice HEAD: Normocephalic and atraumatic.  EYES: No scleral icterus MOUTH/THROAT: Moist oral membranes.  NECK: No JVD present. No thyromegaly noted. No carotid bruits   LYMPHATIC: No visible cervical adenopathy.  CHEST Normal respiratory effort. No intercostal retractions  LUNGS: Clear to auscultation bilaterally. No stridor. No wheezes. No rales.  CARDIOVASCULAR: Regular rate and rhythm, positive M7-E7, mechanical click heard at the apex, no murmurs rubs or gallops appreciated. ABDOMINAL: Obese, soft, nontender, nondistended, positive bowel sounds all 4 quadrants. No apparent ascites.  EXTREMITIES: No peripheral edema. HEMATOLOGIC: No significant bruising NEUROLOGIC:  Oriented to person, place, and time. Nonfocal. Normal muscle tone.  Cranial nerves 2-12 grossly intact.   PSYCHIATRIC: Normal mood and affect. Normal behavior. 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  EKG: 07/02/2019: Sinus rhythm, 94 bpm, normal axis, incomplete right bundle branch block, left atrial enlargement, nonspecific T wave abnormality.   06/03/2020: Normal sinus rhythm, 66 bpm, left axis deviation, without underlying injury pattern, occasional PVCs.  02/03/2021: NSR, 78 bpm, left axis, left anterior fascicular block, without underlying injury pattern.  08/07/2021: Sinus rhythm with single PVC  at a rate of 80 bpm.  Left axis, left anterior fascicular block.  Nonspecific T wave abnormality.  Echocardiogram: 07/05/2020: Moderately depressed LV systolic function with visual EF 35-40%. Left ventricle cavity is normal in size. Normal left ventricular wall thickness. Normal global wall motion. Abnormal septal wall motion due to post-operative septum.  Mechanical mitral valve. No evidence of mitral stenosis. (PG 5.22mHG MG 3.238mG, MVA 1.8). Trace mitral regurgitation. Trace tricuspid regurgitation. No evidence of pulmonary hypertension. Compared to study dated 07/10/2019: LVEF improved from 30-35% to 35-40% otherwise no significant change.   Stress Testing: Outside records requested.   Heart Catheterization: November 2019 per report: Proximal LAD 20%. LCx stent patent.  Discrete 20% narrowing noted distal to the stent.  A separate 30% stenosis was noted in the distal LCx. RCA: Diffuse 20-30% narrowing in the proximal/mid/distal segments. Posterior atrioventricular branch 50 to 60% stenosis in its distal segment prior to the origin of the most distal posterolateral branch. Right PDA smooth tubular lesions with 60 to 70% luminal narrowing.    Lower Extremity Venous Duplex Left leg 06/03/2020: No evidence of deep vein thrombosis of the left lower extremity with normal venous return.  LABORATORY DATA:    Latest Ref Rng & Units 06/05/2020   10:39 AM  CBC  WBC 4.0 - 10.5 K/uL 5.5    Hemoglobin 12.0 - 15.0 g/dL 13.3    Hematocrit 36.0 - 46.0 % 40.1    Platelets 150 - 400 K/uL 350         Latest Ref Rng & Units 07/12/2020   10:42 AM 06/05/2020   10:39 AM 02/05/2020   10:53 AM  CMP  Glucose 65 - 99 mg/dL 151   117   109    BUN 8 - 27 mg/dL 15   10   8     Creatinine 0.57 - 1.00 mg/dL 1.13   1.25   0.99    Sodium 134 - 144 mmol/L 139   138   140    Potassium 3.5 - 5.2 mmol/L 5.2   4.0   4.3    Chloride 96 - 106 mmol/L 102   104   106    CO2 20 - 29 mmol/L 23   27   19     Calcium  8.7 - 10.3 mg/dL 9.4   8.8   9.0    Total Protein 6.5 - 8.1 g/dL  7.2     Total Bilirubin 0.3 - 1.2 mg/dL  0.5     Alkaline Phos 38 - 126 U/L  67     AST 15 - 41 U/L  23     ALT 0 - 44 U/L  13       Lipid Panel  No results found for: CHOL, TRIG, HDL, CHOLHDL, VLDL, LDLCALC, LDLDIRECT, LABVLDL  No results found for: HGBA1C No components found for: NTPROBNP  No results found for: TSH  BMP No results for input(s): NA, K, CL, CO2, GLUCOSE, BUN, CREATININE, CALCIUM, GFRNONAA, GFRAA in the last 8760 hours.   CBC No results for input(s): WBC, RBC, HGB, HCT, PLT, MCV, MCH, MCHC, RDW, LYMPHSABS, MONOABS, EOSABS, BASOSABS in the last 168 hours.  Invalid input(s): NEUTRABS  HEMOGLOBIN A1C No results found for: HGBA1C, MPG  Cardiac Panel (last 3 results) No results for input(s): CKTOTAL, CKMB, TROPONINI, RELINDX in the last 8760 hours. No results for input(s): TROPIPOC in the last 8760 hours.  BNP (last 3 results) No results for input(s): PROBNP in the last 8760 hours.   TSH No results for input(s): TSH in the last 8760 hours.  CHOLESTEROL No results for input(s): CHOL in the last 8760 hours.  Hepatic Function Panel No results for input(s): PROT, ALBUMIN, AST, ALT, ALKPHOS, BILITOT, BILIDIR, IBILI in the last 8760 hours.    External Labs: Collected: 06/10/2019 Creatinine 1.06 mg/dL. eGFR: 66 mL/min per 1.73 m Lipid profile: Total cholesterol 304, triglycerides 310, HDL 53, LDL 190 Hemoglobin A1c: 6.3 TSH: 1.26 and T4 0.68 (free)  IMPRESSION:    ICD-10-CM   1. Paroxysmal atrial fibrillation (HCC)  I48.0 EKG 12-Lead    Hemoglobin and hematocrit, blood    2. S/P mitral valve replacement  Z95.2 POCT INR    PCV ECHOCARDIOGRAM COMPLETE    3. Monitoring for long-term anticoagulant use  Z51.81 Hemoglobin and hematocrit, blood   Z79.01     4. Chronic HFrEF (heart failure with reduced ejection fraction) (HCC)  I50.22 Pro b natriuretic peptide (BNP)[id number]  [facility])    Basic metabolic panel    PCV ECHOCARDIOGRAM COMPLETE    5. Atherosclerosis of native coronary artery of native heart without angina pectoris  I25.10 Lipid Panel With LDL/HDL Ratio       RECOMMENDATIONS: Joanna Martinez is a 61 y.o. 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  Atherosclerotic coronary artery disease with prior PCI without angina: Denies chest pain. Patient continues to wish to hold of on ischemic evaluation.  Counseled patient regarding the importance of complete tobacco cessation, she unfortunately resumed smoking about 2 months ago, currently smoking a pack per day. Blood pressure remains soft.  On medication review she is not taking Minipress, which she was not taking at last office visit.  Will discontinue Minipress.  Continue Plavix, statin therapy, Repatha, Vascepa,   Chronic HFrEF, stage C, NYHA Class II Patient reports dyspnea on exertion over the last 2 months, which is likely related to resumption of smoking a pack per day.  Liver given dyspnea will obtain proBNP and BMP.  We will also obtain repeat echocardiogram. Continue Entresto and Toprol-XL There is no clinical evidence of acute decompensated heart failure, patient appears euvolemic. Reiterated the importance of fluid restriction, sodium restriction, and monitoring of daily weights. Currently in principle care management for HF management. Wilder Glade has been discontinued given frequent UTIs, encouraged patient to  follow up with urology  Ischemic cardiomyopathy: management as per above   Atrial fibrillation, paroxysmal: Rate control: metoprolol Rhythm control: N/A Thromboembolic prophylaxis: Currently on coumadin goal INR 2.5  to 3.5. Today's INR is 4.5 CHA2DS2-VASc SCORE is 6 which correlates to 9.8 % risk of stroke per year. Patient reports nosebleeds over the last 3 days. We will obtain hemoglobin/hematocrit Patient will skip today's dose of warfarin and resume at reduced dose as follows: 3.0 mg Monday/Wednesday/Friday, 4.5 mg all other days.  Recheck INR in 2 weeks  Long--term oral anticoagulation:  Indication: Paroxysmal atrial fibrillation and mechanical mitral valve. Currently on Coumadin. 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 She will need antibiotic prophylaxis prior to the procedure.  Patient verbalized understanding.  Former smoker:  Patient had previously quit smoking in November 2022, unfortunately 2 months ago she resumed smoking.  Currently smoking a pack per day Given the importance of complete smoking cessation.  Hyperlipidemia: Continue Lipitor 40 mg p.o. nightly.   Continue Repatha.   We will recheck lipid profile  FINAL MEDICATION LIST END OF ENCOUNTER: No orders of the defined types were placed in this encounter.    Current Outpatient Medications:    Accu-Chek FastClix Lancets MISC, Apply topically daily., Disp: , Rfl:    ACCU-CHEK GUIDE test strip, daily. as directed, Disp: , Rfl:    allopurinol (ZYLOPRIM) 100 MG tablet, Take by mouth., Disp: , Rfl:    amoxicillin (AMOXIL) 500 MG tablet, Take 2,000 mg by mouth once. Prior to dental appt, Disp: , Rfl:    atorvastatin (LIPITOR) 80 MG tablet, Take 1 tablet (80 mg total) by mouth at bedtime., Disp: 90 tablet, Rfl: 1   busPIRone (BUSPAR) 10 MG tablet, Take 1 tablet by mouth in the morning and at bedtime.,  Disp: , Rfl:    clopidogrel (PLAVIX) 75 MG tablet, Take 1 tablet (75 mg total) by mouth daily., Disp: 90 tablet, Rfl: 2   Diclofenac Sodium 3 % GEL, Apply topically., Disp: , Rfl:    DULoxetine (CYMBALTA) 60 MG capsule, Take 60 mg by mouth daily., Disp: , Rfl:    ENTRESTO 49-51 MG, TAKE ONE TABLET BY MOUTH TWICE DAILY, Disp: 60 tablet, Rfl: 5   Evolocumab (REPATHA SURECLICK) 967 MG/ML SOAJ, Inject 140 mg into the skin every 14 (fourteen) days., Disp: , Rfl:    gabapentin (NEURONTIN) 600 MG tablet, Take 1 capsule by mouth 3 (three) times daily. , Disp: , Rfl:    hydrOXYzine (ATARAX/VISTARIL) 25 MG tablet, Take 25 mg by mouth 2 (two) times daily as needed., Disp: , Rfl:    metoprolol succinate (TOPROL-XL) 25 MG 24 hr tablet, TAKE ONE TABLET BY MOUTH ONCE DAILY, Disp: 90 tablet, Rfl: 2   naloxone (NARCAN) nasal spray 4 mg/0.1 mL, SMARTSIG:1 Spray(s) Both Nares 1 to 2 Times Daily, Disp: , Rfl:    omeprazole (PRILOSEC) 40 MG capsule, Take 1 capsule by mouth daily., Disp: , Rfl:    oxyCODONE-acetaminophen (PERCOCET) 10-325 MG tablet, Take by mouth. Take 1 tablet by mouth in the morning and 1 tablet at noon and 1 tablet in the evening and 1 tablet before bedtime, Disp: , Rfl:    PROAIR HFA 108 (90 Base) MCG/ACT inhaler, SMARTSIG:1 Puff(s) By Mouth Every 4-6 Hours PRN, Disp: , Rfl:    QUEtiapine (SEROQUEL) 400 MG tablet, Take 400 mg by mouth at bedtime., Disp: , Rfl:    Semaglutide,0.25 or 0.5MG/DOS, (OZEMPIC, 0.25 OR 0.5 MG/DOSE,) 2 MG/1.5ML SOPN, Inject 0.5 mg into the skin once a week., Disp: , Rfl:    tiZANidine (ZANAFLEX) 4 MG  tablet, Take 4 mg by mouth 3 (three) times daily as needed., Disp: , Rfl:    VASCEPA 1 g capsule, Take 2 capsules (2 g total) by mouth 2 (two) times daily., Disp: 120 capsule, Rfl: 2   warfarin (COUMADIN) 3 MG tablet, TAKE TWO TABLETS BY MOUTH each DAY AT 4pm OR AS DIRECTED by coumadin clinic, Disp: 480 tablet, Rfl: 0  Orders Placed This Encounter  Procedures   Hemoglobin and  hematocrit, blood   Pro b natriuretic peptide (BNP)[id number] [facility])   Basic metabolic panel   Lipid Panel With LDL/HDL Ratio   POCT INR   EKG 12-Lead   PCV ECHOCARDIOGRAM COMPLETE    There are no Patient Instructions on file for this visit.   --Continue cardiac medications as reconciled in final medication list. --Return in about 2 weeks (around 08/21/2021) for INR check, HF . Or sooner if needed. --Continue follow-up with your primary care physician regarding the management of your other chronic comorbid conditions.  Patient's questions and concerns were addressed to her satisfaction. She voices understanding of the instructions provided during this encounter.   This note was created using a voice recognition software as a result there may be grammatical errors inadvertently enclosed that do not reflect the nature of this encounter. Every attempt is made to correct such errors.  Alethia Berthold, PA-C 08/07/2021, 10:47 AM Office: [phone]

## 2021-08-15 ENCOUNTER — Other Ambulatory Visit: Payer: Medicaid Other

## 2021-08-19 LAB — BASIC METABOLIC PANEL
BUN/Creatinine Ratio: 12 (ref 12–28)
BUN: 13 mg/dL (ref 8–27)
CO2: 21 mmol/L (ref 20–29)
Calcium: 9.6 mg/dL (ref 8.7–10.3)
Chloride: 104 mmol/L (ref 96–106)
Creatinine, Ser: 1.12 mg/dL — ABNORMAL HIGH (ref 0.57–1.00)
Glucose: 115 mg/dL — ABNORMAL HIGH (ref 70–99)
Potassium: 5.2 mmol/L (ref 3.5–5.2)
Sodium: 141 mmol/L (ref 134–144)
eGFR: 56 mL/min/{1.73_m2} — ABNORMAL LOW (ref >59)

## 2021-08-19 LAB — HEMOGLOBIN AND HEMATOCRIT, BLOOD
Hematocrit: 37.8 % (ref 34.0–46.6)
Hemoglobin: 12.9 g/dL (ref 11.1–15.9)

## 2021-08-19 LAB — LIPID PANEL WITH LDL/HDL RATIO
Cholesterol, Total: 301 mg/dL — ABNORMAL HIGH (ref 100–199)
HDL: 46 mg/dL (ref >39)
LDL Chol Calc (NIH): 184 mg/dL — ABNORMAL HIGH (ref 0–99)
LDL/HDL Ratio: 4 ratio — ABNORMAL HIGH (ref 0.0–3.2)
Triglycerides: 360 mg/dL — ABNORMAL HIGH (ref 0–149)
VLDL Cholesterol Cal: 71 mg/dL — ABNORMAL HIGH (ref 5–40)

## 2021-08-19 LAB — PRO B NATRIURETIC PEPTIDE: NT-Pro BNP: 288 pg/mL — ABNORMAL HIGH (ref 0–287)

## 2021-08-22 MED ORDER — FUROSEMIDE 20 MG PO TABS: 20.0000 mg | ORAL_TABLET | Freq: Every day | ORAL | 0 refills | Status: DC | PRN

## 2021-08-22 NOTE — Progress Notes (Signed)
Tried calling patient no answer left a vm

## 2021-08-22 NOTE — Addendum Note (Signed)
Addended by: Trula Ore on: 08/22/2021 03:49 PM   Modules accepted: Orders

## 2021-08-24 NOTE — Progress Notes (Signed)
Called pt, no answer. Left vm requesting call back?

## 2021-08-25 ENCOUNTER — Ambulatory Visit: Payer: Medicaid Other | Admitting: Student

## 2021-08-25 ENCOUNTER — Other Ambulatory Visit: Payer: Self-pay

## 2021-08-25 DIAGNOSIS — I5022 Chronic systolic (congestive) heart failure: Secondary | ICD-10-CM

## 2021-08-25 DIAGNOSIS — Z5181 Encounter for therapeutic drug level monitoring: Secondary | ICD-10-CM

## 2021-08-25 DIAGNOSIS — Z952 Presence of prosthetic heart valve: Secondary | ICD-10-CM

## 2021-08-25 DIAGNOSIS — I480 Paroxysmal atrial fibrillation: Secondary | ICD-10-CM

## 2021-08-25 LAB — POCT INR: INR: 3.9 — AB (ref 2.0–3.0)

## 2021-08-25 NOTE — Progress Notes (Signed)
Patient is aware and has already picked up her Lasix and will start it today and knows to go have blood work in 1 week. Shary Key said she will place orders.

## 2021-08-30 ENCOUNTER — Ambulatory Visit: Payer: Medicaid Other | Admitting: Student

## 2021-08-30 ENCOUNTER — Ambulatory Visit: Payer: Medicaid Other

## 2021-08-30 DIAGNOSIS — Z5181 Encounter for therapeutic drug level monitoring: Secondary | ICD-10-CM

## 2021-08-30 DIAGNOSIS — I5022 Chronic systolic (congestive) heart failure: Secondary | ICD-10-CM

## 2021-08-30 DIAGNOSIS — Z952 Presence of prosthetic heart valve: Secondary | ICD-10-CM

## 2021-08-30 DIAGNOSIS — I480 Paroxysmal atrial fibrillation: Secondary | ICD-10-CM

## 2021-08-30 LAB — POCT INR: INR: 3.5 — AB (ref 2.0–3.0)

## 2021-08-30 NOTE — Progress Notes (Signed)
Anticoagulation Management Joanna Martinez is a 61 y.o. female who reports to the clinic for monitoring of warfarin treatment.    Indication: atrial fibrillation and s/p [employer] mechanical mitral valve replacement in March 2002  ; CHA2DS2 Vasc Score 6 (Female, CHF hx, HTN hx, Stroke/TIA hx, valvular heart disease), HAS-BLED 1 (Stroke hx)   Duration: indefinite Supervising physician: Tessa Lerner  Anticoagulation Clinic Visit History:  Hx of multiple previous strokes. Most recent incidence 3 or 4 years ago. Pt also on Plavix for history of coronary artery disease status post drug-eluting stent in August 2019. Noteded major DDI b/w warfarin and plavix of increased bleeding risk. Noted DDI b/w warfarin and allopurinol about increased anticoagulant effect as well. Pt denies any recent allopurinol dose changes.  Patient does not report signs/symptoms of bleeding or thromboembolism.   Anticoagulation Episode Summary     Current INR goal:  2.5-3.5  TTR:  48.3 % (2.1 y)  Next INR check:  09/27/2021  INR from last check:  3.5 (08/30/2021)  Weekly max warfarin dose:    Target end date:  Indefinite  INR check location:    Preferred lab:    Send INR reminders to:     Indications   S/P mitral valve replacement [Z95.2] Paroxysmal atrial fibrillation (HCC) [I48.0] Monitoring for long-term anticoagulant use [Z51.81 Z79.01]        Comments:          Allergies  Allergen Reactions   Nitrofurantoin     Other reaction(s): Other (see comments)   Hydrocodone-Acetaminophen Rash    Other reaction(s): Other (See Comments)   Levofloxacin Rash    Other reaction(s): Other (see comments)   Sulfamethoxazole-Trimethoprim Rash    Other reaction(s): Other (see comments)    Current Outpatient Medications:    Accu-Chek FastClix Lancets MISC, Apply topically daily., Disp: , Rfl:    ACCU-CHEK GUIDE test strip, daily. as directed, Disp: , Rfl:    allopurinol (ZYLOPRIM) 100 MG tablet, Take by mouth.,  Disp: , Rfl:    amoxicillin (AMOXIL) 500 MG tablet, Take 2,000 mg by mouth once. Prior to dental appt, Disp: , Rfl:    atorvastatin (LIPITOR) 80 MG tablet, Take 1 tablet (80 mg total) by mouth at bedtime., Disp: 90 tablet, Rfl: 1   busPIRone (BUSPAR) 10 MG tablet, Take 1 tablet by mouth in the morning and at bedtime., Disp: , Rfl:    clopidogrel (PLAVIX) 75 MG tablet, Take 1 tablet (75 mg total) by mouth daily., Disp: 90 tablet, Rfl: 2   Diclofenac Sodium 3 % GEL, Apply topically., Disp: , Rfl:    DULoxetine (CYMBALTA) 60 MG capsule, Take 60 mg by mouth daily., Disp: , Rfl:    ENTRESTO 49-51 MG, TAKE ONE TABLET BY MOUTH TWICE DAILY, Disp: 60 tablet, Rfl: 5   Evolocumab (REPATHA SURECLICK) 140 MG/ML SOAJ, Inject 140 mg into the skin every 14 (fourteen) days., Disp: , Rfl:    furosemide (LASIX) 20 MG tablet, Take 1 tablet (20 mg total) by mouth daily as needed (shortness of breath, swelling, weight gain)., Disp: 30 tablet, Rfl: 0   gabapentin (NEURONTIN) 600 MG tablet, Take 1 capsule by mouth 3 (three) times daily. , Disp: , Rfl:    hydrOXYzine (ATARAX/VISTARIL) 25 MG tablet, Take 25 mg by mouth 2 (two) times daily as needed., Disp: , Rfl:    metoprolol succinate (TOPROL-XL) 25 MG 24 hr tablet, TAKE ONE TABLET BY MOUTH ONCE DAILY, Disp: 90 tablet, Rfl: 2   naloxone (NARCAN) nasal spray  4 mg/0.1 mL, SMARTSIG:1 Spray(s) Both Nares 1 to 2 Times Daily, Disp: , Rfl:    omeprazole (PRILOSEC) 40 MG capsule, Take 1 capsule by mouth daily., Disp: , Rfl:    oxyCODONE-acetaminophen (PERCOCET) 10-325 MG tablet, Take by mouth. Take 1 tablet by mouth in the morning and 1 tablet at noon and 1 tablet in the evening and 1 tablet before bedtime, Disp: , Rfl:    PROAIR HFA 108 (90 Base) MCG/ACT inhaler, SMARTSIG:1 Puff(s) By Mouth Every 4-6 Hours PRN, Disp: , Rfl:    QUEtiapine (SEROQUEL) 400 MG tablet, Take 400 mg by mouth at bedtime., Disp: , Rfl:    Semaglutide,0.25 or 0.5MG /DOS, (OZEMPIC, 0.25 OR 0.5 MG/DOSE,) 2  MG/1.5ML SOPN, Inject 0.5 mg into the skin once a week., Disp: , Rfl:    tiZANidine (ZANAFLEX) 4 MG tablet, Take 4 mg by mouth 3 (three) times daily as needed., Disp: , Rfl:    VASCEPA 1 g capsule, Take 2 capsules (2 g total) by mouth 2 (two) times daily., Disp: 120 capsule, Rfl: 2   warfarin (COUMADIN) 3 MG tablet, TAKE TWO TABLETS BY MOUTH each DAY AT 4pm OR AS DIRECTED by coumadin clinic, Disp: 480 tablet, Rfl: 0 Past Medical History:  Diagnosis Date   Asthma    Cardiomyopathy (HCC)    CHF (congestive heart failure) (HCC)    Chronic kidney disease    COPD (chronic obstructive pulmonary disease) (HCC)    Coronary artery disease    H/O heart valve replacement with mechanical valve    Heart attack (HCC)    Hyperlipidemia    Hypertension    Paroxysmal atrial fibrillation (HCC)    Stroke [facility])     ASSESSMENT  Recent Results: The most recent result is correlated with 30 mg per week:  Lab Results  Component Value Date   INR 3.5 (A) 08/30/2021   INR 3.9 (A) 08/25/2021   INR 4.5 (A) 08/07/2021    Anticoagulation Dosing: Description   INR is at goal. 3.5 3 mg Tuesdays and Thursdays  4.5 mg all other days  Recheck INR in 4 weeks     INR today: Therapeutic.   PLAN Weekly dose was unchanged at 28.5 mg/week.  3 mg every Tuesday and Thursday, 4.5 mg all other days.  Recheck INR in 4 weeks.    There are no Patient Instructions on file for this visit.  Patient advised to contact clinic or seek medical attention if signs/symptoms of bleeding or thromboembolism occur.  Patient verbalized understanding by repeating back information and was advised to contact me if further medication-related questions arise.   Follow-up No follow-ups on file.   Rayford Halsted, PA-C 08/30/2021, 10:57 AM Office: [phone]

## 2021-09-01 ENCOUNTER — Telehealth: Payer: Self-pay | Admitting: Student

## 2021-09-01 NOTE — Telephone Encounter (Signed)
Tried calling patient no answer left a vm to call back

## 2021-09-01 NOTE — Telephone Encounter (Signed)
Patient wants to know if there are any results yet. She had blood work done the other day, and her echo. She also has a question about stopping a medication or if she needs to continue. Please call her.

## 2021-09-02 LAB — LDL CHOLESTEROL, DIRECT: LDL Direct: 238 mg/dL — ABNORMAL HIGH (ref 0–99)

## 2021-09-02 LAB — LIPID PANEL WITH LDL/HDL RATIO
Cholesterol, Total: 351 mg/dL — ABNORMAL HIGH (ref 100–199)
HDL: 42 mg/dL (ref >39)
LDL Chol Calc (NIH): 226 mg/dL — ABNORMAL HIGH (ref 0–99)
LDL/HDL Ratio: 5.4 ratio — ABNORMAL HIGH (ref 0.0–3.2)
Triglycerides: 387 mg/dL — ABNORMAL HIGH (ref 0–149)
VLDL Cholesterol Cal: 83 mg/dL — ABNORMAL HIGH (ref 5–40)

## 2021-09-02 LAB — CMP14+EGFR
ALT: 14 IU/L (ref 0–32)
AST: 18 IU/L (ref 0–40)
Albumin/Globulin Ratio: 1.4 (ref 1.2–2.2)
Albumin: 4.1 g/dL (ref 3.8–4.8)
Alkaline Phosphatase: 95 IU/L (ref 44–121)
BUN/Creatinine Ratio: 8 — ABNORMAL LOW (ref 12–28)
BUN: 9 mg/dL (ref 8–27)
Bilirubin Total: 0.2 mg/dL (ref 0.0–1.2)
CO2: 21 mmol/L (ref 20–29)
Calcium: 9.7 mg/dL (ref 8.7–10.3)
Chloride: 103 mmol/L (ref 96–106)
Creatinine, Ser: 1.18 mg/dL — ABNORMAL HIGH (ref 0.57–1.00)
Globulin, Total: 3 g/dL (ref 1.5–4.5)
Glucose: 129 mg/dL — ABNORMAL HIGH (ref 70–99)
Potassium: 4.2 mmol/L (ref 3.5–5.2)
Sodium: 140 mmol/L (ref 134–144)
Total Protein: 7.1 g/dL (ref 6.0–8.5)
eGFR: 52 mL/min/{1.73_m2} — ABNORMAL LOW (ref >59)

## 2021-09-02 LAB — HEMOGLOBIN AND HEMATOCRIT, BLOOD
Hematocrit: 38.2 % (ref 34.0–46.6)
Hemoglobin: 12.6 g/dL (ref 11.1–15.9)

## 2021-09-02 LAB — BASIC METABOLIC PANEL
BUN/Creatinine Ratio: 9 — ABNORMAL LOW (ref 12–28)
BUN: 10 mg/dL (ref 8–27)
CO2: 22 mmol/L (ref 20–29)
Calcium: 9.8 mg/dL (ref 8.7–10.3)
Chloride: 107 mmol/L — ABNORMAL HIGH (ref 96–106)
Creatinine, Ser: 1.11 mg/dL — ABNORMAL HIGH (ref 0.57–1.00)
Glucose: 123 mg/dL — ABNORMAL HIGH (ref 70–99)
Potassium: 4.4 mmol/L (ref 3.5–5.2)
Sodium: 144 mmol/L (ref 134–144)
eGFR: 56 mL/min/{1.73_m2} — ABNORMAL LOW (ref >59)

## 2021-09-02 LAB — PRO B NATRIURETIC PEPTIDE: NT-Pro BNP: 213 pg/mL (ref 0–287)

## 2021-09-04 NOTE — Telephone Encounter (Signed)
Patient called requesting results of her echo and labs and also asked if she needs to continue taking Lasix

## 2021-09-04 NOTE — Telephone Encounter (Signed)
Just resulted her echo, it is unchanged/stable. She may stop daily lasix as BNP is now normal. She can take Lasix as needed.

## 2021-09-04 NOTE — Progress Notes (Signed)
Patient is aware 

## 2021-09-04 NOTE — Telephone Encounter (Signed)
Called and spoke to patient she is aware of results

## 2021-09-06 NOTE — Progress Notes (Signed)
Called and spoke with patient, she is refusing to schedule an appointment and stated that she will be coming in when she was told by Ms. Cantwell, and she IS NOT coming in for an appointment earlier than 6 months and if you wanted to talk to her, you can call her.

## 2021-09-06 NOTE — Progress Notes (Signed)
Tried calling patient no answer mailbox full unable to leave vm

## 2021-09-08 NOTE — Progress Notes (Signed)
Attempted to call patient x2 to discuss further. Voicemail box is full, unable to leave a message.

## 2021-09-22 NOTE — Progress Notes (Signed)
Patient has upcoming appt with Dr. Odis Hollingshead to discuss further.

## 2021-09-26 ENCOUNTER — Other Ambulatory Visit: Payer: Self-pay | Admitting: Cardiology

## 2021-09-26 DIAGNOSIS — I5022 Chronic systolic (congestive) heart failure: Secondary | ICD-10-CM

## 2021-09-27 ENCOUNTER — Ambulatory Visit: Payer: Medicaid Other | Admitting: Cardiology

## 2021-09-27 DIAGNOSIS — Z5181 Encounter for therapeutic drug level monitoring: Secondary | ICD-10-CM

## 2021-09-27 DIAGNOSIS — I480 Paroxysmal atrial fibrillation: Secondary | ICD-10-CM

## 2021-09-27 DIAGNOSIS — Z952 Presence of prosthetic heart valve: Secondary | ICD-10-CM

## 2021-09-27 LAB — POCT INR: INR: 3.5 — AB (ref 2.0–3.0)

## 2021-09-28 ENCOUNTER — Encounter: Payer: Self-pay | Admitting: Cardiology

## 2021-09-28 ENCOUNTER — Ambulatory Visit: Payer: Medicaid Other | Admitting: Cardiology

## 2021-09-28 VITALS — BP 122/68 | HR 78 | Temp 97.8°F | Resp 16 | Ht 60.0 in | Wt 206.0 lb

## 2021-09-28 DIAGNOSIS — Z7901 Long term (current) use of anticoagulants: Secondary | ICD-10-CM

## 2021-09-28 DIAGNOSIS — I255 Ischemic cardiomyopathy: Secondary | ICD-10-CM

## 2021-09-28 DIAGNOSIS — I480 Paroxysmal atrial fibrillation: Secondary | ICD-10-CM

## 2021-09-28 DIAGNOSIS — E7800 Pure hypercholesterolemia, unspecified: Secondary | ICD-10-CM

## 2021-09-28 DIAGNOSIS — Z8673 Personal history of transient ischemic attack (TIA), and cerebral infarction without residual deficits: Secondary | ICD-10-CM

## 2021-09-28 DIAGNOSIS — Z792 Long term (current) use of antibiotics: Secondary | ICD-10-CM

## 2021-09-28 DIAGNOSIS — Z955 Presence of coronary angioplasty implant and graft: Secondary | ICD-10-CM

## 2021-09-28 DIAGNOSIS — I5022 Chronic systolic (congestive) heart failure: Secondary | ICD-10-CM

## 2021-09-28 DIAGNOSIS — I2510 Atherosclerotic heart disease of native coronary artery without angina pectoris: Secondary | ICD-10-CM

## 2021-09-28 DIAGNOSIS — I10 Essential (primary) hypertension: Secondary | ICD-10-CM

## 2021-09-28 DIAGNOSIS — Z952 Presence of prosthetic heart valve: Secondary | ICD-10-CM

## 2021-09-28 DIAGNOSIS — F17210 Nicotine dependence, cigarettes, uncomplicated: Secondary | ICD-10-CM

## 2021-09-28 MED ORDER — VASCEPA 1 G PO CAPS: 2.0000 g | ORAL_CAPSULE | Freq: Two times a day (BID) | ORAL | 2 refills | Status: DC

## 2021-09-28 MED ORDER — REPATHA SURECLICK 140 MG/ML ~~LOC~~ SOAJ: 140.0000 mg | SUBCUTANEOUS | 3 refills | Status: AC

## 2021-09-28 MED ORDER — ATORVASTATIN CALCIUM 80 MG PO TABS: 80.0000 mg | ORAL_TABLET | Freq: Every day | ORAL | 0 refills | Status: DC

## 2021-09-28 NOTE — Progress Notes (Signed)
Joanna Martinez Date of Birth: 1960/07/17 MRN: [id number] Primary Care Provider:[place], Alfonso Ramus, [place] Former [facility]iology Providers: Dr. Thayer Jew [phone] Primary Cardiologist: Rex Kras, DO, [facility] (established care 07/02/2019)  Date: 09/28/21 Last Office Visit: 08/07/2021  Chief Complaint  Patient presents with   Heart failure management    Follow-up    HPI  Joanna Martinez is a 61 y.o. 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   Formally seen at [facility] and her last heart cath was in November 2019 due to angina which demonstrated scattered moderate non-obstructive disease with 65% stenosis of the ostial RPDA and medical therapy was recommended.  Patient presents today for follow-up.  She denies angina pectoris or heart failure symptoms.  She continues to smoke 1 pack/day.  Has had poor dietary indiscretion -states that she eats junk food, chips, cookies as they are easily available at her facility via vending machines.  The blood work that was done on September 01, 2021 notes poorly controlled LDL, triglycerides, and total cholesterol.  Patient thinks that this was a nonfasting blood sample.  And has also stopped her Ozempic and Repatha for the last 2 months along with atorvastatin for reasons unknown.  FUNCTIONAL STATUS: No exercise or daily routine.    ALLERGIES: Allergies  Allergen Reactions   Nitrofurantoin     Other reaction(s): Other (see comments)    Hydrocodone-Acetaminophen Rash    Other reaction(s): Other (See Comments)   Levofloxacin Rash    Other reaction(s): Other (see comments)   Sulfamethoxazole-Trimethoprim Rash    Other reaction(s): Other (see comments)    MEDICATION LIST PRIOR TO VISIT: Current Meds  Medication Sig   Accu-Chek FastClix Lancets MISC Apply topically daily.   ACCU-CHEK GUIDE test strip daily. as directed   albuterol (VENTOLIN HFA) 108 (90 Base) MCG/ACT inhaler Inhale 1 puff into the lungs every 6 (six) hours as needed for wheezing or shortness of breath.   atorvastatin (LIPITOR) 80 MG tablet Take 1 tablet (80 mg total) by mouth at bedtime.   busPIRone (BUSPAR) 10 MG tablet Take 1 tablet by mouth in the morning and at bedtime.   clopidogrel (PLAVIX) 75 MG tablet Take 1 tablet (75 mg total) by mouth daily.   Diclofenac Sodium 3 % GEL Apply topically.   DULoxetine (CYMBALTA) 30 MG capsule Take 60 mg by mouth daily.   ENTRESTO 49-51 MG TAKE ONE TABLET BY MOUTH TWICE DAILY   ferrous sulfate 325 (65 FE) MG EC tablet Take 1 tablet by mouth daily at 12 noon.   gabapentin (NEURONTIN) 600 MG tablet Take 1 capsule by mouth 3 (three) times daily.    magnesium oxide (MAG-OX) 400 MG tablet Take 1 tablet by mouth daily at 12 noon.   metoprolol succinate (TOPROL-XL) 25 MG 24 hr tablet TAKE ONE TABLET BY MOUTH ONCE DAILY   naloxone (NARCAN) nasal spray 4 mg/0.1 mL SMARTSIG:1 Spray(s) Both Nares 1 to 2 Times Daily   omeprazole (PRILOSEC) 40 MG capsule Take 1 capsule by mouth daily.   oxyCODONE-acetaminophen (PERCOCET) 10-325 MG tablet Take by mouth. Take 1 tablet by mouth in the morning  and 1 tablet at noon and 1 tablet in the evening and 1 tablet before bedtime   prazosin (MINIPRESS) 1 MG capsule Take 1 mg by mouth at bedtime.   QUEtiapine (SEROQUEL) 300 MG tablet Take by mouth at bedtime. 1 and 1/2 tablet daily   Semaglutide,0.25 or 0.5MG/DOS, (OZEMPIC, 0.25 OR 0.5 MG/DOSE,) 2 MG/1.5ML SOPN Inject 0.5 mg into the skin once a  week.   warfarin (COUMADIN) 3 MG tablet TAKE TWO TABLETS BY MOUTH each DAY AT 4pm OR AS DIRECTED by coumadin clinic   [DISCONTINUED] Evolocumab (REPATHA SURECLICK) 709 MG/ML SOAJ Inject 140 mg into the skin every 14 (fourteen) days.     PAST MEDICAL HISTORY: Past Medical History:  Diagnosis Date   Asthma    Cardiomyopathy ([place])    CHF (congestive heart failure) (HCC)    Chronic kidney disease    COPD (chronic obstructive pulmonary disease) (HCC)    Coronary artery disease    H/O heart valve replacement with mechanical valve    Heart attack (HCC)    Hyperlipidemia    Hypertension    Paroxysmal atrial fibrillation (HCC)    Stroke ([place])     PAST SURGICAL HISTORY: Past Surgical History:  Procedure Laterality Date   CARDIAC SURGERY     CORONARY ANGIOPLASTY WITH STENT PLACEMENT     MITRAL VALVE SURGERY      FAMILY HISTORY: The patient family history includes Diabetes in her brother, brother, and sister; HIV/AIDS in her father and mother; Hyperlipidemia in her brother, brother, and sister; Hypertension in her brother, brother, and sister.  SOCIAL HISTORY:  The patient  reports that she has been smoking cigarettes. She has been smoking an average of 1 pack per day. She has never used smokeless tobacco. She reports that she does not currently use alcohol. She reports that she does not use drugs.  REVIEW OF SYSTEMS: Review of Systems  Constitutional: Negative for chills and fever.  HENT:  Negative for hoarse voice and nosebleeds.   Eyes:  Negative for discharge, double vision and pain.  Cardiovascular:  Positive for dyspnea on exertion (worse since resuming smoking). Negative for chest pain, claudication, leg swelling, near-syncope, orthopnea, palpitations, paroxysmal nocturnal dyspnea and syncope.  Respiratory:  Negative for hemoptysis and shortness of breath.   Musculoskeletal:  Negative for muscle cramps and myalgias.  Gastrointestinal:  Negative for abdominal pain, constipation,  diarrhea, hematemesis, hematochezia, melena, nausea and vomiting.  Neurological:  Negative for dizziness, focal weakness, light-headedness, loss of balance and paresthesias.    PHYSICAL EXAM:    09/28/2021    2:02 PM 08/07/2021    9:38 AM 02/03/2021    9:20 AM  Vitals with BMI  Height 5' 0"  5' 0"  5' 0"   Weight 206 lbs 199 lbs 205 lbs  BMI 40.23 62.83 66.29  Systolic 476 92 87  Diastolic 68 65 58  Pulse 78 79 81   CONSTITUTIONAL: Well-developed and well-nourished. No acute distress.  SKIN: Skin is warm and dry. No rash noted. No cyanosis. No pallor. No jaundice HEAD: Normocephalic and atraumatic.  EYES: No scleral icterus MOUTH/THROAT: Moist oral membranes.  NECK: No JVD present. No thyromegaly noted. No carotid bruits   CHEST Normal respiratory effort. No intercostal retractions  LUNGS: Clear to auscultation bilaterally. No stridor. No wheezes. No rales.  CARDIOVASCULAR: Regular rate and rhythm, positive L4-Y5, mechanical click heard at the apex, no rubs or gallops appreciated. ABDOMINAL: Obese, soft, nontender, nondistended, positive bowel sounds all 4 quadrants. No apparent ascites.  EXTREMITIES:  No peripheral edema. HEMATOLOGIC: No significant bruising NEUROLOGIC: Oriented to person, place, and time. Nonfocal. Normal muscle tone.  Cranial nerves 2-12 grossly intact.   PSYCHIATRIC: Normal mood and affect. Normal behavior. Cooperative  CARDIAC DATABASE: Status post SJM mechanical mitral valve placement in March 2002 at [facility] due to rheumatic mitral stenosis.  EKG: 07/02/2019: Sinus rhythm, 94 bpm, normal axis, incomplete right bundle branch block, left atrial enlargement, nonspecific T wave abnormality.   06/03/2020: Normal sinus rhythm, 66 bpm, left axis deviation, without underlying injury pattern, occasional PVCs.  02/03/2021: NSR, 78 bpm, left axis, left anterior fascicular block, without underlying injury pattern.  08/07/2021: Sinus rhythm with single PVC at a rate of  80 bpm.  Left axis, left anterior fascicular block.  Nonspecific T wave abnormality.  Echocardiogram: 08/30/2021:  Left ventricle cavity is normal in size. Mild concentric hypertrophy of the left ventricle. Abnormal septal wall motion due to post-operative valve. Moderate global hypokinesis. LVEF 35-40%. Indeterminate diastolic filling pattern.  Left atrial cavity is severely dilated.  S/p St Jude mechanical mitral valve. Well seated valve. No significant prosthetic valve stenosis. Mean PG 3 mmHg, MVA 2.1 cm2 by PHT method.   Trace mitral regurgitation.  No evidence of pulmonary hypertension.  Compared to previous study on 07/25/2020, no significant change noted.   Stress Testing: Outside records requested.   Heart Catheterization: November 2019 per report: Proximal LAD 20%. LCx stent patent.  Discrete 20% narrowing noted distal to the stent.  A separate 30% stenosis was noted in the distal LCx. RCA: Diffuse 20-30% narrowing in the proximal/mid/distal segments. Posterior atrioventricular branch 50 to 60% stenosis in its distal segment prior to the origin of the most distal posterolateral branch. Right PDA smooth tubular lesions with 60 to 70% luminal narrowing.    Lower Extremity Venous Duplex Left leg 06/03/2020: No evidence of deep vein thrombosis of the left lower extremity with normal venous return.  LABORATORY DATA:    Latest Ref Rng & Units 09/01/2021    9:18 AM 08/18/2021    9:47 AM 06/05/2020   10:39 AM  CBC  WBC 4.0 - 10.5 K/uL   5.5   Hemoglobin 11.1 - 15.9 g/dL 12.6  12.9  13.3   Hematocrit 34.0 - 46.6 % 38.2  37.8  40.1   Platelets 150 - 400 K/uL   350        Latest Ref Rng & Units 09/01/2021    9:18 AM 09/01/2021    9:02 AM 08/18/2021    9:47 AM  CMP  Glucose 70 - 99 mg/dL 129  123  115   BUN 8 - 27 mg/dL 9  10  13    Creatinine 0.57 - 1.00 mg/dL 1.18  1.11  1.12   Sodium 134 - 144 mmol/L 140  144  141   Potassium 3.5 - 5.2 mmol/L 4.2  4.4  5.2   Chloride 96 - 106  mmol/L 103  107  104   CO2 20 - 29 mmol/L 21  22  21    Calcium 8.7 - 10.3 mg/dL 9.7  9.8  9.6   Total Protein 6.0 - 8.5 g/dL 7.1     Total Bilirubin 0.0 - 1.2 mg/dL 0.2     Alkaline Phos 44 - 121 IU/L 95     AST 0 - 40 IU/L 18     ALT 0 - 32 IU/L 14       Lipid Panel     Component Value Date/Time   CHOL 351 (  H) 09/01/2021 0918   TRIG 387 (H) 09/01/2021 0918   HDL 42 09/01/2021 0918   LDLCALC 226 (H) 09/01/2021 0918   LDLDIRECT 238 (H) 09/01/2021 0918   LABVLDL 83 (H) 09/01/2021 0918    No results found for: "HGBA1C" No components found for: "NTPROBNP" No results found for: "TSH"  BMP Recent Labs    08/18/21 0947 09/01/21 0902 09/01/21 0918  NA 141 144 140  K 5.2 4.4 4.2  CL 104 107* 103  CO2 21 22 21   GLUCOSE 115* 123* 129*  BUN 13 10 9   CREATININE 1.12* 1.11* 1.18*  CALCIUM 9.6 9.8 9.7     CBC No results for input(s): "WBC", "RBC", "HGB", "HCT", "PLT", "MCV", "MCH", "MCHC", "RDW", "LYMPHSABS", "MONOABS", "EOSABS", "BASOSABS" in the last 168 hours.  Invalid input(s): "NEUTRABS"  HEMOGLOBIN A1C No results found for: "HGBA1C", "MPG"  Cardiac Panel (last 3 results) No results for input(s): "CKTOTAL", "CKMB", "TROPONINI", "RELINDX" in the last 8760 hours. No results for input(s): "TROPIPOC" in the last 8760 hours.  BNP (last 3 results) Recent Labs    08/18/21 0947 09/01/21 0902  PROBNP 288* 213     TSH No results for input(s): "TSH" in the last 8760 hours.  CHOLESTEROL Recent Labs    08/18/21 0947 09/01/21 0918  CHOL 301* 351*    Hepatic Function Panel Recent Labs    09/01/21 0918  PROT 7.1  ALBUMIN 4.1  AST 18  ALT 14  ALKPHOS 95  BILITOT 0.2      External Labs: Collected: 06/10/2019 Creatinine 1.06 mg/dL. eGFR: 66 mL/min per 1.73 m Lipid profile: Total cholesterol 304, triglycerides 310, HDL 53, LDL 190 Hemoglobin A1c: 6.3 TSH: 1.26 and T4 0.68 (free)  IMPRESSION:    ICD-10-CM   1. Atherosclerosis of native coronary artery  of native heart without angina pectoris  I25.10 VASCEPA 1 g capsule    Evolocumab (REPATHA SURECLICK) 263 MG/ML SOAJ    atorvastatin (LIPITOR) 80 MG tablet    2. History of coronary angioplasty with insertion of stent  Z95.5 VASCEPA 1 g capsule    Evolocumab (REPATHA SURECLICK) 335 MG/ML SOAJ    atorvastatin (LIPITOR) 80 MG tablet    3. Ischemic cardiomyopathy  I25.5 VASCEPA 1 g capsule    Evolocumab (REPATHA SURECLICK) 456 MG/ML SOAJ    atorvastatin (LIPITOR) 80 MG tablet    4. Chronic HFrEF (heart failure with reduced ejection fraction) (HCC)  I50.22 VASCEPA 1 g capsule    5. Pure hypercholesterolemia  E78.00 Lipid Panel With LDL/HDL Ratio    Lipoprotein A (LPA)    LDL cholesterol, direct    Apo A1 + B + Ratio    CMP14+EGFR    6. Paroxysmal atrial fibrillation (HCC)  I48.0     7. S/P mitral valve replacement  Z95.2     8. Need for prophylactic antibiotic  Z79.2     9. Long term (current) use of anticoagulants  Z79.01 Hemoglobin and hematocrit, blood    10. Cigarette smoker  F17.210     11. History of stroke  Z86.73     12. Benign hypertension  I10     13. Class 3 severe obesity due to excess calories with serious comorbidity and body mass index (BMI) of 40.0 to 44.9 in adult [facility])  E66.01    Z68.41        RECOMMENDATIONS: Joanna Martinez is a 61 y.o. 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  Atherosclerotic coronary artery disease with prior PCI without angina /ischemic  cardiomyopathy: Denies angina pectoris. In the past has refused to undergo myocardial perfusion imaging to evaluate for reversible ischemia. Educated on importance of complete smoking cessation. Educated on the importance of secondary prevention. Repeat fasting lipid profile to reevaluate her lipids. She stopped taking Repatha, Vascepa, atorvastatin, and Ozempic for the last 2 months due to decreased motivation Refilled Vascepa, Repatha, and atorvastatin. Patient is asked to call her provider to refill Ozempic. Recent echocardiogram results reviewed independently without at today's visit.  Chronic HFrEF, stage C, NYHA Class II Appears overall euvolemic on physical examination. Has gained approximately 7 pounds due to poor dietary choices. Medications reconciled. Educated her on the importance of secondary prevention as discussed above. Continue Entresto and Toprol-XL Reiterated the importance of fluid restriction, sodium restriction, and monitoring of daily weights. Currently in principle care management for HF management. Of note, Wilder Glade has been discontinued given frequent UTIs.  Pure hypercholesterolemia Last lipid profile was in June 2023, patient is unsure if this was a fasting sample.  As of June 2023 patient's LDL level was 238 mg/dL Likely secondary to stopping atorvastatin, Vascepa, and Repatha as discussed above We will recheck fasting lipid profile to obtain a new baseline. After getting a blood work patient is asked to restart her medical therapy.  Atrial fibrillation, paroxysmal: Rate control: metoprolol Rhythm control: N/A Thromboembolic prophylaxis: Currently on coumadin goal INR 2.5 to 3.5. CHA2DS2-VASc SCORE is 6 which correlates to 9.8 % risk of stroke per year. Check hemoglobin hematocrit. Scheduled for INR check in 4 weeks.  Long--term oral anticoagulation:  Indication: Paroxysmal atrial fibrillation and mechanical mitral valve. Currently on  Coumadin. Management as per above   Status post [facility] Jude mechanical mitral valve secondary to rheumatic mitral valve stenosis in  March 2002: Continue coumadin. She will need antibiotic prophylaxis prior to the procedure.  Patient verbalized understanding.  Cigarette smoker:  Previously she quit smoking in November 2022. However, she started smoking again as a part of coping mechanism. Educated her on the importance of complete smoking cessation. Tobacco cessation counseling: Currently smoking 1 packs/day   Patient is willing to quit at this time. 7 mins were spent counseling patient cessation techniques. We discussed various methods to help quit smoking, including deciding on a date to quit, joining a support group, pharmacological agents- nicotine gum/patch/lozenges.  I will reassess her progress at the next follow-up visit  FINAL MEDICATION LIST END OF ENCOUNTER: Meds ordered this encounter  Medications   VASCEPA 1 g capsule    Sig: Take 2 capsules (2 g total) by mouth 2 (two) times daily.    Dispense:  120 capsule    Refill:  2   Evolocumab (REPATHA SURECLICK) 568 MG/ML SOAJ    Sig: Inject 140 mg into the skin every 14 (fourteen) days for 6 doses.    Dispense:  2 mL    Refill:  3   atorvastatin (LIPITOR) 80 MG tablet    Sig: Take 1 tablet (80 mg total) by mouth at bedtime.    Dispense:  90 tablet    Refill:  0     Current Outpatient  Medications:    Accu-Chek FastClix Lancets MISC, Apply topically daily., Disp: , Rfl:    ACCU-CHEK GUIDE test strip, daily. as directed, Disp: , Rfl:    albuterol (VENTOLIN HFA) 108 (90 Base) MCG/ACT inhaler, Inhale 1 puff into the lungs every 6 (six) hours as needed for wheezing or shortness of breath., Disp: , Rfl:    atorvastatin (LIPITOR) 80 MG tablet, Take 1 tablet (80 mg total) by mouth at bedtime., Disp: 90 tablet, Rfl: 0   busPIRone (BUSPAR) 10 MG tablet, Take 1 tablet by mouth in the morning and at bedtime., Disp: , Rfl:     clopidogrel (PLAVIX) 75 MG tablet, Take 1 tablet (75 mg total) by mouth daily., Disp: 90 tablet, Rfl: 2   Diclofenac Sodium 3 % GEL, Apply topically., Disp: , Rfl:    DULoxetine (CYMBALTA) 30 MG capsule, Take 60 mg by mouth daily., Disp: , Rfl:    ENTRESTO 49-51 MG, TAKE ONE TABLET BY MOUTH TWICE DAILY, Disp: 60 tablet, Rfl: 5   ferrous sulfate 325 (65 FE) MG EC tablet, Take 1 tablet by mouth daily at 12 noon., Disp: , Rfl:    gabapentin (NEURONTIN) 600 MG tablet, Take 1 capsule by mouth 3 (three) times daily. , Disp: , Rfl:    magnesium oxide (MAG-OX) 400 MG tablet, Take 1 tablet by mouth daily at 12 noon., Disp: , Rfl:    metoprolol succinate (TOPROL-XL) 25 MG 24 hr tablet, TAKE ONE TABLET BY MOUTH ONCE DAILY, Disp: 90 tablet, Rfl: 2   naloxone (NARCAN) nasal spray 4 mg/0.1 mL, SMARTSIG:1 Spray(s) Both Nares 1 to 2 Times Daily, Disp: , Rfl:    omeprazole (PRILOSEC) 40 MG capsule, Take 1 capsule by mouth daily., Disp: , Rfl:    oxyCODONE-acetaminophen (PERCOCET) 10-325 MG tablet, Take by mouth. Take 1 tablet by mouth in the morning and 1 tablet at noon and 1 tablet in the evening and 1 tablet before bedtime, Disp: , Rfl:    prazosin (MINIPRESS) 1 MG capsule, Take 1 mg by mouth at bedtime., Disp: , Rfl:    QUEtiapine (SEROQUEL) 300 MG tablet, Take by mouth at bedtime. 1 and 1/2 tablet daily, Disp: , Rfl:    Semaglutide,0.25 or 0.5MG/DOS, (OZEMPIC, 0.25 OR 0.5 MG/DOSE,) 2 MG/1.5ML SOPN, Inject 0.5 mg into the skin once a week., Disp: , Rfl:    warfarin (COUMADIN) 3 MG tablet, TAKE TWO TABLETS BY MOUTH each DAY AT 4pm OR AS DIRECTED by coumadin clinic, Disp: 480 tablet, Rfl: 0   Evolocumab (REPATHA SURECLICK) 315 MG/ML SOAJ, Inject 140 mg into the skin every 14 (fourteen) days for 6 doses., Disp: 2 mL, Rfl: 3   furosemide (LASIX) 20 MG tablet, Take 1 tablet (20 mg total) by mouth daily as needed (shortness of breath, swelling, weight gain). (Patient not taking: Reported on 09/28/2021), Disp: 30 tablet,  Rfl: 0   VASCEPA 1 g capsule, Take 2 capsules (2 g total) by mouth 2 (two) times daily., Disp: 120 capsule, Rfl: 2  Orders Placed This Encounter  Procedures   Lipid Panel With LDL/HDL Ratio   Lipoprotein A (LPA)   LDL cholesterol, direct   Apo A1 + B + Ratio   CMP14+EGFR   Hemoglobin and hematocrit, blood    There are no Patient Instructions on file for this visit.   --Continue cardiac medications as reconciled in final medication list. --Return in about 3 months (around 12/29/2021) for Follow up, heart failure management, cardiomyopathy, mechanical mitral valve . Or sooner if  needed. --Continue follow-up with your primary care physician regarding the management of your other chronic comorbid conditions.  Patient's questions and concerns were addressed to her satisfaction. She voices understanding of the instructions provided during this encounter.   This note was created using a voice recognition software as a result there may be grammatical errors inadvertently enclosed that do not reflect the nature of this encounter. Every attempt is made to correct such errors.   Rex Kras, [place], [facility]  Pager: [phone] Office: [phone]

## 2021-09-28 NOTE — Progress Notes (Signed)
ICD-10-CM   1. S/P mitral valve replacement  Z95.2 POCT INR    2. Paroxysmal atrial fibrillation (HCC)  I48.0 POCT INR    3. Monitoring for long-term anticoagulant use  Z51.81    Z79.01      Anticoagulation Summary  As of 09/27/2021    INR goal:  2.5-3.5  TTR:  50.1 % (2.2 y)  INR used for dosing:  3.5 (09/27/2021)  Warfarin maintenance plan:  3 mg (3 mg x 1) every Tue, Thu; 4.5 mg (3 mg x 1.5) all other days  Weekly warfarin total:  28.5 mg  Plan last modified:  Erby Pian, CMA (08/30/2021)  Next INR check:  10/27/2021  Target end date:  Indefinite   Indications   S/P mitral valve replacement [Z95.2] Paroxysmal atrial fibrillation (HCC) [I48.0] Monitoring for long-term anticoagulant use [Z51.81 Z79.01]          Anticoagulation Episode Summary     INR check location:     Preferred lab:     Send INR reminders to:     Comments:        INR is at goal. 3.5 3 mg Tuesdays and Thursdays  4.5 mg all other days. Continue present dose  Recheck INR in 4 weeks 10/27/2021

## 2021-10-06 ENCOUNTER — Other Ambulatory Visit: Payer: Self-pay

## 2021-10-06 DIAGNOSIS — I5022 Chronic systolic (congestive) heart failure: Secondary | ICD-10-CM

## 2021-10-06 DIAGNOSIS — I255 Ischemic cardiomyopathy: Secondary | ICD-10-CM

## 2021-10-06 DIAGNOSIS — Z955 Presence of coronary angioplasty implant and graft: Secondary | ICD-10-CM

## 2021-10-06 DIAGNOSIS — I2510 Atherosclerotic heart disease of native coronary artery without angina pectoris: Secondary | ICD-10-CM

## 2021-10-06 MED ORDER — VASCEPA 1 G PO CAPS: 2.0000 g | ORAL_CAPSULE | Freq: Two times a day (BID) | ORAL | 2 refills | Status: DC

## 2021-10-06 NOTE — Telephone Encounter (Signed)
Vascepa PA approved thru [employer] for 90 days supply

## 2021-10-11 LAB — CMP14+EGFR
ALT: 7 IU/L (ref 0–32)
AST: 17 IU/L (ref 0–40)
Albumin/Globulin Ratio: 1.4 (ref 1.2–2.2)
Albumin: 4.1 g/dL (ref 3.9–4.9)
Alkaline Phosphatase: 101 IU/L (ref 44–121)
BUN/Creatinine Ratio: 8 — ABNORMAL LOW (ref 12–28)
BUN: 10 mg/dL (ref 8–27)
Bilirubin Total: 0.2 mg/dL (ref 0.0–1.2)
CO2: 22 mmol/L (ref 20–29)
Calcium: 9 mg/dL (ref 8.7–10.3)
Chloride: 105 mmol/L (ref 96–106)
Creatinine, Ser: 1.2 mg/dL — ABNORMAL HIGH (ref 0.57–1.00)
Globulin, Total: 3 g/dL (ref 1.5–4.5)
Glucose: 121 mg/dL — ABNORMAL HIGH (ref 70–99)
Potassium: 3.9 mmol/L (ref 3.5–5.2)
Sodium: 142 mmol/L (ref 134–144)
Total Protein: 7.1 g/dL (ref 6.0–8.5)
eGFR: 51 mL/min/{1.73_m2} — ABNORMAL LOW (ref >59)

## 2021-10-11 LAB — LIPID PANEL WITH LDL/HDL RATIO
Cholesterol, Total: 274 mg/dL — ABNORMAL HIGH (ref 100–199)
HDL: 43 mg/dL (ref >39)
LDL Chol Calc (NIH): 184 mg/dL — ABNORMAL HIGH (ref 0–99)
LDL/HDL Ratio: 4.3 ratio — ABNORMAL HIGH (ref 0.0–3.2)
Triglycerides: 245 mg/dL — ABNORMAL HIGH (ref 0–149)
VLDL Cholesterol Cal: 47 mg/dL — ABNORMAL HIGH (ref 5–40)

## 2021-10-11 LAB — LDL CHOLESTEROL, DIRECT: LDL Direct: 175 mg/dL — ABNORMAL HIGH (ref 0–99)

## 2021-10-11 LAB — APO A1 + B + RATIO
Apolipo. B/A-1 Ratio: 1 ratio — ABNORMAL HIGH (ref 0.0–0.6)
Apolipoprotein A-1: 149 mg/dL (ref 116–209)
Apolipoprotein B: 147 mg/dL — ABNORMAL HIGH (ref <90)

## 2021-10-11 LAB — LIPOPROTEIN A (LPA): Lipoprotein (a): 648.6 nmol/L — ABNORMAL HIGH

## 2021-10-11 LAB — HEMOGLOBIN AND HEMATOCRIT, BLOOD
Hematocrit: 40.7 % (ref 34.0–46.6)
Hemoglobin: 13.4 g/dL (ref 11.1–15.9)

## 2021-10-18 ENCOUNTER — Telehealth: Payer: Self-pay

## 2021-10-18 NOTE — Telephone Encounter (Signed)
Completed a cardiac medication reconciliation with patient on the phone.  Patient reports to not taking x50mo atorvastatin and Repatha before August visit with Dr. Gwyndolyn Kaufman labs. Patient confirms she is now taking both.

## 2021-10-19 ENCOUNTER — Other Ambulatory Visit: Payer: Self-pay

## 2021-10-19 DIAGNOSIS — E785 Hyperlipidemia, unspecified: Secondary | ICD-10-CM

## 2021-10-19 NOTE — Progress Notes (Signed)
Labs for visit that is scheduled on 12/07/2021

## 2021-10-26 ENCOUNTER — Ambulatory Visit: Payer: Medicaid Other | Admitting: Cardiology

## 2021-10-26 DIAGNOSIS — Z5181 Encounter for therapeutic drug level monitoring: Secondary | ICD-10-CM

## 2021-10-26 DIAGNOSIS — Z952 Presence of prosthetic heart valve: Secondary | ICD-10-CM

## 2021-10-26 DIAGNOSIS — I480 Paroxysmal atrial fibrillation: Secondary | ICD-10-CM

## 2021-10-26 LAB — POCT INR: INR: 3.8 — AB (ref 2.0–3.0)

## 2021-10-27 NOTE — Progress Notes (Unsigned)
  Anticoagulation Episode Summary     Current INR goal:  2.5-3.5  TTR:  48.4 % (2.3 y)  Next INR check:  11/06/2021  INR from last check:  3.8 (10/26/2021)  Weekly max warfarin dose:    Target end date:  Indefinite  INR check location:    Preferred lab:    Send INR reminders to:     Indications   S/P mitral valve replacement [Z95.2] Paroxysmal atrial fibrillation (HCC) [I48.0] Monitoring for long-term anticoagulant use [Z51.81 Z79.01]        Comments:          Description   10/26/2021   INR is not at goal level (2.5-3.5).  INR today was 3.8.   Patient previously on 4.5 mg dose on a daily basis except Tuesdays and Thursdays all 3 mg.  Patient will hold tomorrow (10/27/21). Patient will start 4.5 mg Sun, Tues, and Fri, and 3 mg all the other days. Recheck INR in 10 days.    Lab Results      Component                Value               Date                      INR                      3.8 (A)             10/26/2021                INR                      3.5 (A)             09/27/2021                INR                      3.5 (A)             08/30/2021

## 2021-10-30 ENCOUNTER — Other Ambulatory Visit: Payer: Self-pay | Admitting: Cardiology

## 2021-10-30 DIAGNOSIS — Z955 Presence of coronary angioplasty implant and graft: Secondary | ICD-10-CM

## 2021-10-31 NOTE — Progress Notes (Signed)
Called patient, Na, LMAM.

## 2021-11-03 NOTE — Progress Notes (Signed)
Called and spoke to patient she voiced understanding patient stated she is taking all medication except Vascepa probably patient needs PA I will contact pharmacy to follow up. Patient said it has not been 6 weeks yet but she will get labs done after 6 weeks

## 2021-11-07 ENCOUNTER — Ambulatory Visit: Payer: Medicaid Other | Admitting: Cardiology

## 2021-11-07 DIAGNOSIS — Z7901 Long term (current) use of anticoagulants: Secondary | ICD-10-CM

## 2021-11-07 DIAGNOSIS — I480 Paroxysmal atrial fibrillation: Secondary | ICD-10-CM

## 2021-11-07 DIAGNOSIS — Z952 Presence of prosthetic heart valve: Secondary | ICD-10-CM

## 2021-11-07 DIAGNOSIS — E785 Hyperlipidemia, unspecified: Secondary | ICD-10-CM

## 2021-11-07 LAB — POCT INR: INR: 3 (ref 2.0–3.0)

## 2021-11-07 MED ORDER — EVOLOCUMAB 140 MG/ML ~~LOC~~ SOAJ
140.0000 mg | Freq: Once | SUBCUTANEOUS | Status: AC
  Administered 2021-11-07: 140 mg via SUBCUTANEOUS

## 2021-11-21 ENCOUNTER — Ambulatory Visit: Payer: Medicaid Other | Admitting: Cardiology

## 2021-11-21 DIAGNOSIS — Z7901 Long term (current) use of anticoagulants: Secondary | ICD-10-CM

## 2021-11-21 DIAGNOSIS — I480 Paroxysmal atrial fibrillation: Secondary | ICD-10-CM

## 2021-11-21 DIAGNOSIS — Z955 Presence of coronary angioplasty implant and graft: Secondary | ICD-10-CM

## 2021-11-21 DIAGNOSIS — Z952 Presence of prosthetic heart valve: Secondary | ICD-10-CM

## 2021-11-21 DIAGNOSIS — E785 Hyperlipidemia, unspecified: Secondary | ICD-10-CM

## 2021-11-21 DIAGNOSIS — Z5181 Encounter for therapeutic drug level monitoring: Secondary | ICD-10-CM

## 2021-11-21 LAB — CMP14+EGFR
ALT: 13 IU/L (ref 0–32)
AST: 17 IU/L (ref 0–40)
Albumin/Globulin Ratio: 1.5 (ref 1.2–2.2)
Albumin: 4.1 g/dL (ref 3.9–4.9)
Alkaline Phosphatase: 113 IU/L (ref 44–121)
BUN/Creatinine Ratio: 8 — ABNORMAL LOW (ref 12–28)
BUN: 8 mg/dL (ref 8–27)
Bilirubin Total: 0.3 mg/dL (ref 0.0–1.2)
CO2: 22 mmol/L (ref 20–29)
Calcium: 9.2 mg/dL (ref 8.7–10.3)
Chloride: 105 mmol/L (ref 96–106)
Creatinine, Ser: 1.06 mg/dL — ABNORMAL HIGH (ref 0.57–1.00)
Globulin, Total: 2.8 g/dL (ref 1.5–4.5)
Glucose: 132 mg/dL — ABNORMAL HIGH (ref 70–99)
Potassium: 4.4 mmol/L (ref 3.5–5.2)
Sodium: 141 mmol/L (ref 134–144)
Total Protein: 6.9 g/dL (ref 6.0–8.5)
eGFR: 59 mL/min/{1.73_m2} — ABNORMAL LOW (ref >59)

## 2021-11-21 LAB — LIPID PANEL WITH LDL/HDL RATIO
Cholesterol, Total: 165 mg/dL (ref 100–199)
HDL: 44 mg/dL (ref >39)
LDL Chol Calc (NIH): 96 mg/dL (ref 0–99)
LDL/HDL Ratio: 2.2 ratio (ref 0.0–3.2)
Triglycerides: 143 mg/dL (ref 0–149)
VLDL Cholesterol Cal: 25 mg/dL (ref 5–40)

## 2021-11-21 LAB — POCT INR: INR: 3.9 — AB (ref 2.0–3.0)

## 2021-11-21 LAB — APO A1 + B + RATIO
Apolipo. B/A-1 Ratio: 0.6 ratio (ref 0.0–0.6)
Apolipoprotein A-1: 144 mg/dL (ref 116–209)
Apolipoprotein B: 93 mg/dL — ABNORMAL HIGH (ref <90)

## 2021-11-21 LAB — LDL CHOLESTEROL, DIRECT: LDL Direct: 97 mg/dL (ref 0–99)

## 2021-11-21 LAB — LIPOPROTEIN A (LPA): Lipoprotein (a): 552 nmol/L — ABNORMAL HIGH (ref <75.0)

## 2021-11-21 MED ORDER — SEMAGLUTIDE(0.25 OR 0.5MG/DOS) 2 MG/3ML ~~LOC~~ SOPN
1.0000 mL | PEN_INJECTOR | Freq: Once | SUBCUTANEOUS | Status: AC
  Administered 2021-11-21: 1 mL via SUBCUTANEOUS

## 2021-11-21 MED ORDER — EVOLOCUMAB 140 MG/ML ~~LOC~~ SOAJ
140.0000 mg | Freq: Once | SUBCUTANEOUS | Status: AC
  Administered 2021-11-21: 140 mg via SUBCUTANEOUS

## 2021-11-21 NOTE — Progress Notes (Signed)
Anti-Coagulation Progress Note  Joanna Martinez is a 61 y.o. female who is currently on an anti-coagulation regimen for atrial fibrillation and mechanical mitral valve  RECENT RESULTS: Recent results are below, the most recent result is correlated with a dose of 25.5 mg. per week:  ANTI-COAG DOSE: Description   11/21/2021  INR today was 3.9. Goal 2.5-3.5.  Patient does 4.5 mg Sun, Tues, and Fri, and 3 mg all the other days. Recheck INR in 4 weeks.  Lab Results      Component                Value               Date                      INR                      3.9 (A)             11/21/2021                INR                      3.0                 11/07/2021                INR                      3.8 (A)             10/26/2021                   ASSESSMENT:   ICD-10-CM   1. S/P mitral valve replacement  Z95.2 POCT INR    2. Paroxysmal atrial fibrillation (HCC)  I48.0     3. Monitoring for long-term anticoagulant use  Z51.81    Z79.01     4. Hyperlipidemia, unspecified hyperlipidemia type  E78.5 Evolocumab SOAJ 140 mg    5. History of coronary angioplasty with insertion of stent  Z95.5 Semaglutide(0.25 or 0.5MG /DOS) SOPN 1 mL       PLAN: INR is 3.9 today. Continue current regimen. Recheck in 4 weeks.  Rex Kras, [place], [facility]  Pager: [phone] Office: [phone]

## 2021-11-23 NOTE — Progress Notes (Signed)
Reminded patient on 9/21 to do labs next week for 10/5 appointment and to bring medications.

## 2021-12-07 ENCOUNTER — Encounter: Payer: Self-pay | Admitting: Cardiology

## 2021-12-07 ENCOUNTER — Ambulatory Visit: Payer: Medicaid Other | Admitting: Cardiology

## 2021-12-07 VITALS — BP 131/64 | HR 72 | Temp 97.1°F | Resp 16 | Ht 60.0 in | Wt 199.4 lb

## 2021-12-07 DIAGNOSIS — E7800 Pure hypercholesterolemia, unspecified: Secondary | ICD-10-CM

## 2021-12-07 DIAGNOSIS — Z7901 Long term (current) use of anticoagulants: Secondary | ICD-10-CM

## 2021-12-07 DIAGNOSIS — F17210 Nicotine dependence, cigarettes, uncomplicated: Secondary | ICD-10-CM

## 2021-12-07 DIAGNOSIS — Z952 Presence of prosthetic heart valve: Secondary | ICD-10-CM

## 2021-12-07 DIAGNOSIS — I2510 Atherosclerotic heart disease of native coronary artery without angina pectoris: Secondary | ICD-10-CM

## 2021-12-07 DIAGNOSIS — Z5181 Encounter for therapeutic drug level monitoring: Secondary | ICD-10-CM

## 2021-12-07 DIAGNOSIS — Z792 Long term (current) use of antibiotics: Secondary | ICD-10-CM

## 2021-12-07 DIAGNOSIS — Z8673 Personal history of transient ischemic attack (TIA), and cerebral infarction without residual deficits: Secondary | ICD-10-CM

## 2021-12-07 DIAGNOSIS — I480 Paroxysmal atrial fibrillation: Secondary | ICD-10-CM

## 2021-12-07 DIAGNOSIS — I5022 Chronic systolic (congestive) heart failure: Secondary | ICD-10-CM

## 2021-12-07 DIAGNOSIS — I255 Ischemic cardiomyopathy: Secondary | ICD-10-CM

## 2021-12-07 LAB — POCT INR: INR: 2.9 (ref 2–3)

## 2021-12-07 NOTE — Progress Notes (Signed)
Joanna Martinez Date of Birth: [birth year] MRN: [id number] Primary Care Provider:[place], Alfonso Ramus, [place] Former Cardiology Providers: Dr. Thayer Jew [phone] Primary Cardiologist: Rex Kras, DO, [facility] (established care 07/02/2019)  Date: 12/07/21 Last Office Visit: 09/28/2021  Chief Complaint  Patient presents with    heart failure management   Cardiomyopathy   Follow-up    HPI  Joanna Martinez is a 61 y.o. 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   Patient is being followed by the practice given her underlying CAD prior PCI to the LCx, mechanical mitral valve replacement in March 2002 at [facility], and paroxysmal atrial fibrillation.  Since last office visit patient states that she is more compliant with medical therapy and has restarted Repatha as well as Ozempic and other GDMT medications.  She is tolerating them well without any side effects or intolerances. Repeat labs noted significant improvement in her lipids.  With regards to anticoagulation she is currently on Coumadin with a goal INR between 2.5-3.5 due to her mechanical mitral valve & PAF.  Today's INR was 2.9.  She does not endorse evidence of bleeding.  She has been on the same regimen for some time and her INR is usually greater than 3.  Patient states that she has had decreased oral intake in the recent past due to GI symptoms.  She is not sure if is due to organic issues versus side effects from [place].  FUNCTIONAL  STATUS: No exercise or daily routine.    ALLERGIES: Allergies  Allergen Reactions   Nitrofurantoin     Other reaction(s): Other (see comments)   Hydrocodone-Acetaminophen Rash    Other reaction(s): Other (See Comments)   Levofloxacin Rash    Other reaction(s): Other (see comments)   Sulfamethoxazole-Trimethoprim Rash    Other reaction(s): Other (see comments)    MEDICATION LIST PRIOR TO VISIT: Current Meds  Medication Sig   Accu-Chek FastClix Lancets MISC Apply topically daily.   ACCU-CHEK GUIDE test strip daily. as directed   albuterol (VENTOLIN HFA) 108 (90 Base) MCG/ACT inhaler Inhale 1 puff into the lungs every 6 (six) hours as needed for wheezing or shortness of breath.   atorvastatin (LIPITOR) 80 MG tablet Take 1 tablet (80 mg total) by mouth at bedtime.   busPIRone (BUSPAR) 10 MG tablet Take 1 tablet by mouth in the morning and at bedtime.   clopidogrel (PLAVIX) 75 MG tablet TAKE ONE TABLET BY MOUTH EVERY MORNING   Diclofenac Sodium 3 % GEL Apply topically.   DULoxetine (CYMBALTA) 30 MG capsule Take 60 mg by mouth daily.   ENTRESTO 49-51 MG TAKE ONE TABLET BY MOUTH TWICE DAILY   Evolocumab (REPATHA SURECLICK) 325 MG/ML SOAJ Inject 140 mg into the skin every 14 (fourteen) days for 6 doses.   ferrous sulfate 325 (65 FE) MG EC tablet Take 1 tablet by mouth daily at 12 noon.   furosemide (LASIX) 20 MG tablet Take 1 tablet (20 mg total) by mouth daily as needed (shortness of breath, swelling, weight gain).   gabapentin (NEURONTIN) 600 MG tablet Take 1 capsule by mouth 3 (three) times daily.    magnesium  oxide (MAG-OX) 400 MG tablet Take 1 tablet by mouth daily at 12 noon.   metoprolol succinate (TOPROL-XL) 25 MG 24 hr tablet TAKE ONE TABLET BY MOUTH ONCE DAILY   naloxone (NARCAN) nasal spray 4 mg/0.1 mL SMARTSIG:1 Spray(s) Both Nares 1 to 2 Times Daily   omeprazole (PRILOSEC) 40 MG capsule Take 1 capsule by mouth daily.   oxyCODONE-acetaminophen (PERCOCET) 10-325 MG tablet Take  by mouth. Take 1 tablet by mouth in the morning and 1 tablet at noon and 1 tablet in the evening and 1 tablet before bedtime   prazosin (MINIPRESS) 1 MG capsule Take 1 mg by mouth at bedtime.   QUEtiapine (SEROQUEL) 300 MG tablet Take by mouth at bedtime. 1 and 1/2 tablet daily   Semaglutide,0.25 or 0.5MG/DOS, (OZEMPIC, 0.25 OR 0.5 MG/DOSE,) 2 MG/1.5ML SOPN Inject 0.5 mg into the skin once a week.   VASCEPA 1 g capsule Take 2 capsules (2 g total) by mouth 2 (two) times daily.   warfarin (COUMADIN) 3 MG tablet TAKE TWO TABLETS BY MOUTH each DAY AT 4pm OR AS DIRECTED by coumadin clinic     PAST MEDICAL HISTORY: Past Medical History:  Diagnosis Date   Asthma    Cardiomyopathy ([place])    CHF (congestive heart failure) (HCC)    Chronic kidney disease    COPD (chronic obstructive pulmonary disease) (HCC)    Coronary artery disease    H/O heart valve replacement with mechanical valve    Heart attack (HCC)    Hyperlipidemia    Hypertension    Paroxysmal atrial fibrillation (HCC)    Stroke ([place])     PAST SURGICAL HISTORY: Past Surgical History:  Procedure Laterality Date   CARDIAC SURGERY     CORONARY ANGIOPLASTY WITH STENT PLACEMENT     MITRAL VALVE SURGERY      FAMILY HISTORY: The patient family history includes Diabetes in her brother, brother, and sister; HIV/AIDS in her father and mother; Hyperlipidemia in her brother, brother, and sister; Hypertension in her brother, brother, and sister.  SOCIAL HISTORY:  The patient  reports that she has been smoking cigarettes. She has been smoking an average of .5 packs per day. She has never used smokeless tobacco. She reports that she does not currently use alcohol. She reports that she does not use drugs.  REVIEW OF SYSTEMS: Review of Systems  Constitutional: Positive for decreased appetite and weight loss. Negative for chills and fever.  HENT:  Negative for hoarse voice and nosebleeds.   Eyes:  Negative for discharge, double vision and  pain.  Cardiovascular:  Positive for dyspnea on exertion (improved). Negative for chest pain, claudication, leg swelling, near-syncope, orthopnea, palpitations, paroxysmal nocturnal dyspnea and syncope.  Respiratory:  Negative for hemoptysis and shortness of breath.   Musculoskeletal:  Negative for muscle cramps and myalgias.  Gastrointestinal:  Positive for diarrhea. Negative for abdominal pain, constipation, hematemesis, hematochezia, melena, nausea and vomiting.  Neurological:  Negative for dizziness, focal weakness, light-headedness, loss of balance and paresthesias.    PHYSICAL EXAM:    12/07/2021   11:55 AM 09/28/2021    2:02 PM 08/07/2021    9:38 AM  Vitals with BMI  Height 5' 0" 5' 0" 5' 0"  Weight 199 lbs 6 oz 206 lbs 199 lbs  BMI 38.94 74.12 87.86  Systolic 767 209 92  Diastolic 64 68 65  Pulse 72 78 79   Physical Exam  Constitutional: No distress.  Age appropriate, hemodynamically stable.   HENT:  Arcus senilis  Neck: No JVD present.  Cardiovascular: Normal rate, regular rhythm, S1 normal, S2 normal, intact distal pulses and normal pulses. Exam reveals no gallop, no S3 and no S4.  No murmur heard. Mechanical click  Pulmonary/Chest: Effort normal and breath sounds normal. No stridor. She has no wheezes. She has no rales.  Abdominal: Soft. Bowel sounds are normal. She exhibits no distension. There is no abdominal tenderness.  Musculoskeletal:        General: No edema.     Cervical back: Neck supple.  Neurological: She is alert and oriented to person, place, and time. She has intact cranial nerves (2-12).  Skin: Skin is warm and moist.   CARDIAC DATABASE: Status post SJM mechanical mitral valve placement in March 2002 at [facility] due to rheumatic mitral stenosis.  EKG: 07/02/2019: Sinus rhythm, 94 bpm, normal axis, incomplete right bundle branch block, left atrial enlargement, nonspecific T wave abnormality.   06/03/2020: Normal sinus rhythm, 66 bpm, left axis  deviation, without underlying injury pattern, occasional PVCs.  02/03/2021: NSR, 78 bpm, left axis, left anterior fascicular block, without underlying injury pattern.  08/07/2021: Sinus rhythm with single PVC at a rate of 80 bpm.  Left axis, left anterior fascicular block.  Nonspecific T wave abnormality.  Echocardiogram: 08/30/2021:  Left ventricle cavity is normal in size. Mild concentric hypertrophy of the left ventricle. Abnormal septal wall motion due to post-operative valve. Moderate global hypokinesis. LVEF 35-40%. Indeterminate diastolic filling pattern.  Left atrial cavity is severely dilated.  S/p St Jude mechanical mitral valve. Well seated valve. No significant prosthetic valve stenosis. Mean PG 3 mmHg, MVA 2.1 cm2 by PHT method.   Trace mitral regurgitation.  No evidence of pulmonary hypertension.  Compared to previous study on 07/25/2020, no significant change noted.   Stress Testing: Outside records requested.   Heart Catheterization: November 2019 per report: Proximal LAD 20%. LCx stent patent.  Discrete 20% narrowing noted distal to the stent.  A separate 30% stenosis was noted in the distal LCx. RCA: Diffuse 20-30% narrowing in the proximal/mid/distal segments. Posterior atrioventricular branch 50 to 60% stenosis in its distal segment prior to the origin of the most distal posterolateral branch. Right PDA smooth tubular lesions with 60 to 70% luminal narrowing.    Lower Extremity Venous Duplex Left leg 06/03/2020: No evidence of deep vein thrombosis of the left lower extremity with normal venous return.  LABORATORY DATA:    Latest Ref Rng & Units 10/05/2021    9:13 AM 09/01/2021    9:18 AM 08/18/2021    9:47 AM  CBC  Hemoglobin 11.1 - 15.9 g/dL 13.4  12.6  12.9   Hematocrit 34.0 - 46.6 % 40.7  38.2  37.8        Latest Ref Rng & Units 11/20/2021    9:18 AM 10/05/2021    9:13 AM 09/01/2021    9:18 AM  CMP  Glucose 70 - 99 mg/dL 132  121  129   BUN 8 - 27 mg/dL _0 Creatinine 0.57 - 1.00 mg/dL 1.06  1.20  1.18   Sodium 134 - 144 mmol/L 141  142  140   Potassium 3.5 - 5.2 mmol/L 4.4  3.9  4.2   Chloride 96 - 106 mmol/L 105  105  103   CO2 20 - 29 mmol/L _1 Calcium 8.7 - 10.3 mg/dL 9.2  9.0  9.7   Total Protein 6.0 - 8.5 g/dL  6.9  7.1  7.1   Total Bilirubin 0.0 - 1.2 mg/dL 0.3  0.2  0.2   Alkaline Phos 44 - 121 IU/L 113  101  95   AST 0 - 40 IU/L _0 ALT 0 - 32 IU/L _1 Lipid Panel     Component Value Date/Time   CHOL 165 11/20/2021 0917   TRIG 143 11/20/2021 0917   HDL 44 11/20/2021 0917   LDLCALC 96 11/20/2021 0917   LDLDIRECT 97 11/20/2021 0919   LABVLDL 25 11/20/2021 0917    No results found for: "HGBA1C" No components found for: "NTPROBNP" No results found for: "TSH"  BMP Recent Labs    09/01/21 0918 10/05/21 0913 11/20/21 0918  NA 140 142 141  K 4.2 3.9 4.4  CL 103 105 105  CO2 _2 GLUCOSE 129* 121* 132*  BUN _3 CREATININE 1.18* 1.20* 1.06*  CALCIUM 9.7 9.0 9.2    CBC No results for input(s): "WBC", "RBC", "HGB", "HCT", "PLT", "MCV", "MCH", "MCHC", "RDW", "LYMPHSABS", "MONOABS", "EOSABS", "BASOSABS" in the last 168 hours.  Invalid input(s): "NEUTRABS"  HEMOGLOBIN A1C No results found for: "HGBA1C", "MPG"  Cardiac Panel (last 3 results) No results for input(s): "CKTOTAL", "CKMB", "TROPONINI", "RELINDX" in the last 8760 hours. No results for input(s): "TROPIPOC" in the last 8760 hours.  BNP (last 3 results) Recent Labs    08/18/21 0947 09/01/21 0902  PROBNP 288* 213    TSH No results for input(s): "TSH" in the last 8760 hours.  CHOLESTEROL Recent Labs    09/01/21 0918 10/05/21 0913 11/20/21 0917  CHOL 351* 274* 165     Hepatic Function Panel Recent Labs    09/01/21 0918 10/05/21 0913 11/20/21 0918  PROT 7.1 7.1 6.9  ALBUMIN 4.1 4.1 4.1  AST _4 ALT _5 ALKPHOS 95 101 113  BILITOT 0.2 0.2 0.3     External Labs: Collected:  06/10/2019 Creatinine 1.06 mg/dL. eGFR: 66 mL/min per 1.73 m Lipid profile: Total cholesterol 304, triglycerides 310, HDL 53, LDL 190 Hemoglobin A1c: 6.3 TSH: 1.26 and T4 0.68 (free)  IMPRESSION:    ICD-10-CM   1. Monitoring for long-term anticoagulant use  Z51.81 POCT INR   Z79.01        RECOMMENDATIONS: Lynesha Bango is a 61 y.o. 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  Atherosclerosis of native coronary artery of native heart without angina pectoris / Ischemic cardiomyopathy Denies angina pectoris. No use of sublingual nitroglycerin tablets Slowly reducing the amount of cigarette smoking Reemphasized importance of secondary prevention Repeat fasting lipid profile notes improvement in LDL/triglycerides. Reemphasized the importance of medication compliance No medications refilled at today's office visit. Monitor for now  Chronic HFrEF (heart failure with reduced ejection fraction) ([place]) Euvolemic. Stage C, NYHA class II 6 pounds of weight loss since last visit Medications reconciled Strict I's and O's and daily weights, low-salt diet Patient is asked to call the office if she gains more than 1 pound over 24 hours with 3  pounds over the course of a week. In the past Wilder Glade was discontinued due to frequent UTIs Enrolled into principal care management.  S/P mitral valve replacement  / Need for prophylactic antibiotic / Monitoring for long-term anticoagulant use Mechanical click noted on  physical examination. INR today is 2.9. Continue 4.5 mg of Coumadin Sunday/Tuesday/Friday and 3 mg rest of the days. Recheck INR on 12/18/2021  Paroxysmal atrial fibrillation ([place]) / Long term (current) use of anticoagulants Rate control: Metoprolol. Rhythm control: N/A. Thromboembolic prophylaxis: Coumadin with a goal INR 2.5-3.5 CHA2DS2-VASc SCORE is 6 which correlates to 9.8% risk of stroke per year.  Pure hypercholesterolemia Improving. Continue Vascepa, Repatha, atorvastatin Does not endorse myalgias.  Cigarette smoker Tobacco cessation counseling: Currently smoking 8 cigarettes/day Patient is willing to quit at this time. 7 mins were spent counseling patient cessation techniques. We discussed various methods to help quit smoking, including deciding on a date to quit, joining a support group, pharmacological agents- nicotine gum/patch/lozenges. I will reassess her progress at the next follow-up visit   FINAL MEDICATION LIST END OF ENCOUNTER: No orders of the defined types were placed in this encounter.    Current Outpatient Medications:    Accu-Chek FastClix Lancets MISC, Apply topically daily., Disp: , Rfl:    ACCU-CHEK GUIDE test strip, daily. as directed, Disp: , Rfl:    albuterol (VENTOLIN HFA) 108 (90 Base) MCG/ACT inhaler, Inhale 1 puff into the lungs every 6 (six) hours as needed for wheezing or shortness of breath., Disp: , Rfl:    atorvastatin (LIPITOR) 80 MG tablet, Take 1 tablet (80 mg total) by mouth at bedtime., Disp: 90 tablet, Rfl: 0   busPIRone (BUSPAR) 10 MG tablet, Take 1 tablet by mouth in the morning and at bedtime., Disp: , Rfl:    clopidogrel (PLAVIX) 75 MG tablet, TAKE ONE TABLET BY MOUTH EVERY MORNING, Disp: 90 tablet, Rfl: 2   Diclofenac Sodium 3 % GEL, Apply topically., Disp: , Rfl:    DULoxetine (CYMBALTA) 30 MG capsule, Take 60 mg by mouth daily., Disp: , Rfl:    ENTRESTO 49-51 MG, TAKE ONE TABLET BY MOUTH TWICE DAILY, Disp: 60 tablet, Rfl: 5    Evolocumab (REPATHA SURECLICK) 786 MG/ML SOAJ, Inject 140 mg into the skin every 14 (fourteen) days for 6 doses., Disp: 2 mL, Rfl: 3   ferrous sulfate 325 (65 FE) MG EC tablet, Take 1 tablet by mouth daily at 12 noon., Disp: , Rfl:    furosemide (LASIX) 20 MG tablet, Take 1 tablet (20 mg total) by mouth daily as needed (shortness of breath, swelling, weight gain)., Disp: 30 tablet, Rfl: 0   gabapentin (NEURONTIN) 600 MG tablet, Take 1 capsule by mouth 3 (three) times daily. , Disp: , Rfl:    magnesium oxide (MAG-OX) 400 MG tablet, Take 1 tablet by mouth daily at 12 noon., Disp: , Rfl:    metoprolol succinate (TOPROL-XL) 25 MG 24 hr tablet, TAKE ONE TABLET BY MOUTH ONCE DAILY, Disp: 90 tablet, Rfl: 2   naloxone (NARCAN) nasal spray 4 mg/0.1 mL, SMARTSIG:1 Spray(s) Both Nares 1 to 2 Times Daily, Disp: , Rfl:    omeprazole (PRILOSEC) 40 MG capsule, Take 1 capsule by mouth daily., Disp: , Rfl:    oxyCODONE-acetaminophen (PERCOCET) 10-325 MG tablet, Take by mouth. Take 1 tablet by mouth in the morning and 1 tablet at noon and 1 tablet in the evening and 1 tablet before bedtime, Disp: , Rfl:    prazosin (MINIPRESS) 1 MG capsule, Take 1  mg by mouth at bedtime., Disp: , Rfl:    QUEtiapine (SEROQUEL) 300 MG tablet, Take by mouth at bedtime. 1 and 1/2 tablet daily, Disp: , Rfl:    Semaglutide,0.25 or 0.5MG/DOS, (OZEMPIC, 0.25 OR 0.5 MG/DOSE,) 2 MG/1.5ML SOPN, Inject 0.5 mg into the skin once a week., Disp: , Rfl:    VASCEPA 1 g capsule, Take 2 capsules (2 g total) by mouth 2 (two) times daily., Disp: 120 capsule, Rfl: 2   warfarin (COUMADIN) 3 MG tablet, TAKE TWO TABLETS BY MOUTH each DAY AT 4pm OR AS DIRECTED by coumadin clinic, Disp: 480 tablet, Rfl: 0  Orders Placed This Encounter  Procedures   POCT INR    There are no Patient Instructions on file for this visit.   --Continue cardiac medications as reconciled in final medication list. --No follow-ups on file. Or sooner if needed. --Continue  follow-up with your primary care physician regarding the management of your other chronic comorbid conditions.  Patient's questions and concerns were addressed to her satisfaction. She voices understanding of the instructions provided during this encounter.   This note was created using a voice recognition software as a result there may be grammatical errors inadvertently enclosed that do not reflect the nature of this encounter. Every attempt is made to correct such errors.   Rex Kras, [place], [facility]  Pager: [phone] Office: [phone]

## 2021-12-15 NOTE — Progress Notes (Signed)
ICD-10-CM   1. Hyperlipidemia, unspecified hyperlipidemia type  E78.5 Evolocumab SOAJ 140 mg      Administrations This Visit     Evolocumab SOAJ 140 mg     Admin Date 11/07/2021 Action Given Dose 140 mg Route Subcutaneous Administered By Theotis Burrow

## 2021-12-15 NOTE — Progress Notes (Signed)
Description   INR today was 3.0. Patient does 4.5 mg Sun, Tues, and Fri, and 3 mg all the other days. Recheck INR in 2 weeks 11/21/2021. No change.   Lab Results      Component                Value               Date                      I           INR                      3.9 (A)             11/21/2021                INR                      3.0                 11/07/2021             (Z95.2) S/P mitral valve replacement  (primary encounter diagnosis)  (I48.0) Paroxysmal atrial fibrillation ([place]) Plan: POCT INR  (Z51.81,  Z79.01) Monitoring for long-term anticoagulant use Plan: POCT INR

## 2021-12-18 ENCOUNTER — Ambulatory Visit: Payer: Medicaid Other | Admitting: Cardiology

## 2021-12-18 DIAGNOSIS — Z952 Presence of prosthetic heart valve: Secondary | ICD-10-CM

## 2021-12-18 DIAGNOSIS — E7800 Pure hypercholesterolemia, unspecified: Secondary | ICD-10-CM

## 2021-12-18 DIAGNOSIS — Z5181 Encounter for therapeutic drug level monitoring: Secondary | ICD-10-CM

## 2021-12-18 DIAGNOSIS — I480 Paroxysmal atrial fibrillation: Secondary | ICD-10-CM

## 2021-12-18 LAB — POCT INR: INR: 3.1 — AB (ref 2.0–3.0)

## 2021-12-18 MED ORDER — EVOLOCUMAB 140 MG/ML ~~LOC~~ SOAJ
140.0000 mg | Freq: Once | SUBCUTANEOUS | Status: AC
  Administered 2021-12-18: 140 mg via SUBCUTANEOUS

## 2021-12-18 NOTE — Progress Notes (Signed)
Anti-Coagulation Progress Note  Joanna Martinez is a 61 y.o. female who is currently on an anti-coagulation regimen for atrial fibrillation and mechanical mitral valve   ANTI-COAG DOSE: Description   12/18/2021    INR today was 3.1. Patient goal level is (2.5-3.5)  Prior dose was 4.5 mg Sunday, Tuesday, and Friday, and 3 mg all the other days.  patient will continue 4.5 mg Sunday, Tuesday, and Friday, and 3 mg all the other days   Pt will recheck INR 4 weeks on 11/16.   Lab Results      Component                Value               Date                      INR                      3.1 (A)             12/18/2021                INR                      2.9                 12/07/2021                INR                      3.9 (A)             09 /19/2023            S/P mitral valve replacement  (primary encounter diagnosis) Plan: POCT INR  Paroxysmal atrial fibrillation ([place])  Monitoring for long-term anticoagulant use      ASSESSMENT:   ICD-10-CM   1. S/P mitral valve replacement  Z95.2 POCT INR    2. Paroxysmal atrial fibrillation (HCC)  I48.0     3. Monitoring for long-term anticoagulant use  Z51.81    Z79.01     4. Pure hypercholesterolemia  E78.00 Evolocumab SOAJ 140 mg      Administrations This Visit     Evolocumab SOAJ 140 mg     Admin Date 12/18/2021 Action Given Dose 140 mg Route Subcutaneous Administered By Damita Lack

## 2022-01-01 ENCOUNTER — Other Ambulatory Visit: Payer: Self-pay | Admitting: Cardiology

## 2022-01-01 DIAGNOSIS — I2510 Atherosclerotic heart disease of native coronary artery without angina pectoris: Secondary | ICD-10-CM

## 2022-01-01 DIAGNOSIS — I5022 Chronic systolic (congestive) heart failure: Secondary | ICD-10-CM

## 2022-01-01 DIAGNOSIS — I255 Ischemic cardiomyopathy: Secondary | ICD-10-CM

## 2022-01-01 DIAGNOSIS — Z955 Presence of coronary angioplasty implant and graft: Secondary | ICD-10-CM

## 2022-01-15 ENCOUNTER — Other Ambulatory Visit: Payer: Self-pay | Admitting: Cardiology

## 2022-01-15 DIAGNOSIS — I2510 Atherosclerotic heart disease of native coronary artery without angina pectoris: Secondary | ICD-10-CM

## 2022-01-15 DIAGNOSIS — I255 Ischemic cardiomyopathy: Secondary | ICD-10-CM

## 2022-01-15 DIAGNOSIS — Z955 Presence of coronary angioplasty implant and graft: Secondary | ICD-10-CM

## 2022-01-19 ENCOUNTER — Other Ambulatory Visit: Payer: Self-pay

## 2022-01-19 DIAGNOSIS — I5022 Chronic systolic (congestive) heart failure: Secondary | ICD-10-CM

## 2022-01-19 MED ORDER — ENTRESTO 49-51 MG PO TABS: 1.0000 | ORAL_TABLET | Freq: Two times a day (BID) | ORAL | 5 refills | Status: DC

## 2022-01-23 ENCOUNTER — Ambulatory Visit: Payer: Medicaid Other | Admitting: Cardiology

## 2022-01-23 DIAGNOSIS — I480 Paroxysmal atrial fibrillation: Secondary | ICD-10-CM

## 2022-01-23 DIAGNOSIS — Z5181 Encounter for therapeutic drug level monitoring: Secondary | ICD-10-CM

## 2022-01-23 DIAGNOSIS — Z952 Presence of prosthetic heart valve: Secondary | ICD-10-CM

## 2022-01-23 LAB — POCT INR: POC INR: 3.5

## 2022-01-23 NOTE — Progress Notes (Signed)
Anti-Coagulation Progress Note  Joanna Martinez is a 61 y.o. female who is currently on an anti-coagulation regimen for atrial fibrillation and mechanical mitral valve   ANTI-COAG DOSE: Description   01/23/2022    INR today was 3.5. Patient goal level is (2.54-3.5)  Prior dose was 4.5 mg Sunday, Tuesday, and Friday, and 3 mg all the other days   Patient will continue 4.5 mg Sunday, Tuesday, and Friday, and 3 mg all the other days. Patient would eat some greens for Thanksgiving due to that patient dose will not change.   Pt will recheck INR 2 weeks on 12/05.   Lab Results      Component                Value               Date                      INR                      3.5                 01/23/2022                INR                      3.1 (A)             12/18/2021                INR                      2.9                 10 /07/2021            Paroxysmal atrial fibrillation (HCC)  Monitoring for long-term anticoagulant use      ASSESSMENT:   ICD-10-CM   1. S/P mitral valve replacement  Z95.2 POCT INR    2. Paroxysmal atrial fibrillation (HCC)  I48.0     3. Monitoring for long-term anticoagulant use  Z51.81    Z79.01

## 2022-02-16 ENCOUNTER — Ambulatory Visit: Payer: Medicaid Other | Admitting: Cardiology

## 2022-02-16 DIAGNOSIS — I480 Paroxysmal atrial fibrillation: Secondary | ICD-10-CM

## 2022-02-16 DIAGNOSIS — Z952 Presence of prosthetic heart valve: Secondary | ICD-10-CM

## 2022-02-16 DIAGNOSIS — Z5181 Encounter for therapeutic drug level monitoring: Secondary | ICD-10-CM

## 2022-02-16 LAB — POCT INR: POC INR: 3.7

## 2022-02-16 NOTE — Progress Notes (Signed)
Description   02/16/2022    INR today was 3.7. Patient goal level is (2.5-3.5)  Prior dose was 4.5 mg Sunday, Tuesday, Friday and 3 mg all the other days.  patient will now start 4.5 mg Tuesday and Friday and 3 mg all other days.  Pt will recheck INR 3 weeks on 03/09/2022.  Lab Results      Component                Value               Date                      INR                      3.7                 02/16/2022                INR                      3.5                 01/23/2022                INR                      3.1 (A)             10 /16/2023            S/P mitral valve replacement  (primary encounter diagnosis) Plan: POCT INR  Paroxysmal atrial fibrillation (HCC)  Monitoring for long-term anticoagulant use

## 2022-03-09 ENCOUNTER — Ambulatory Visit: Payer: Medicaid Other | Admitting: Cardiology

## 2022-03-09 VITALS — Wt 198.0 lb

## 2022-03-09 DIAGNOSIS — Z7901 Long term (current) use of anticoagulants: Secondary | ICD-10-CM

## 2022-03-09 DIAGNOSIS — I480 Paroxysmal atrial fibrillation: Secondary | ICD-10-CM

## 2022-03-09 DIAGNOSIS — Z952 Presence of prosthetic heart valve: Secondary | ICD-10-CM

## 2022-03-09 LAB — POCT INR: POC INR: 2

## 2022-03-09 MED ORDER — ENOXAPARIN SODIUM 100 MG/ML IJ SOSY: 90.0000 mg | PREFILLED_SYRINGE | Freq: Two times a day (BID) | INTRAMUSCULAR | 0 refills | Status: DC

## 2022-03-09 NOTE — Progress Notes (Signed)
Description   03/09/2022    INR today was 2.0. Patient goal level is (2.5-3.5)  Prior dose was 4.5 mg Tuesday, Friday and 3 mg all the other days.  patient will now start back on 4.5 mg Monday, Wednesday, Friday and 3 mg all the other days. Along with 90 mg Lovenox BID.  Pt will recheck INR 4 days on 03/13/2022.   Lab Results      Component                Value               Date                      INR                      2.0                 03/09/2022                INR                      3.7                 02/16/2022                INR                      3.5                 01/23/2022            S/P mitral valve replacement  (primary encounter diagnosis) Plan: POCT INR, enoxaparin (LOVENOX) 100 MG/ML        injection  Paroxysmal atrial fibrillation (HCC) Plan: POCT INR, enoxaparin (LOVENOX) 100 MG/ML        injection  Monitoring for long-term anticoagulant use           Nigel Mormon, MD Pager: [phone] Office: [phone]

## 2022-03-13 ENCOUNTER — Ambulatory Visit: Payer: Medicaid Other | Admitting: Cardiology

## 2022-03-13 DIAGNOSIS — Z7901 Long term (current) use of anticoagulants: Secondary | ICD-10-CM

## 2022-03-13 DIAGNOSIS — I480 Paroxysmal atrial fibrillation: Secondary | ICD-10-CM

## 2022-03-13 DIAGNOSIS — Z952 Presence of prosthetic heart valve: Secondary | ICD-10-CM

## 2022-03-13 LAB — POCT INR: INR: 2.5 (ref 2.0–3.0)

## 2022-03-14 NOTE — Progress Notes (Signed)
   Anti-Coagulation Progress Note Lab Results  Component Value Date   INR 2.5 03/13/2022   INR 2.0 03/09/2022   INR 3.7 02/16/2022    ANTI-COAG DOSE: Description   03/13/2022    INR today was 2.5. Patient goal level is (2.5-3.5)  Prior dose was 4.5 mg Monday, Wednesday, Friday and 3 mg all the other days. Along with 90 mg Lovenox BID.  Patient will continue 4.5 mg Monday, Wednesday, Friday and 3 mg all the other days.and no longer taking Lovenox 90 mg  Pt will recheck INR 4 weeks on 02/09.   Lab Results      Component                Value               Date                      INR                      2.0                 03/09/2022                INR                      3.7                 02/16/2022                INR                      3.5                 01/23/2022             Paroxysmal atrial fibrillation (HCC) Plan: POCT INR, enoxaparin (LOVENOX) 100 MG/ML        injection  Monitoring for long-term anticoagulant use           ASSESSMENT:   ICD-10-CM   1. S/P mitral valve replacement  Z95.2 POCT INR    2. Paroxysmal atrial fibrillation (HCC)  I48.0     3. Monitoring for long-term anticoagulant use  Z51.81    Z79.01      PLAN: INR is 2.5 today. Hold Lovenox for now.  Continue coumadin 4.5 mg Monday, Wednesday, Friday and 3 mg all the other days. Follow up in 1 month.   Rex Kras, [place], [facility]  Pager: [phone] Office: [phone]

## 2022-04-09 ENCOUNTER — Other Ambulatory Visit: Payer: Self-pay | Admitting: Cardiology

## 2022-04-09 DIAGNOSIS — Z955 Presence of coronary angioplasty implant and graft: Secondary | ICD-10-CM

## 2022-04-09 DIAGNOSIS — I5022 Chronic systolic (congestive) heart failure: Secondary | ICD-10-CM

## 2022-04-09 DIAGNOSIS — I255 Ischemic cardiomyopathy: Secondary | ICD-10-CM

## 2022-04-09 DIAGNOSIS — I2510 Atherosclerotic heart disease of native coronary artery without angina pectoris: Secondary | ICD-10-CM

## 2022-04-13 ENCOUNTER — Ambulatory Visit: Payer: Medicaid Other | Admitting: Cardiology

## 2022-04-13 DIAGNOSIS — I480 Paroxysmal atrial fibrillation: Secondary | ICD-10-CM

## 2022-04-13 DIAGNOSIS — Z952 Presence of prosthetic heart valve: Secondary | ICD-10-CM

## 2022-04-13 DIAGNOSIS — Z5181 Encounter for therapeutic drug level monitoring: Secondary | ICD-10-CM

## 2022-04-13 LAB — POCT INR: POC INR: 3.7

## 2022-04-13 NOTE — Progress Notes (Signed)
Patient's Rep[employer] prior authorization has been approved through 07/11/2022 ([employer])

## 2022-04-16 NOTE — Progress Notes (Signed)
Anti-Coagulation Progress Note  Joanna Martinez is a 62 y.o. female who is currently on an anti-coagulation regimen for atrial fibrillation and mechanical mitral valve.  ANTI-COAG DOSE: Description   04/13/2022  INR today was 3.7. Patient goal level is (2.5-3.5)  Prior dose was 4.5 mg Monday, Wednesday, Friday and 3 mg all the other days.  patient will now start on 4.5 mg Monday, Friday, and 3 mg all the other days.  Pt will recheck INR 2 weeks on 04/27/22.  Lab Results      Component                Value               Date                      INR                      3.7                 04/13/2022                INR                      2.5                 03/13/2022                INR                      2.0                 03/09/2022            S/P mitral valve replacement  (primary encounter diagnosis) Plan: POCT INR  Paroxysmal atrial fibrillation (HCC)  Monitoring for long-term anticoagulant use         ASSESSMENT: S/P mitral valve replacement  Paroxysmal atrial fibrillation (HCC)   Monitoring for long-term anticoagulant use  PLAN: Reduce the dose of COVID as discussed above. Recheck in 2 weeks   Rex Kras, [place], [facility]  Pager: [phone] Office: [phone]

## 2022-04-30 ENCOUNTER — Ambulatory Visit: Payer: Medicaid Other | Admitting: Cardiology

## 2022-04-30 ENCOUNTER — Other Ambulatory Visit: Payer: Self-pay

## 2022-04-30 DIAGNOSIS — Z952 Presence of prosthetic heart valve: Secondary | ICD-10-CM

## 2022-04-30 DIAGNOSIS — Z5181 Encounter for therapeutic drug level monitoring: Secondary | ICD-10-CM

## 2022-04-30 DIAGNOSIS — I480 Paroxysmal atrial fibrillation: Secondary | ICD-10-CM

## 2022-04-30 LAB — POCT INR: INR: 2.1 (ref 2.0–3.0)

## 2022-04-30 MED ORDER — ENOXAPARIN SODIUM 100 MG/ML IJ SOSY: 90.0000 mg | PREFILLED_SYRINGE | Freq: Two times a day (BID) | INTRAMUSCULAR | 0 refills | Status: DC

## 2022-04-30 MED ORDER — WARFARIN SODIUM 4 MG PO TABS: 4.0000 mg | ORAL_TABLET | ORAL | 1 refills | Status: DC

## 2022-04-30 NOTE — Telephone Encounter (Signed)
Patient preference to p/u Rx at [facility]

## 2022-04-30 NOTE — Progress Notes (Signed)
Anti-Coagulation Progress Note  Joanna Martinez is a 62 y.o. female who is currently on an anti-coagulation regimen for atrial fibrillation and mechanical mitral valve.  ANTI-COAG DOSE: Description   04/30/2022    INR today was 2.1. Patient goal level is (2.5-3.5)  Prior dose was 4.5 mg Monday, Friday, and 3 mg all the other days.   Patient will now start on 4 mg Monday, Wednesday, Friday, and 3 mg all the other days with Lovenox 90 mg BID for 5 days  Pt will recheck INR 1 weeks on 03/04.    Lab Results      Component                Value               Date                      INR                      3.7                 04/13/2022                INR                      2.5                 03/13/2022                INR                      2.0                 03/09/2022                    S/P mitral valve replacement  (primary encounter diagnosis) Plan: POCT INR  Paroxysmal atrial fibrillation ([place])     Orders Placed This Encounter  Procedures   POCT INR    Meds ordered this encounter  Medications   DISCONTD: enoxaparin (LOVENOX) 100 MG/ML injection    Sig: Inject 0.9 mLs (90 mg total) into the skin every 12 (twelve) hours for 5 days.    Dispense:  9 mL    Refill:  0   DISCONTD: warfarin (COUMADIN) 4 MG tablet    Sig: Take 1 tablet (4 mg total) by mouth as directed for 180 doses. Daily in the evening per INR clinic    Dispense:  90 tablet    Refill:  1

## 2022-05-02 ENCOUNTER — Encounter: Payer: Self-pay | Admitting: Cardiology

## 2022-05-02 NOTE — Progress Notes (Signed)
Patient is currently on Warfarin for H/O mitral valve replacement with goal INR 2.5-3.5. Patient was diagnosed with a UTI and prescribed ciprofloxacin '250mg'$  BID [id number] by NP Zeb Comfort at [facility]. Patient has a history of reoccurring UTIs and has a urology consult scheduled for May 17, 2022.  Had the nurse practitioner send over the susceptibility report and labs. Bacterial UTI caused by Protues mirabilis is resistant to Macrobid and only susceptible to antibiotics that interact with warfarin.   Discussed with Dr. Einar Gip - since patient's recent INR on Monday 04/30/22 was subtherapeutic (2.1) and Enoxaparin was started with increase in warfarin dose - patient to start the ciprofloxacin on Monday 05/07/22 after getting her INR rechecked in order to not over correct patient's INR at this time. Spoke with patient and she knows not to take the ciprofloxacin until Monday. Informed NP Zeb Comfort at [place] of plan. Patient is to let us know what urology's plan is to treat patient's reoccurring UTIs (may need to bring patient in for more frequent INR checks if prescribed chronic interacting antibiotics).   Will adjust warfarin accordingly on Monday based on resulting INR and anticipation that INR will increase with ciprofloxacin next week.    Haynes Dage, RPh

## 2022-05-07 ENCOUNTER — Other Ambulatory Visit: Payer: Self-pay | Admitting: Cardiology

## 2022-05-07 ENCOUNTER — Ambulatory Visit: Payer: Medicaid Other | Admitting: Cardiology

## 2022-05-07 DIAGNOSIS — I480 Paroxysmal atrial fibrillation: Secondary | ICD-10-CM

## 2022-05-07 DIAGNOSIS — Z952 Presence of prosthetic heart valve: Secondary | ICD-10-CM

## 2022-05-07 DIAGNOSIS — Z5181 Encounter for therapeutic drug level monitoring: Secondary | ICD-10-CM

## 2022-05-07 LAB — POCT INR: INR: 1.6 — AB (ref 2.0–3.0)

## 2022-05-07 MED ORDER — ENOXAPARIN SODIUM 100 MG/ML IJ SOSY: 90.0000 mg | PREFILLED_SYRINGE | Freq: Two times a day (BID) | INTRAMUSCULAR | 0 refills | Status: DC

## 2022-05-07 MED ORDER — WARFARIN SODIUM 5 MG PO TABS: 5.0000 mg | ORAL_TABLET | Freq: Every day | ORAL | 0 refills | Status: DC

## 2022-05-07 NOTE — Progress Notes (Signed)
Anti-Coagulation Progress Note  Joanna Martinez is a 62 y.o. female who is currently on an anti-coagulation regimen for atrial fibrillation and mechanical mitral valve.  ANTI-COAG DOSE: Description   05/07/2022    INR today was 1.6. Patient goal level is (2.5-3.5)  Prior dose was 4 mg  Monday, Wednesdays, Friday and 3 mg all the other days.  Patient will now start on 5 mg Monday, Wednesday, Friday, and 3 mg all the other days with Lovenox 90 mg BID for 5 days.  Pt will recheck INR 1 weeks on 03/11.   Lab Results      Component                Value               Date                      INR                      1.6 (A)             05/07/2022                INR                      2.1                 04/30/2022                INR                      3.7                 04/13/2022             Lab Results      Component                Value               Date                      INR                      3.7                 04/13/2022                INR                      2.5                 03/13/2022                INR                      2.0                 03/09/2022                    S/P mitral valve replacement  (primary encounter diagnosis) Plan: POCT INR  Paroxysmal atrial fibrillation ([place])     Orders Placed This Encounter  Procedures   POCT INR    Meds ordered this encounter  Medications   DISCONTD: warfarin (COUMADIN) 5 MG tablet    Sig: Take 1 tablet (  5 mg total) by mouth daily. 5 mg on Monday, Wednesday, Friday    Dispense:  90 tablet    Refill:  0   enoxaparin (LOVENOX) 100 MG/ML injection    Sig: Inject 0.9 mLs (90 mg total) into the skin every 12 (twelve) hours for 5 days.    Dispense:  9 mL    Refill:  0

## 2022-05-14 ENCOUNTER — Encounter (HOSPITAL_COMMUNITY): Payer: Self-pay | Admitting: Emergency Medicine

## 2022-05-14 ENCOUNTER — Other Ambulatory Visit: Payer: Self-pay

## 2022-05-14 ENCOUNTER — Ambulatory Visit: Payer: Medicaid Other | Admitting: Cardiology

## 2022-05-14 ENCOUNTER — Emergency Department (HOSPITAL_COMMUNITY)
Admission: EM | Admit: 2022-05-14 | Discharge: 2022-05-14 | Disposition: A | Discharge status: Home / Self Care | Payer: Medicaid Other | Attending: Emergency Medicine | Admitting: Emergency Medicine

## 2022-05-14 DIAGNOSIS — I509 Heart failure, unspecified: Secondary | ICD-10-CM | POA: Diagnosis not present

## 2022-05-14 DIAGNOSIS — N189 Chronic kidney disease, unspecified: Secondary | ICD-10-CM | POA: Insufficient documentation

## 2022-05-14 DIAGNOSIS — Z79899 Other long term (current) drug therapy: Secondary | ICD-10-CM | POA: Insufficient documentation

## 2022-05-14 DIAGNOSIS — J449 Chronic obstructive pulmonary disease, unspecified: Secondary | ICD-10-CM | POA: Insufficient documentation

## 2022-05-14 DIAGNOSIS — I130 Hypertensive heart and chronic kidney disease with heart failure and stage 1 through stage 4 chronic kidney disease, or unspecified chronic kidney disease: Secondary | ICD-10-CM | POA: Insufficient documentation

## 2022-05-14 DIAGNOSIS — Z952 Presence of prosthetic heart valve: Secondary | ICD-10-CM

## 2022-05-14 DIAGNOSIS — Z7902 Long term (current) use of antithrombotics/antiplatelets: Secondary | ICD-10-CM | POA: Diagnosis not present

## 2022-05-14 DIAGNOSIS — Z5181 Encounter for therapeutic drug level monitoring: Secondary | ICD-10-CM

## 2022-05-14 DIAGNOSIS — Z955 Presence of coronary angioplasty implant and graft: Secondary | ICD-10-CM | POA: Insufficient documentation

## 2022-05-14 DIAGNOSIS — I480 Paroxysmal atrial fibrillation: Secondary | ICD-10-CM

## 2022-05-14 DIAGNOSIS — I2510 Atherosclerotic heart disease of native coronary artery without angina pectoris: Secondary | ICD-10-CM | POA: Insufficient documentation

## 2022-05-14 DIAGNOSIS — Z8673 Personal history of transient ischemic attack (TIA), and cerebral infarction without residual deficits: Secondary | ICD-10-CM | POA: Insufficient documentation

## 2022-05-14 DIAGNOSIS — R300 Dysuria: Secondary | ICD-10-CM

## 2022-05-14 DIAGNOSIS — R791 Abnormal coagulation profile: Secondary | ICD-10-CM | POA: Insufficient documentation

## 2022-05-14 DIAGNOSIS — Z7901 Long term (current) use of anticoagulants: Secondary | ICD-10-CM | POA: Diagnosis not present

## 2022-05-14 LAB — CBC
HCT: 36.4 % (ref 36.0–46.0)
Hemoglobin: 11.8 g/dL — ABNORMAL LOW (ref 12.0–15.0)
MCH: 31.1 pg (ref 26.0–34.0)
MCHC: 32.4 g/dL (ref 30.0–36.0)
MCV: 96 fL (ref 80.0–100.0)
Platelets: 360 10*3/uL (ref 150–400)
RBC: 3.79 MIL/uL — ABNORMAL LOW (ref 3.87–5.11)
RDW: 15.9 % — ABNORMAL HIGH (ref 11.5–15.5)
WBC: 6.4 10*3/uL (ref 4.0–10.5)
nRBC: 0 % (ref 0.0–0.2)

## 2022-05-14 LAB — BASIC METABOLIC PANEL
Anion gap: 7 (ref 5–15)
BUN: 14 mg/dL (ref 8–23)
CO2: 25 mmol/L (ref 22–32)
Calcium: 8.5 mg/dL — ABNORMAL LOW (ref 8.9–10.3)
Chloride: 108 mmol/L (ref 98–111)
Creatinine, Ser: 1.26 mg/dL — ABNORMAL HIGH (ref 0.44–1.00)
GFR, Estimated: 48 mL/min — ABNORMAL LOW (ref >60)
Glucose, Bld: 109 mg/dL — ABNORMAL HIGH (ref 70–99)
Potassium: 4.2 mmol/L (ref 3.5–5.1)
Sodium: 140 mmol/L (ref 135–145)

## 2022-05-14 LAB — URINALYSIS, ROUTINE W REFLEX MICROSCOPIC
Bilirubin Urine: NEGATIVE
Glucose, UA: NEGATIVE mg/dL
Hgb urine dipstick: NEGATIVE
Ketones, ur: NEGATIVE mg/dL
Leukocytes,Ua: NEGATIVE
Nitrite: NEGATIVE
Protein, ur: NEGATIVE mg/dL
Specific Gravity, Urine: >1.03 — ABNORMAL HIGH (ref 1.005–1.030)
pH: 6 (ref 5.0–8.0)

## 2022-05-14 LAB — PROTIME-INR
INR: 1.3 — ABNORMAL HIGH (ref 0.8–1.2)
Prothrombin Time: 16.2 seconds — ABNORMAL HIGH (ref 11.4–15.2)

## 2022-05-14 LAB — POCT INR: INR: 1.4 — AB (ref 2.0–3.0)

## 2022-05-14 NOTE — Progress Notes (Signed)
Anti-Coagulation Progress Note  Joanna Martinez is a 62 y.o. female who is currently on an anti-coagulation regimen for atrial fibrillation and mechanical mitral valve.  ANTI-COAG DOSE: Description   05/14/2022    INR today was 1.4. Patient goal level is (2.5-3.5)  Current dose was 5 mg Monday, Wednesday and Friday and 3 mg all the other days - total being '27mg'$   New dose  '5mg'$  daily (total dose '35mg'$ ) with Lovenox '90mg'$  BID for 5 days.  Pt will recheck INR 1 weeks on 03/18.   Lab Results      Component                Value               Date                      INR                      1.4 (A)             05/14/2022                INR                      1.6 (A)             05/07/2022                INR                      2.1                 04/30/2022               Orders Placed This Encounter  Procedures   POCT INR    No orders of the defined types were placed in this encounter.    ICD-10-CM   1. H/O mitral valve replacement with mechanical valve  Z95.2 POCT INR    2. Paroxysmal atrial fibrillation (HCC)  I48.0     3. Monitoring for long-term anticoagulant use  Z51.81    Z79.01     4. S/P mitral valve replacement  Z95.2      RECOMMENDATIONS: Patient states that she is taking her Lovenox regularly along with the current dose of Coumadin.  She has been indiscretion to clinically festivals which may contribute to potassium hemostasis.  Re educated her on dietary restrictions.  Patient states that she does not want to be on Lovenox due to bruising.  She rather go to the hospital for IV heparin.  Patient is advised to go to ER for admission and bridging to Coumadin.  If she is sent home from ED it is imperative that she continue her Lovenox to prevent valve thrombosis as her INR is subtherapeutic.  Lovenox 90 mg IV twice daily Continue Coumadin 5 mg every day Follow-up INR check in 1 week  [name] [facility]  Pager:  [phone] Office: [phone]

## 2022-05-14 NOTE — ED Triage Notes (Signed)
Pt reports she went to see her primary doctor Feb 13th. Dx with urinary infection, started taking cipro. Pt reports she has a mechanical valve and is also on a blood thinner, lovenox. Today her INR was low. Pt states her cardiology doctor told her not to take her cipro anymore and to come to the hospital. Pt reports still having sx of UTI including hematuria and odor when wiping. VSS NAD at present.

## 2022-05-14 NOTE — ED Provider Triage Note (Signed)
Emergency Medicine Provider Triage Evaluation Note  Joanna Martinez , a 62 y.o. female  was evaluated in triage.  Pt complains of dysuria, frequency.  Told she had a UTI on February 13 however she is on Coumadin for mechanical valve and has not taken any antibiotics because her care team cannot decide what antibiotic would be best with her Coumadin.  Reports vomiting at times, denies abdominal pain or fever.  States that they have been adjusting her Coumadin dose however her INR is subtherapeutic.  She is currently doing Lovenox injections  Review of Systems  Positive:  Negative:   Physical Exam  BP (!) 103/58   Pulse 78   Temp 98.4 F (36.9 C)   Resp 16   Ht 5' (1.524 m)   Wt 89.8 kg   SpO2 97%   BMI 38.67 kg/m  Gen:   Awake, no distress   Resp:  Normal effort  MSK:   Moves extremities without difficulty  Other:    Medical Decision Making  Medically screening exam initiated at 2:12 PM.  Appropriate orders placed.  [name] was informed that the remainder of the evaluation will be completed by another provider, this initial triage assessment does not replace that evaluation, and the importance of remaining in the ED until their evaluation is complete.     Tacy Learn, PA-C 05/14/22 1413

## 2022-05-14 NOTE — ED Provider Notes (Signed)
[facility] Provider Note   CSN: [id number] Arrival date & time: 05/14/22  1315     History  Chief Complaint  Patient presents with   Dysuria    Joanna Martinez is a 62 y.o. female with a past medical history of asthma, CHF, CKD, COPD, CAD status post mitral valve replacement, hyperlipidemia, hypertension presents today for evaluation of abnormal labs and dysuria.  Patient reports she was diagnosed with urinary tract infection back in February 13, was prescribed ciprofloxacin.  Patient was then seen by her cardiologist who told her to discontinue her ciprofloxacin.  Patient states she still has burning with urination and minimal blood when wiping.  Patient reports her cardiologist has recently increased her warfarin from 3 to 5 mg a day to 5 mg a day due to low INR.  She is also taking Lovenox 90 mg twice daily a day however her INR is 2 around 1.5.  Patient reports her cardiologist told her to come to the ER for further evaluation.  She denies any fever, chest pain, shortness of breath, bowel changes, nausea or vomiting.   Dysuria   Past Medical History:  Diagnosis Date   Asthma    Cardiomyopathy ([place])    CHF (congestive heart failure) (HCC)    Chronic kidney disease    COPD (chronic obstructive pulmonary disease) (HCC)    Coronary artery disease    H/O heart valve replacement with mechanical valve    Heart attack (HCC)    Hyperlipidemia    Hypertension    Paroxysmal atrial fibrillation ([place])    Stroke [facility])    Past Surgical History:  Procedure Laterality Date   CARDIAC SURGERY     CORONARY ANGIOPLASTY WITH STENT PLACEMENT     MITRAL VALVE SURGERY       Home Medications Prior to Admission medications   Medication Sig Start Date End Date Taking? Authorizing Provider  Accu-Chek FastClix Lancets MISC Apply topically daily. 05/16/21   [provider]  ACCU-CHEK GUIDE test strip daily. as directed 05/16/21   [provider]  albuterol (VENTOLIN HFA) 108 (90 Base) MCG/ACT inhaler Inhale 1 puff into the lungs every 6 (six) hours as needed for wheezing or shortness of breath.    [provider]  atorvastatin (LIPITOR) 80 MG tablet TAKE ONE TABLET BY MOUTH EVERYDAY AT BEDTIME 04/09/22   Tolia, Sunit, DO  busPIRone (BUSPAR) 10 MG tablet Take 1 tablet by mouth in the morning and at bedtime. 08/17/20   [provider]  clopidogrel (PLAVIX) 75 MG tablet TAKE ONE TABLET BY MOUTH EVERY MORNING 10/30/21   Tolia, Sunit, DO  Diclofenac Sodium 3 % GEL Apply topically. 12/25/19   [provider]  DULoxetine (CYMBALTA) 30 MG capsule Take 60 mg by mouth daily. 12/01/20   [provider]  enoxaparin (LOVENOX) 100 MG/ML injection Inject 0.9 mLs (90 mg total) into the skin every 12 (twelve) hours for 5 days. 05/07/22 05/12/22  Tolia, Sunit, DO  ferrous sulfate 325 (65 FE) MG EC tablet Take 1 tablet by mouth daily at 12 noon. 05/14/19   [provider]  furosemide (LASIX) 20 MG tablet Take 1 tablet (20 mg total) by mouth daily as needed (shortness of breath, swelling, weight gain). 08/22/21 12/20/21  Cantwell, Celeste C, PA-C  gabapentin (NEURONTIN) 600 MG tablet Take 1 capsule by mouth 3 (three) times daily.  05/05/19   [provider]  magnesium oxide (MAG-OX) 400 MG tablet Take 1  tablet by mouth daily at 12 noon. 08/25/21   [provider]  metoprolol succinate (TOPROL-XL) 25 MG 24 hr tablet TAKE ONE TABLET BY MOUTH ONCE DAILY 01/02/22   Tolia, Sunit, DO  naloxone (NARCAN) nasal spray 4 mg/0.1 mL SMARTSIG:1 Spray(s) Both Nares 1 to 2 Times Daily 05/20/20   [provider]  omeprazole (PRILOSEC) 40 MG capsule Take 1 capsule by mouth daily. 04/20/20   [provider]  oxyCODONE-acetaminophen (PERCOCET) 10-325 MG tablet Take by mouth. Take 1 tablet by mouth in the morning and 1 tablet at noon and 1 tablet in the evening and 1 tablet before bedtime    [provider]  prazosin (MINIPRESS) 1 MG capsule Take 1 mg by mouth at bedtime. 09/06/21   [provider]  QUEtiapine (SEROQUEL) 300 MG tablet Take by mouth at bedtime. 1 and 1/2 tablet daily 03/23/20   [provider]  sacubitril-valsartan (ENTRESTO) 49-51 MG Take 1 tablet by mouth 2 (two) times daily. 01/19/22   Adrian Prows, MD  Semaglutide,0.25 or 0.'5MG'$ /DOS, (OZEMPIC, 0.25 OR 0.5 MG/DOSE,) 2 MG/1.5ML SOPN Inject 0.5 mg into the skin once a week.    [provider]  VASCEPA 1 g capsule TAKE TWO CAPSULES BY MOUTH AT [facility] AND AT BEDTIME 04/09/22   Tolia, Sunit, DO  warfarin (COUMADIN) 3 MG tablet TAKE TWO TABLETS BY MOUTH each DAY AT 4pm OR AS DIRECTED by coumadin clinic 07/11/21   Tolia, Sunit, DO  warfarin (COUMADIN) 5 MG tablet TAKE 1 TABLET BY MOUTH ONCE DAILY ON MONDAY, WEDNESDAY, AND FRIDAY 05/07/22   Tolia, Sunit, DO      Allergies    Nitrofurantoin, Hydrocodone-acetaminophen, Levofloxacin, and Sulfamethoxazole-trimethoprim    Review of Systems   Review of Systems  Genitourinary:  Positive for dysuria.    Physical Exam Updated Vital Signs BP (!) 98/55   Pulse 68   Temp 98.4 F (36.9 C)   Resp 16   Ht 5' (1.524 m)   Wt 89.8 kg   SpO2 100%   BMI 38.67 kg/m  Physical Exam Vitals and nursing note reviewed.  Constitutional:      Appearance: Normal appearance.  HENT:     Head: Normocephalic and atraumatic.     Mouth/Throat:     Mouth: Mucous membranes are moist.  Eyes:     General: No scleral icterus. Cardiovascular:     Rate and Rhythm: Normal rate and regular rhythm.     Pulses: Normal pulses.     Heart sounds: Normal heart sounds.  Pulmonary:     Effort: Pulmonary effort is normal.     Breath sounds: Normal breath sounds.  Abdominal:     General: Abdomen is flat.     Palpations: Abdomen is soft.     Tenderness: There is no abdominal tenderness.  Musculoskeletal:        General: No deformity.  Skin:    General: Skin is warm.      Findings: No rash.  Neurological:     General: No focal deficit present.     Mental Status: She is alert.  Psychiatric:        Mood and Affect: Mood normal.     ED Results / Procedures / Treatments   Labs (all labs ordered are listed, but only abnormal results are displayed) Labs Reviewed  URINALYSIS, ROUTINE W REFLEX MICROSCOPIC - Abnormal; Notable for the following components:      Result Value   Specific Gravity, Urine >1.030 (*)  All other components within normal limits  CBC - Abnormal; Notable for the following components:   RBC 3.79 (*)    Hemoglobin 11.8 (*)    RDW 15.9 (*)    All other components within normal limits  BASIC METABOLIC PANEL - Abnormal; Notable for the following components:   Glucose, Bld 109 (*)    Creatinine, Ser 1.26 (*)    Calcium 8.5 (*)    GFR, Estimated 48 (*)    All other components within normal limits  PROTIME-INR - Abnormal; Notable for the following components:   Prothrombin Time 16.2 (*)    INR 1.3 (*)    All other components within normal limits    EKG None  Radiology No results found.  Procedures Procedures    Medications Ordered in ED Medications - No data to display  ED Course/ Medical Decision Making/ A&P                             Medical Decision Making Amount and/or Complexity of Data Reviewed Labs: ordered.   This patient presents to the ED for low therapeutic INR, dysuria, vaginal bleeding this involves an extensive number of treatment options, and is a complaint that carries with a high risk of complications and morbidity.  The differential diagnosis includes medication noncompliance, STI, pyelonephritis, atrophic vaginitis, trichomonas, gonorrhea, chlamydia, infectious etiology.  This is not an exhaustive list.  Lab tests: I ordered and personally interpreted labs.  The pertinent results include: WBC unremarkable. Hbg unremarkable. Platelets unremarkable. Electrolytes unremarkable. BUN, creatinine 1.26.   UA unremarkable.  INR 1.3.  Imaging studies:  Problem list/ ED course/ Critical interventions/ Medical management: HPI: See above Vital signs within normal range and stable throughout visit. Laboratory/imaging studies significant for: See above. On physical examination, patient is afebrile and appears in no acute distress. This patient presents with multiple complaints including low therapeutic INR, vaginal bleeding, dysuria. No systemic symptoms. Not septic. Well appearing. Low suspicion for acute pyelonephritis given lack of fever, CVAT, or systemic features. Low suspicion for kidney [place] or infected Reser. Low suspicion for ovarian torsion, PID, or appendicitis. Based on patient's clinical presentations and laboratory/imaging studies I suspect vaginal bleeding secondary to atrophic vaginitis.  Patient has been seen by her cardiologist weekly, warfarin was increased to 5 mg daily in addition to Lovenox injection.  Patient will need to follow-up with her cardiologist for further evaluation and management of INR.  I do not think patient's symptoms are related to any emergent etiology.  I also advised patient to follow-up with her OB/GYN for further evaluation and of vaginal bleeding.  No evidence of UTI today with negative UA.  However patient has an appoint with urology tomorrow for further evaluation.  No antibiotics indicated at this point.  I have reviewed the patient home medicines and have made adjustments as needed.  Cardiac monitoring/EKG: The patient was maintained on a cardiac monitor.  I personally reviewed and interpreted the cardiac monitor which showed an underlying rhythm of: sinus rhythm.  Additional history obtained: External records from outside source obtained and reviewed including: Chart review including previous notes, labs, imaging.  Consultations obtained:  Disposition Continued outpatient therapy. Follow-up with PCP, urology, cardiology and OB/GYN recommended for  reevaluation of symptoms. Treatment plan discussed with patient.  Pt acknowledged understanding was agreeable to the plan. Worrisome signs and symptoms were discussed with patient, and patient acknowledged understanding to return to the ED if they noticed  these signs and symptoms. Patient was stable upon discharge.   This chart was dictated using voice recognition software.  Despite best efforts to proofread,  errors can occur which can change the documentation meaning.          Final Clinical Impression(s) / ED Diagnoses Final diagnoses:  Dysuria  Long-term (current) use of anticoagulants, INR goal 2.5-3.5    Rx / DC Orders ED Discharge Orders     None         Rex Kras, [place] 05/14/22 1819    Leanord Asal K, DO 05/15/22 0009

## 2022-05-14 NOTE — Discharge Instructions (Addendum)
I recommend close follow-up with urology, OB/GYN, cardiology and primary care physician for reevaluation.  Please do not hesitate to return to emergency department if worrisome signs symptoms we discussed become apparent.

## 2022-05-15 ENCOUNTER — Other Ambulatory Visit: Payer: Self-pay

## 2022-05-15 DIAGNOSIS — Z952 Presence of prosthetic heart valve: Secondary | ICD-10-CM

## 2022-05-15 MED ORDER — ENOXAPARIN SODIUM 100 MG/ML IJ SOSY: 90.0000 mg | PREFILLED_SYRINGE | Freq: Two times a day (BID) | INTRAMUSCULAR | 0 refills | Status: DC

## 2022-05-18 NOTE — Progress Notes (Signed)
Spoke with patient for 15-20 minutes regarding the concerns with her INR. Patient reported that her PCP has also inquired about the plan for her subtherapeutic INR. Patient had went to the ED on Monday but was unfortunately not admitted to be bridged with heparin; patient was discharged with instructions to follow back up with her heart doctor for INR management. Patient's UA in the ED revealed her UTI has resolved. Patient had a follow up urology follow up and they also confirmed patient does not have a UTI right now and prescribed her estradiol vaginal cream. Assured patient that the UTI would not have affected her INR and also that she can start using the local estradiol medication. Patient has been frustrated that her INR has been subtherapeutic the past three weeks and complains the Enoxaparin injection is making her bruise and leaving lumps under the skin. Had previously discussed with patient to rotate injection sites and other injections sites than the stomach. Confirmed again that patient has had no other changes in medications, supplements or what she has been eating. Confirmed with patient that her current anticoagulation regimen is Enoxaparin BID and warfarin 5mg  daily. Patient states she is very worried about her INR and feels like she is in trouble. Advised patient that as long as she is taking the Enoxaparin she should be covered for clot prevention regardless of INR. Patient states she is displeased with her care and is considering changing cardiologists; patient's PCP advised her not to. Advised patient that if she would like to consider going to a different cardiologist that it is completely up to her. Patient will see her primary cardiologist for an office visit after her scheduled INR draw on Monday 3/18 to discuss further.    Haynes Dage, RPh

## 2022-05-21 ENCOUNTER — Telehealth: Payer: Self-pay

## 2022-05-21 ENCOUNTER — Encounter: Payer: Self-pay | Admitting: Cardiology

## 2022-05-21 ENCOUNTER — Ambulatory Visit: Payer: Medicaid Other | Admitting: Cardiology

## 2022-05-21 VITALS — BP 94/55 | HR 85 | Resp 18 | Ht 60.0 in | Wt 199.0 lb

## 2022-05-21 DIAGNOSIS — Z952 Presence of prosthetic heart valve: Secondary | ICD-10-CM

## 2022-05-21 DIAGNOSIS — I480 Paroxysmal atrial fibrillation: Secondary | ICD-10-CM

## 2022-05-21 DIAGNOSIS — I5022 Chronic systolic (congestive) heart failure: Secondary | ICD-10-CM

## 2022-05-21 DIAGNOSIS — Z7901 Long term (current) use of anticoagulants: Secondary | ICD-10-CM

## 2022-05-21 DIAGNOSIS — Z8673 Personal history of transient ischemic attack (TIA), and cerebral infarction without residual deficits: Secondary | ICD-10-CM

## 2022-05-21 DIAGNOSIS — Z955 Presence of coronary angioplasty implant and graft: Secondary | ICD-10-CM

## 2022-05-21 DIAGNOSIS — Z792 Long term (current) use of antibiotics: Secondary | ICD-10-CM

## 2022-05-21 DIAGNOSIS — F17210 Nicotine dependence, cigarettes, uncomplicated: Secondary | ICD-10-CM

## 2022-05-21 DIAGNOSIS — I255 Ischemic cardiomyopathy: Secondary | ICD-10-CM

## 2022-05-21 DIAGNOSIS — I2510 Atherosclerotic heart disease of native coronary artery without angina pectoris: Secondary | ICD-10-CM

## 2022-05-21 DIAGNOSIS — I10 Essential (primary) hypertension: Secondary | ICD-10-CM

## 2022-05-21 DIAGNOSIS — E66812 Obesity, class 2: Secondary | ICD-10-CM

## 2022-05-21 DIAGNOSIS — E7800 Pure hypercholesterolemia, unspecified: Secondary | ICD-10-CM

## 2022-05-21 LAB — POCT INR: POC INR: 3.9

## 2022-05-21 MED ORDER — ENTRESTO 49-51 MG PO TABS: 1.0000 | ORAL_TABLET | Freq: Two times a day (BID) | ORAL | 5 refills | Status: DC

## 2022-05-21 NOTE — Telephone Encounter (Signed)
Patient weight has started to up trend over time.   Weight lb -- 197.3 (193.6 - 202.0)   05/21/22 6:03 AM 199.8 lb    05/20/22 5:47 AM 202.0 lb    05/19/22 6:16 AM 201.8 lb    05/18/22 8:30 AM 200.0 lb       05/16/22 6:46 AM 199.2 lb    05/15/22 6:39 AM 196.0 lb    05/14/22 6:43 AM 196.0 lb    05/12/22 6:51 AM 196.2 Lb

## 2022-05-21 NOTE — Progress Notes (Signed)
[name] Date of Birth: 02/02/1961 MRN: [id number] Primary Care Provider:S[place], Alfonso Ramus, [place] Former Cardiology Providers: Dr. Thayer Jew [phone] Primary Cardiologist: Rex Kras, DO, [facility] (established care 07/02/2019)  Date: 05/21/22 Last Office Visit: 12/07/2021  Chief Complaint  Patient presents with   Follow-up   Congestive Heart Failure    HPI  Joanna Martinez is a 62 y.o. 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   Patient presents today for follow-up for management of underlying CAD with prior PCI to the LCx, mechanical mitral valve replacement in March 2002 at [facility], and paroxysmal atrial fibrillation.  She has had multiple office visits in the recent past due to subtherapeutic INRs.  Her INR today is 3.9 on current Coumadin dosing.  Since last office visit, patient denies anginal discomfort or heart failure symptoms.  However she has been experiencing lightheaded and dizziness at times predominantly with changing in positions.  She denies any new episodes of bleeding.  She still has some vaginal spotting for which she plans to see GYN later this month.  She had gone to the ED in the recent past her INRs were subtherapeutic and they also agreed with continuing Coumadin and Lovenox given mechanical valve.  Her UA analysis at that time was negative for urinary tract infection.  Patient states that after the ED appointment she did follow-up with PCP and was  told that she has a urinary tract infection but has not been taking her antibiotics given the subtherapeutic INR.  She uses Lasix on as needed basis.  And has been taking Entresto regularly.  FUNCTIONAL STATUS: No exercise or daily routine.    ALLERGIES: Allergies  Allergen Reactions   Nitrofurantoin     Other reaction(s): Other (see comments)   Hydrocodone-Acetaminophen Rash    Other reaction(s): Other (See Comments)   Levofloxacin Rash    Other reaction(s): Other (see comments)   Sulfamethoxazole-Trimethoprim Rash    Other reaction(s): Other (see comments)    MEDICATION LIST PRIOR TO VISIT: Current Meds  Medication Sig   Accu-Chek FastClix Lancets MISC Apply topically daily.   ACCU-CHEK GUIDE test strip daily. as directed   albuterol (VENTOLIN HFA) 108 (90 Base) MCG/ACT inhaler Inhale 1 puff into the lungs every 6 (six) hours as needed for wheezing or shortness of breath.   atorvastatin (LIPITOR) 80 MG tablet TAKE ONE TABLET BY MOUTH EVERYDAY AT BEDTIME   busPIRone (BUSPAR) 10 MG tablet Take 1 tablet by mouth in the morning and at bedtime.   clopidogrel (PLAVIX) 75 MG tablet TAKE ONE TABLET BY MOUTH EVERY MORNING   Diclofenac Sodium 3 % GEL Apply topically.   DULoxetine (CYMBALTA) 30 MG capsule Take 60 mg by mouth daily.   furosemide (LASIX) 20 MG tablet Take 1 tablet (20 mg total) by mouth daily as needed (shortness of breath, swelling, weight gain).   gabapentin (NEURONTIN) 600 MG tablet Take 1 capsule by mouth 3 (three) times daily.    magnesium oxide (MAG-OX) 400 MG tablet Take 1 tablet by mouth daily at 12 noon.   metoprolol succinate (TOPROL-XL) 25 MG 24 hr tablet TAKE ONE  TABLET BY MOUTH ONCE DAILY   naloxone (NARCAN) nasal spray 4 mg/0.1 mL SMARTSIG:1 Spray(s) Both Nares 1 to 2 Times Daily   omeprazole (PRILOSEC) 40 MG capsule Take 1 capsule by mouth daily.   oxyCODONE-acetaminophen (PERCOCET) 10-325 MG tablet Take by mouth. Take 1 tablet by mouth in the morning and 1  tablet at noon and 1 tablet in the evening and 1 tablet before bedtime   prazosin (MINIPRESS) 1 MG capsule Take 1 mg by mouth at bedtime.   QUEtiapine (SEROQUEL) 300 MG tablet Take by mouth at bedtime. 1 and 1/2 tablet daily   Semaglutide,0.25 or 0.5MG /DOS, (OZEMPIC, 0.25 OR 0.5 MG/DOSE,) 2 MG/1.5ML SOPN Inject 0.5 mg into the skin once a week.   VASCEPA 1 g capsule TAKE TWO CAPSULES BY MOUTH AT BREAKFAST AND AT BEDTIME   warfarin (COUMADIN) 3 MG tablet TAKE TWO TABLETS BY MOUTH each DAY AT 4pm OR AS DIRECTED by coumadin clinic   warfarin (COUMADIN) 5 MG tablet TAKE 1 TABLET BY MOUTH ONCE DAILY ON MONDAY, WEDNESDAY, AND FRIDAY   [DISCONTINUED] enoxaparin (LOVENOX) 100 MG/ML injection Inject 0.9 mLs (90 mg total) into the skin every 12 (twelve) hours for 5 days.   [DISCONTINUED] sacubitril-valsartan (ENTRESTO) 49-51 MG Take 1 tablet by mouth 2 (two) times daily.     PAST MEDICAL HISTORY: Past Medical History:  Diagnosis Date   Asthma    Cardiomyopathy ([place])    CHF (congestive heart failure) (HCC)    Chronic kidney disease    COPD (chronic obstructive pulmonary disease) (HCC)    Coronary artery disease    H/O heart valve replacement with mechanical valve    Heart attack (HCC)    Hyperlipidemia    Hypertension    Paroxysmal atrial fibrillation (HCC)    Stroke ([place])     PAST SURGICAL HISTORY: Past Surgical History:  Procedure Laterality Date   CARDIAC SURGERY     CORONARY ANGIOPLASTY WITH STENT PLACEMENT     MITRAL VALVE SURGERY      FAMILY HISTORY: The patient family history includes Diabetes in her brother, brother, and sister; HIV/AIDS in her father and mother; Hyperlipidemia in her brother, brother, and sister; Hypertension in her brother, brother, and sister.  SOCIAL HISTORY:  The patient  reports that she has been smoking cigarettes. She has been smoking an average of 1 pack per day. She has never used smokeless tobacco. She reports that she does not currently use alcohol.  She reports that she does not use drugs.  REVIEW OF SYSTEMS: Review of Systems  Constitutional: Positive for decreased appetite. Negative for chills and fever.  HENT:  Negative for hoarse voice and nosebleeds.   Eyes:  Negative for discharge, double vision and pain.  Cardiovascular:  Positive for dyspnea on exertion (improved). Negative for chest pain, claudication, leg swelling, near-syncope, orthopnea, palpitations, paroxysmal nocturnal dyspnea and syncope.  Respiratory:  Negative for hemoptysis and shortness of breath.   Musculoskeletal:  Negative for muscle cramps and myalgias.  Gastrointestinal:  Negative for abdominal pain, constipation, diarrhea, hematemesis, hematochezia, melena, nausea and vomiting.  Genitourinary:        Vaginal spotting  Neurological:  Negative for dizziness, focal weakness, light-headedness, loss of balance and paresthesias.    PHYSICAL EXAM:    05/21/2022    9:05 AM 05/14/2022    5:30 PM 05/14/2022    4:45 PM  Vitals with BMI  Height 5\' 0"     Weight 199 lbs    BMI [id number]    Systolic  94 0000000 98  Diastolic 55 67 55  Pulse 85 70 68   Physical Exam  Constitutional: No distress.  Age appropriate, hemodynamically stable.   HENT:  Arcus senilis  Neck: No JVD present.  Cardiovascular: Normal rate, regular rhythm, S1 normal, S2 normal, intact distal pulses and normal pulses. Exam reveals no gallop, no S3 and no S4.  No murmur heard. Mechanical click  Pulmonary/Chest: Effort normal and breath sounds normal. No stridor. She has no wheezes. She has no rales.  Abdominal: Soft. Bowel sounds are normal. She exhibits no distension. There is no abdominal tenderness.  Musculoskeletal:        General: No edema.     Cervical back: Neck supple.  Neurological: She is alert and oriented to person, place, and time. She has intact cranial nerves (2-12).  Skin: Skin is warm and moist.   CARDIAC DATABASE:d Status post SJM mechanical mitral valve placement in March 2002  at [facility] due to rheumatic mitral stenosis.  EKG: 05/21/2022: Sinus rhythm, 83 bpm.  Echocardiogram: 08/30/2021:  Left ventricle cavity is normal in size. Mild concentric hypertrophy of the left ventricle. Abnormal septal wall motion due to post-operative valve. Moderate global hypokinesis. LVEF 35-40%. Indeterminate diastolic filling pattern.  Left atrial cavity is severely dilated.  S/p St Jude mechanical mitral valve. Well seated valve. No significant prosthetic valve stenosis. Mean PG 3 mmHg, MVA 2.1 cm2 by PHT method.   Trace mitral regurgitation.  No evidence of pulmonary hypertension.  Compared to previous study on 07/25/2020, no significant change noted.   Stress Testing: Outside records requested.   Heart Catheterization: November 2019 per report: Proximal LAD 20%. LCx stent patent.  Discrete 20% narrowing noted distal to the stent.  A separate 30% stenosis was noted in the distal LCx. RCA: Diffuse 20-30% narrowing in the proximal/mid/distal segments. Posterior atrioventricular branch 50 to 60% stenosis in its distal segment prior to the origin of the most distal posterolateral branch. Right PDA smooth tubular lesions with 60 to 70% luminal narrowing.    Lower Extremity Venous Duplex Left leg 06/03/2020: No evidence of deep vein thrombosis of the left lower extremity with normal venous return.  LABORATORY DATA:    Latest Ref Rng & Units 05/14/2022    2:14 PM 10/05/2021    9:13 AM 09/01/2021    9:18 AM  CBC  WBC 4.0 - 10.5 K/uL 6.4     Hemoglobin 12.0 - 15.0 g/dL 11.8  13.4  12.6   Hematocrit 36.0 - 46.0 % 36.4  40.7  38.2   Platelets 150 - 400 K/uL 360          Latest Ref Rng & Units 05/14/2022    2:14 PM 11/20/2021    9:18 AM 10/05/2021    9:13 AM  CMP  Glucose 70 - 99 mg/dL 109  132  121   BUN 8 - 23 mg/dL 14  8  10    Creatinine 0.44 - 1.00 mg/dL 1.26  1.06  1.20   Sodium 135 - 145 mmol/L 140  141  142   Potassium 3.5 - 5.1 mmol/L 4.2  4.4  3.9   Chloride  98 - 111 mmol/L 108  105  105   CO2 22 - 32 mmol/L 25  22  22    Calcium 8.9 - 10.3 mg/dL 8.5  9.2  9.0   Total Protein 6.0 - 8.5 g/dL  6.9  7.1   Total Bilirubin 0.0 - 1.2 mg/dL  0.3  0.2  Alkaline Phos 44 - 121 IU/L  113  101   AST 0 - 40 IU/L  17  17   ALT 0 - 32 IU/L  13  7     Lipid Panel     Component Value Date/Time   CHOL 165 11/20/2021 0917   TRIG 143 11/20/2021 0917   HDL 44 11/20/2021 0917   LDLCALC 96 11/20/2021 0917   LDLDIRECT 97 11/20/2021 0919   LABVLDL 25 11/20/2021 0917    No results found for: "HGBA1C" No components found for: "NTPROBNP" No results found for: "TSH"  BMP Recent Labs    10/05/21 0913 11/20/21 0918 05/14/22 1414  NA 142 141 140  K 3.9 4.4 4.2  CL 105 105 108  CO2 22 22 25   GLUCOSE 121* 132* 109*  BUN 10 8 14   CREATININE 1.20* 1.06* 1.26*  CALCIUM 9.0 9.2 8.5*  GFRNONAA  --   --  48*   CBC Recent Labs  Lab 05/14/22 1414  WBC 6.4  RBC 3.79*  HGB 11.8*  HCT 36.4  PLT 360  MCV 96.0  MCH 31.1  MCHC 32.4  RDW 15.9*    HEMOGLOBIN A1C No results found for: "HGBA1C", "MPG"  Cardiac Panel (last 3 results) No results for input(s): "CKTOTAL", "CKMB", "TROPONINI", "RELINDX" in the last 8760 hours. No results for input(s): "TROPIPOC" in the last 8760 hours.  BNP (last 3 results) Recent Labs    08/18/21 0947 09/01/21 0902  PROBNP 288* 213   TSH No results for input(s): "TSH" in the last 8760 hours.  CHOLESTEROL Recent Labs    09/01/21 0918 10/05/21 0913 11/20/21 0917  CHOL 351* 274* 165    Hepatic Function Panel Recent Labs    09/01/21 0918 10/05/21 0913 11/20/21 0918  PROT 7.1 7.1 6.9  ALBUMIN 4.1 4.1 4.1  AST 18 17 17   ALT 14 7 13   ALKPHOS 95 101 113  BILITOT 0.2 0.2 0.3    External Labs: Collected: 06/10/2019 Creatinine 1.06 mg/dL. eGFR: 66 mL/min per 1.73 m Lipid profile: Total cholesterol 304, triglycerides 310, HDL 53, LDL 190 Hemoglobin A1c: 6.3 TSH: 1.26 and T4 0.68 (free)  IMPRESSION:     ICD-10-CM   1. Paroxysmal atrial fibrillation (HCC)  I48.0 EKG 12-Lead    2. Monitoring for long-term anticoagulant use  Z51.81    Z79.01     3. H/O mitral valve replacement with mechanical valve  Z95.2     4. S/P mitral valve replacement  Z95.2     5. Need for prophylactic antibiotic  Z79.2     6. Atherosclerosis of native coronary artery of native heart without angina pectoris  I25.10     7. History of coronary angioplasty with insertion of stent  Z95.5     8. Chronic HFrEF (heart failure with reduced ejection fraction) (HCC)  I50.22 sacubitril-valsartan (ENTRESTO) 49-51 MG    9. Ischemic cardiomyopathy  I25.5     10. Pure hypercholesterolemia  E78.00     11. Cigarette smoker  F17.210     12. History of stroke  Z86.73     13. Benign hypertension  I10     14. Class 2 severe obesity due to excess calories with serious comorbidity and body mass index (BMI) of 38.0 to 38.9 in adult [facility])  E66.01    Z68.38        RECOMMENDATIONS: Joanna Martinez is a 62 y.o. 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  Paroxysmal atrial fibrillation (HCC) Monitoring for long-term anticoagulant use H/O mitral valve replacement with mechanical valve Rate control: Metoprolol. Rhythm control: N/A. Thromboembolic prophylaxis: Coumadin, goal INR 2.5-3.5 in the setting of mechanical mitral valve and paroxysmal A-fib. Has chronic vaginal spotting for which she is seeing GYN later this month. Otherwise does not endorse  any new source of bleeding.   CHA2DS2-VASc SCORE is 6 which correlates to 9.8% risk of stroke per year. Since INR is now above normal limits discontinue Lovenox for now. I have asked her to hold Coumadin today and resume 5 mg every day.  Follow-up INR check in 1 week  S/P mitral valve replacement Need for prophylactic antibiotic Mechanical acute noted on physical examination. Reemphasized importance of keeping her INR therapeutic to prevent valve thrombosis. Patient states that coming to the office is becoming difficult due to long travel times she plans to see cardiology closer to home.  Until then we will continue to provide her care during this transition period.  Atherosclerosis of native coronary artery of native heart without angina pectoris History of coronary angioplasty with insertion of stent Chronic HFrEF (heart failure with reduced ejection fraction) (HCC) Ischemic cardiomyopathy Denies anginal discomfort. No use of sublingual nitroglycerin tablets since the last office visit. Denies heart failure symptoms. Unfortunately, continues to smoke 1 pack/day Reemphasized the importance of complete smoking cessation of all nicotine products, lipid management, and weight loss. Stage C, NYHA class II. In the past Wilder Glade was discontinued due to frequent UTIs. I have asked her to hold [facility] for the next 48 hours.  I have asked her to start checking her blood pressures at home and to hold Entresto if SBP less than 100 mmHg. She already uses Lasix on as needed basis.  Pure hypercholesterolemia Improving. Continue Vascepa, Repatha, atorvastatin  Cigarette smoker Tobacco cessation counseling: Currently smoking 1 packs/day   Patient was informed of the dangers of tobacco abuse including stroke, cancer, and MI, as well as benefits of tobacco cessation. Patient is not willing to quit at this time. 7 mins were spent counseling patient cessation techniques. We discussed various methods  to help quit smoking, including deciding on a date to quit, joining a support group, pharmacological agents- nicotine gum/patch/lozenges.  I will reassess her progress at the next follow-up visit  Benign hypertension Office blood pressures are soft. Orthostatic vital signs negative. Change in Entresto dose as discussed above. Encouraged her to keep blood pressure logs regularly.  Class 2 severe obesity due to excess calories with serious comorbidity and body mass index (BMI) of 38.0 to 38.9 in adult [facility]) Body mass index is 38.86 kg/m. I reviewed with her importance of diet, regular physical activity/exercise, weight loss.   Patient is educated on the importance of increasing physical activity gradually as tolerated with a goal of moderate intensity exercise for 30 minutes a day 5 days a week.  Since last INR check she had gone to the ED for consideration of medicine admit for IV heparin as she was getting subcutaneous ecchymosis due to Lovenox injections and her INR remains subtherapeutic.  However, she was not admitted.  Urine analysis was checked and she did not have  a UTI.  Since then patient states that she did follow-up with PCP and was noted to have a UTI.  I have asked her to make sure she it is UTI as opposed to colonization.  If antibiotics are truly indicated she can proceed forward but she is asked to call the office so that the Coumadin can be readjusted.  FINAL MEDICATION LIST END OF ENCOUNTER: Meds ordered this encounter  Medications   sacubitril-valsartan (ENTRESTO) 49-51 MG    Sig: Take 1 tablet by mouth 2 (two) times daily. Hold if systolic blood pressure (top number) less than 100 mmHg or pulse less than 60 bpm.    Dispense:  60 tablet    Refill:  5    This prescription was filled on 09/06/2021. Any refills authorized will be placed on file.     Current Outpatient Medications:    Accu-Chek FastClix Lancets MISC, Apply topically daily., Disp: , Rfl:    ACCU-CHEK GUIDE  test strip, daily. as directed, Disp: , Rfl:    albuterol (VENTOLIN HFA) 108 (90 Base) MCG/ACT inhaler, Inhale 1 puff into the lungs every 6 (six) hours as needed for wheezing or shortness of breath., Disp: , Rfl:    atorvastatin (LIPITOR) 80 MG tablet, TAKE ONE TABLET BY MOUTH EVERYDAY AT BEDTIME, Disp: 90 tablet, Rfl: 2   busPIRone (BUSPAR) 10 MG tablet, Take 1 tablet by mouth in the morning and at bedtime., Disp: , Rfl:    clopidogrel (PLAVIX) 75 MG tablet, TAKE ONE TABLET BY MOUTH EVERY MORNING, Disp: 90 tablet, Rfl: 2   Diclofenac Sodium 3 % GEL, Apply topically., Disp: , Rfl:    DULoxetine (CYMBALTA) 30 MG capsule, Take 60 mg by mouth daily., Disp: , Rfl:    furosemide (LASIX) 20 MG tablet, Take 1 tablet (20 mg total) by mouth daily as needed (shortness of breath, swelling, weight gain)., Disp: 30 tablet, Rfl: 0   gabapentin (NEURONTIN) 600 MG tablet, Take 1 capsule by mouth 3 (three) times daily. , Disp: , Rfl:    magnesium oxide (MAG-OX) 400 MG tablet, Take 1 tablet by mouth daily at 12 noon., Disp: , Rfl:    metoprolol succinate (TOPROL-XL) 25 MG 24 hr tablet, TAKE ONE TABLET BY MOUTH ONCE DAILY, Disp: 90 tablet, Rfl: 2   naloxone (NARCAN) nasal spray 4 mg/0.1 mL, SMARTSIG:1 Spray(s) Both Nares 1 to 2 Times Daily, Disp: , Rfl:    omeprazole (PRILOSEC) 40 MG capsule, Take 1 capsule by mouth daily., Disp: , Rfl:    oxyCODONE-acetaminophen (PERCOCET) 10-325 MG tablet, Take by mouth. Take 1 tablet by mouth in the morning and 1 tablet at noon and 1 tablet in the evening and 1 tablet before bedtime, Disp: , Rfl:    prazosin (MINIPRESS) 1 MG capsule, Take 1 mg by mouth at bedtime., Disp: , Rfl:    QUEtiapine (SEROQUEL) 300 MG tablet, Take by mouth at bedtime. 1 and 1/2 tablet daily, Disp: , Rfl:    Semaglutide,0.25 or 0.5MG /DOS, (OZEMPIC, 0.25 OR 0.5 MG/DOSE,) 2 MG/1.5ML SOPN, Inject 0.5 mg into the skin once a week., Disp: , Rfl:    VASCEPA 1 g capsule, TAKE TWO CAPSULES BY MOUTH AT BREAKFAST AND  AT BEDTIME, Disp: 120 capsule, Rfl: 2   warfarin (COUMADIN) 3 MG tablet, TAKE TWO TABLETS BY MOUTH each DAY AT 4pm OR AS DIRECTED by coumadin clinic, Disp: 480 tablet, Rfl: 0   warfarin (COUMADIN) 5 MG tablet, TAKE 1 TABLET BY MOUTH ONCE DAILY ON MONDAY, WEDNESDAY, AND  FRIDAY, Disp: 90 tablet, Rfl: 0   sacubitril-valsartan (ENTRESTO) 49-51 MG, Take 1 tablet by mouth 2 (two) times daily. Hold if systolic blood pressure (top number) less than 100 mmHg or pulse less than 60 bpm., Disp: 60 tablet, Rfl: 5  Orders Placed This Encounter  Procedures   EKG 12-Lead    There are no Patient Instructions on file for this visit.   --Continue cardiac medications as reconciled in final medication list. --Return in about 6 months (around 11/21/2022) for Follow up, heart failure management., Review test results. Or sooner if needed. --Continue follow-up with your primary care physician regarding the management of your other chronic comorbid conditions.  Patient's questions and concerns were addressed to her satisfaction. She voices understanding of the instructions provided during this encounter.   This note was created using a voice recognition software as a result there may be grammatical errors inadvertently enclosed that do not reflect the nature of this encounter. Every attempt is made to correct such errors.   Rex Kras, [place], [facility]  Pager:  [phone] Office: [phone]

## 2022-05-21 NOTE — Progress Notes (Signed)
Anti-Coagulation Progress Note  Joanna Martinez is a 62 y.o. female who is currently on an anti-coagulation regimen for  paroxysmal atrial fibrillation, mechanical mitral valve  RECENT RESULTS:  Lab Results  Component Value Date   INR 3.9 05/21/2022   INR 1.3 (H) 05/14/2022   INR 1.4 (A) 05/14/2022    ANTI-COAG DOSE: Description   05/21/2022  INR today was 3.9. Patient goal level is (2.5-3.5) Prior dose was 5 mg everyday along with 5 days of lovenox. patient will now start back on 0 mg on Monday , and 5 mg all the other days.  Pt will recheck INR 1 weeks on 05/28/2022.  Lab Results      Component                Value               Date                      INR                      3.9                 05/21/2022                INR                      1.3 (H)             05/14/2022                INR                      1.4 (A)             05/14/2022            Paroxysmal atrial fibrillation (HCC)  (primary encounter diagnosis) Plan: POCT INR  Monitoring for long-term anticoagulant use        ASSESSMENT:   ICD-10-CM   1. Paroxysmal atrial fibrillation (HCC)  I48.0 POCT INR    2. Monitoring for long-term anticoagulant use  Z51.81    Z79.01     3. H/O mitral valve replacement with mechanical valve  Z95.2     4. Long term (current) use of anticoagulants  Z79.01       PLAN: INR is 3.9 today.  Currently on 5mg  coumadin every day.  Starting Today: Monday hold and 5mg  Tuesday through the Sunday. Hold Lovenox  FOLLOW-UP Return in about 1 week (around 05/28/2022) for INR check.  Rex Kras, [place], [facility]  Pager:  [phone] Office: [phone]

## 2022-05-28 ENCOUNTER — Ambulatory Visit: Payer: Medicaid Other | Admitting: Cardiology

## 2022-05-28 DIAGNOSIS — Z952 Presence of prosthetic heart valve: Secondary | ICD-10-CM

## 2022-05-28 DIAGNOSIS — I480 Paroxysmal atrial fibrillation: Secondary | ICD-10-CM

## 2022-05-28 DIAGNOSIS — Z5181 Encounter for therapeutic drug level monitoring: Secondary | ICD-10-CM

## 2022-05-28 LAB — POCT INR: POC INR: 3.2

## 2022-05-28 NOTE — Progress Notes (Signed)
Anti-Coagulation Progress Note  Joanna Martinez is a 62 y.o. female who is currently on an anti-coagulation regimen for  paroxysmal atrial fibrillation, mechanical mitral valve  RECENT RESULTS:  Lab Results  Component Value Date   INR 3.2 05/28/2022   INR 3.9 05/21/2022   INR 1.3 (H) 05/14/2022    ANTI-COAG DOSE: Description   05/28/2022    INR today was 3.2. Patient goal level is (2.5-3.5)  Prior dose was  0 mg on Monday , and 5 mg all the other days.   Patient will start on 2.5 mg on Monday Wednesday and 5 mg all other days.   Pt will recheck INR 2 weeks on 04/08.   Lab Results      Component                Value               Date                      INR                      3.2                 05/28/2022                INR                      3.9                 05/21/2022                INR                      1.3 (H)             05/14/2022             Paroxysmal atrial fibrillation (HCC)  (primary encounter diagnosis) Plan: POCT INR  Monitoring for long-term anticoagulant use    Total decrease is 2.5 mg /week   ASSESSMENT:   ICD-10-CM   1. Paroxysmal atrial fibrillation (HCC)  I48.0 POCT INR    2. Monitoring for long-term anticoagulant use  Z51.81 POCT INR   Z79.01     3. H/O mitral valve replacement with mechanical valve  Z95.2 POCT INR

## 2022-06-08 ENCOUNTER — Ambulatory Visit: Payer: Medicaid Other | Admitting: Cardiology

## 2022-06-08 ENCOUNTER — Telehealth: Payer: Self-pay

## 2022-06-08 NOTE — Telephone Encounter (Signed)
Patient weight is overall fairly stable.   Weight lb -- 198.5 (193.2 - 204.4)

## 2022-06-08 NOTE — Telephone Encounter (Signed)
[name]   Joanna Martinez [place], DO, [facility]

## 2022-06-11 ENCOUNTER — Ambulatory Visit: Payer: Medicaid Other

## 2022-06-12 ENCOUNTER — Ambulatory Visit: Payer: Medicaid Other | Admitting: Cardiology

## 2022-06-12 DIAGNOSIS — I480 Paroxysmal atrial fibrillation: Secondary | ICD-10-CM

## 2022-06-12 DIAGNOSIS — Z5181 Encounter for therapeutic drug level monitoring: Secondary | ICD-10-CM

## 2022-06-13 ENCOUNTER — Other Ambulatory Visit: Payer: Self-pay

## 2022-06-13 MED ORDER — WARFARIN SODIUM 5 MG PO TABS: 5.0000 mg | ORAL_TABLET | Freq: Every day | ORAL | 1 refills | Status: DC

## 2022-06-13 NOTE — Progress Notes (Addendum)
Anti-Coagulation Progress Note  Joanna Martinez is a 62 y.o. female who is currently on an anti-coagulation regimen for  paroxysmal atrial fibrillation, mechanical mitral valve  RECENT RESULTS:  Lab Results  Component Value Date   INR 2.1 06/12/2022   INR 3.2 05/28/2022   INR 3.9 05/21/2022    ANTI-COAG DOSE: Description   06/12/2022  INR today was 2.1. Patient goal level is (2.5-3.5)  Prior dose was patient will start on 2.5 mg on Monday Wednesday, Friday and 5 mg all other days.  Patient will start on 2.5 mg on Monday Wednesday, Friday and 5 mg all other days.   Pt will recheck INR 2 weeks on 04/23  Lab Results      Component                Value               Date                      INR                      2.1                 06/12/2022                INR                      3.2                 05/28/2022                INR                      3.9                 05/21/2022             Paroxysmal atrial fibrillation (HCC)  (primary encounter diagnosis) Plan: POCT INR  Monitoring for long-term anticoagulant use     ASSESSMENT:   ICD-10-CM   1. Paroxysmal atrial fibrillation  I48.0 POCT INR    2. Monitoring for long-term anticoagulant use  Z51.81    Z79.01

## 2022-06-14 LAB — POCT INR: INR: 2.1 (ref 2.0–3.0)

## 2022-06-20 ENCOUNTER — Other Ambulatory Visit: Payer: Self-pay

## 2022-06-20 DIAGNOSIS — I5022 Chronic systolic (congestive) heart failure: Secondary | ICD-10-CM

## 2022-06-20 DIAGNOSIS — I10 Essential (primary) hypertension: Secondary | ICD-10-CM

## 2022-06-20 MED ORDER — TORSEMIDE 10 MG PO TABS: 10.0000 mg | ORAL_TABLET | Freq: Every day | ORAL | 2 refills | Status: DC

## 2022-06-20 NOTE — Progress Notes (Signed)
Per conversation with Dr. Odis Hollingshead - adding torsemide  daily with labs in 1 week due to patient weight increase and complaints of edema; denies increased dyspnea.  Patient stated she has not taken the furosemide in a long time.   06/20/22 6:09 AM 202.8    06/19/22 7:11 AM 202.4    06/18/22 6:47 AM 201.6    06/17/22 7:57 AM 201.8    06/16/22 11:59 AM 200.4    06/15/22 11:06 PM 201.6    06/14/22 5:12 AM 198.4    06/13/22 6:36 AM 196.8    06/12/22 5:12 AM 197.8

## 2022-06-26 ENCOUNTER — Ambulatory Visit: Payer: Medicaid Other | Admitting: Cardiology

## 2022-06-26 DIAGNOSIS — I480 Paroxysmal atrial fibrillation: Secondary | ICD-10-CM

## 2022-06-26 DIAGNOSIS — Z5181 Encounter for therapeutic drug level monitoring: Secondary | ICD-10-CM

## 2022-06-26 DIAGNOSIS — Z952 Presence of prosthetic heart valve: Secondary | ICD-10-CM

## 2022-06-26 LAB — POCT INR: INR: 4.5 — AB (ref 2.0–3.0)

## 2022-06-26 NOTE — Progress Notes (Signed)
Anti-Coagulation Progress Note  Joanna Martinez is a 62 y.o. female who is currently on an anti-coagulation regimen for  paroxysmal atrial fibrillation, mechanical mitral valve  RECENT RESULTS:  Lab Results  Component Value Date   INR 4.5 (A) 06/26/2022   INR 2.1 06/12/2022   INR 3.2 05/28/2022    ANTI-COAG DOSE: Description   06/26/2022    INR today was 4.5. Patient goal level is (2.5-3.5)  Prior dose was2.5 mg on Monday Wednesday, Friday and 5 mg all other days.  Patient will now start on 5 mg /25/2024              Paroxysmal atrial fibrillation (HCC)  (primary encounter diagnosis) Plan: POCT INR  Monitoring for long-term anticoagulant use     ASSESSMENT:   ICD-10-CM   1. Paroxysmal atrial fibrillation  I48.0 POCT INR    2. Monitoring for long-term anticoagulant use  Z51.81    Z79.01

## 2022-06-27 ENCOUNTER — Other Ambulatory Visit: Payer: Self-pay | Admitting: Cardiology

## 2022-06-27 DIAGNOSIS — Z955 Presence of coronary angioplasty implant and graft: Secondary | ICD-10-CM

## 2022-06-27 DIAGNOSIS — I5022 Chronic systolic (congestive) heart failure: Secondary | ICD-10-CM

## 2022-06-27 DIAGNOSIS — I255 Ischemic cardiomyopathy: Secondary | ICD-10-CM

## 2022-06-27 DIAGNOSIS — I2510 Atherosclerotic heart disease of native coronary artery without angina pectoris: Secondary | ICD-10-CM

## 2022-07-03 LAB — BASIC METABOLIC PANEL
BUN/Creatinine Ratio: 15 (ref 12–28)
BUN: 26 mg/dL (ref 8–27)
CO2: 18 mmol/L — ABNORMAL LOW (ref 20–29)
Calcium: 9.2 mg/dL (ref 8.7–10.3)
Chloride: 101 mmol/L (ref 96–106)
Creatinine, Ser: 1.73 mg/dL — ABNORMAL HIGH (ref 0.57–1.00)
Glucose: 119 mg/dL — ABNORMAL HIGH (ref 70–99)
Potassium: 4.5 mmol/L (ref 3.5–5.2)
Sodium: 139 mmol/L (ref 134–144)
eGFR: 33 mL/min/{1.73_m2} — ABNORMAL LOW (ref >59)

## 2022-07-03 LAB — PRO B NATRIURETIC PEPTIDE: NT-Pro BNP: 73 pg/mL (ref 0–287)

## 2022-07-04 NOTE — Progress Notes (Signed)
Have her reduce Entresto to 24/26 bid and labs in one week. Increase water intake by 2-3 glasses per day.   Please order labs one week out.   Brayen Bunn [place], DO, [facility]

## 2022-07-10 ENCOUNTER — Telehealth: Payer: Self-pay

## 2022-07-10 NOTE — Telephone Encounter (Signed)
Patient called and said hat she was prescribed clindamycin 300 mg by her PCP, she wanted to make sure she is able to take this medicine with her other meds. She also mentioned she needs to have an abscess removed from her back and was referred to general surgeon and wonder about her coumadin

## 2022-07-11 NOTE — Telephone Encounter (Signed)
Patient aware.

## 2022-07-13 ENCOUNTER — Ambulatory Visit: Payer: Medicaid Other | Admitting: Cardiology

## 2022-07-13 DIAGNOSIS — I480 Paroxysmal atrial fibrillation: Secondary | ICD-10-CM

## 2022-07-13 DIAGNOSIS — Z952 Presence of prosthetic heart valve: Secondary | ICD-10-CM

## 2022-07-13 DIAGNOSIS — Z5181 Encounter for therapeutic drug level monitoring: Secondary | ICD-10-CM

## 2022-07-13 DIAGNOSIS — Z7901 Long term (current) use of anticoagulants: Secondary | ICD-10-CM

## 2022-07-13 LAB — POCT INR: INR: 3.5 — AB (ref 2.0–3.0)

## 2022-07-17 NOTE — Progress Notes (Signed)
Anti-Coagulation Progress Note  Joanna Martinez is a 62 y.o. female who is currently on an anti-coagulation regimen for paroxysmal atrial fibrillation, mechanical mitral valve   RECENT RESULTS:  Lab Results  Component Value Date   INR 3.5 (A) 07/13/2022   INR 4.5 (A) 06/26/2022   INR 2.1 06/12/2022    ANTI-COAG DOSE: Description   07/13/2022  INR today was 3.5. Patient goal level is (2.5-3.5)  Prior dose was 5 mg Sunday, Thursday, Saturday, and 2.5 mg all the other days.   Provider made changes after a phone call from patient starting antibiotics on 07/11/2022 to 5mg on Sun, and 2.5 all other days.  Patient will continue same dose of 5mg on Sunday and 2.5mg all other days.   Patient will recheck INR 2 weeks on 07/27/22   Lab Results      Component                Value               Date                      INR                      3.5 (A)             07/13/2022                INR                      4.5 (A)             06/26/2022                INR                      2.1                 04 /11/2022             Paroxysmal atrial fibrillation (HCC)  (primary encounter diagnosis) Plan: POCT INR  Monitoring for long-term anticoagulant use    ASSESSMENT:   ICD-10-CM   1. Paroxysmal atrial fibrillation (HCC)  I48.0 POCT INR    2. Monitoring for long-term anticoagulant use  Z51.81    Z79.01     3. H/O St. Jude metal mechanical valve 2002  Z95.2       PLAN: Continue same dose of 5mg  on Sunday and 2.5mg  all other days.  Tessa Lerner, [place], [facility]  Pager:  [phone] Office: [phone]

## 2022-07-27 ENCOUNTER — Ambulatory Visit: Payer: Medicaid Other

## 2022-07-27 ENCOUNTER — Telehealth: Payer: Self-pay

## 2022-07-27 NOTE — Telephone Encounter (Signed)
Patient does not have record of remote BP readings since 06/26/2022. BP well controlled at that time. Patient's weight is stable at ~200 lbs. However, a gradual weight gain is noted this month.   30-day avg:196.2 (191.2 - 203.6)   07/26/22 3:30 PM 200.4    07/25/22 6:41 AM 200.6    07/23/22 6:50 AM 203.6    07/23/22 6:50 AM 203.6    07/21/22 9:25 AM 201.4    07/20/22 4:54 PM 199.2    07/20/22 6:47 AM 198.6    07/19/22 2:35 PM 198.6    07/18/22 6:07 AM 196.4    07/17/22 7:08 AM 194.4    07/16/22 8:26 AM 193.0    07/15/22 8:19 AM 194.4    07/13/22 6:06 AM 194.8    07/12/22 6:09 AM 194.8    07/11/22 6:13 AM 193.8    07/10/22 5:41 AM 195.4    07/09/22 9:57 AM 193.0    07/08/22 6:51 AM 194.8    07/07/22 4:34 AM 194.4    07/06/22 6:53 AM 194.8

## 2022-07-27 NOTE — Progress Notes (Signed)
External Labs: Collected: Jul 13, 2022 at [facility] available in Care Everywhere. Hemoglobin 11.8, hematocrit 35.6% BUN 13, creatinine 1.23. eGFR 50. Sodium 137, potassium 4.4, chloride 107, bicarb 28. AST 23, ALT 26, alkaline phosphatase 93  Renal function improved compared to 07/02/2022.   Keighan Amezcua [place], DO, [facility]

## 2022-07-27 NOTE — Telephone Encounter (Signed)
Confirm that she is on torsemide 10 mg p.o. daily.  Have her take an extra 10 mg torsemide tablet Monday Wednesday and Friday until her weight is back down to 195 pounds.  Lorielle Boehning [place], DO, FACC  Britzy Graul [place], DO, [facility]

## 2022-07-31 ENCOUNTER — Other Ambulatory Visit: Payer: Self-pay | Admitting: Cardiology

## 2022-07-31 DIAGNOSIS — I5022 Chronic systolic (congestive) heart failure: Secondary | ICD-10-CM

## 2022-07-31 DIAGNOSIS — Z955 Presence of coronary angioplasty implant and graft: Secondary | ICD-10-CM

## 2022-07-31 NOTE — Progress Notes (Signed)
Called patient no answer left a vm

## 2022-08-03 ENCOUNTER — Telehealth: Payer: Self-pay

## 2022-08-03 NOTE — Progress Notes (Signed)
Patient called back and was informed about her lab results. Patient understood

## 2022-08-03 NOTE — Telephone Encounter (Signed)
Patient does not have BP readings for this month. Weight readings show a slight increase in her weight, but stable at ~200 lbs at this time.   30-day weight avg: 196.9 (193.0 - 203.6)  07/26/22 3:30 PM 200.4    07/25/22 6:41 AM 200.6    07/23/22 6:50 AM 203.6    07/23/22 6:50 AM 203.6    07/21/22 9:25 AM 201.4    07/20/22 4:54 PM 199.2    07/20/22 6:47 AM 198.6    07/19/22 2:35 PM 198.6    07/18/22 6:07 AM 196.4    07/17/22 7:08 AM 194.4    07/16/22 8:26 AM 193.0    07/15/22 8:19 AM 194.4    07/13/22 6:06 AM 194.8    07/12/22 6:09 AM 194.8    07/11/22 6:13 AM 193.8    07/10/22 5:41 AM 195.4    07/09/22 9:57 AM 193.0    07/08/22 6:51 AM 194.8    07/07/22 4:34 AM 194.4    07/06/22 6:53 AM 194.8

## 2022-08-08 ENCOUNTER — Other Ambulatory Visit: Payer: Self-pay

## 2022-08-08 ENCOUNTER — Telehealth: Payer: Self-pay

## 2022-08-08 ENCOUNTER — Ambulatory Visit: Payer: Medicaid Other | Admitting: Cardiology

## 2022-08-08 DIAGNOSIS — I480 Paroxysmal atrial fibrillation: Secondary | ICD-10-CM

## 2022-08-08 DIAGNOSIS — Z952 Presence of prosthetic heart valve: Secondary | ICD-10-CM

## 2022-08-08 DIAGNOSIS — Z5181 Encounter for therapeutic drug level monitoring: Secondary | ICD-10-CM

## 2022-08-08 DIAGNOSIS — Z7901 Long term (current) use of anticoagulants: Secondary | ICD-10-CM

## 2022-08-08 LAB — POCT INR: INR: 1.9 — AB (ref 2.0–3.0)

## 2022-08-08 MED ORDER — ENOXAPARIN SODIUM 80 MG/0.8ML IJ SOSY: 90.0000 mg | PREFILLED_SYRINGE | Freq: Two times a day (BID) | INTRAMUSCULAR | 1 refills | Status: DC

## 2022-08-08 MED ORDER — ENOXAPARIN SODIUM 80 MG/0.8ML IJ SOSY: 1.0000 mg/kg | PREFILLED_SYRINGE | Freq: Two times a day (BID) | INTRAMUSCULAR | 2 refills | Status: DC

## 2022-08-08 MED ORDER — ENOXAPARIN SODIUM 100 MG/ML IJ SOSY: 90.0000 mg | PREFILLED_SYRINGE | Freq: Two times a day (BID) | INTRAMUSCULAR | 2 refills | Status: DC

## 2022-08-08 MED ORDER — ENOXAPARIN SODIUM 80 MG/0.8ML IJ SOSY: 90.0000 mg | PREFILLED_SYRINGE | Freq: Two times a day (BID) | INTRAMUSCULAR | 2 refills | Status: DC

## 2022-08-08 NOTE — Progress Notes (Signed)
Description   08/08/2022    INR today was 1.9. Patient goal level is (2.5-3.5)  Prior dose was 5 mg Sunday and 2.5 mg all the other days.   Patient will start Lovenox 1mg/kg bid (90 mg bid). Patient will now start back on 5 mg Sunday, Thursday, Saturday, and 2.5 mg all the other days.     Pt will recheck INR 5 days on 06/10.   Lab Results      Component                Value               Date                      INR                      1.9 (A)             08/08/2022                INR                      3.5 (A)             07/13/2022                INR                      4.5 (A)             04 /23/2024             Paroxysmal atrial fibrillation (HCC)  (primary encounter diagnosis)  Monitoring for long-term anticoagulant use  H/O St. Jude metal mechanical valve 2002

## 2022-08-08 NOTE — Addendum Note (Signed)
Addended by: Elder Negus on: 08/08/2022 10:37 AM   Modules accepted: Orders

## 2022-08-08 NOTE — Progress Notes (Deleted)
Description   07/13/2022  INR today was 3.5. Patient goal level is (2.5-3.5)  Prior dose was 5 mg Sunday, Thursday, Saturday, and 2.5 mg all the other days.   Provider made changes after a phone call from patient starting antibiotics on 07/11/2022 to 5mg on Sun, and 2.5 all other days.  Patient will continue same dose of 5mg on Sunday and 2.5mg all other days.   Patient will recheck INR 2 weeks on 07/27/22   Lab Results      Component                Value               Date                      INR                      3.5 (A)             07/13/2022                INR                      4.5 (A)             06/26/2022                INR                      2.1                 04 /11/2022             Paroxysmal atrial fibrillation (HCC)  (primary encounter diagnosis) Plan: POCT INR  Monitoring for long-term anticoagulant use

## 2022-08-08 NOTE — Telephone Encounter (Signed)
Resent to [facility] pharmacy.

## 2022-08-08 NOTE — Telephone Encounter (Signed)
Pharmacy needs rx for Lovenox adjusted, 90mg  is ordered but rx is for 80mg /0.13ml. Pharmacy needs a new rx sent in to [facility], [place].

## 2022-08-13 ENCOUNTER — Ambulatory Visit: Payer: Medicaid Other | Admitting: Cardiology

## 2022-08-13 DIAGNOSIS — I480 Paroxysmal atrial fibrillation: Secondary | ICD-10-CM

## 2022-08-13 DIAGNOSIS — Z5181 Encounter for therapeutic drug level monitoring: Secondary | ICD-10-CM

## 2022-08-13 DIAGNOSIS — Z952 Presence of prosthetic heart valve: Secondary | ICD-10-CM

## 2022-08-13 LAB — POCT INR: INR: 2.8 (ref 2.0–3.0)

## 2022-08-14 NOTE — Progress Notes (Signed)
Anti-Coagulation Progress Note  Joanna Martinez is a 62 y.o. female who is currently on an anti-coagulation regimen for  paroxysmal atrial fibrillation, mechanical mitral valve  RECENT RESULTS: Lab Results  Component Value Date   INR 2.8 08/13/2022   INR 1.9 (A) 08/08/2022   INR 3.5 (A) 07/13/2022    ANTI-COAG DOSE: Description   08/13/2022  INR today was 2.8.  Patient goal level is (2.5-3.5)  Prior dose was  5 mg Sunday, Thursday, Saturday, and 2.5 mg all the other days and Lovenox 1mg/kg bid (90 mg bid).  Patient will continue 5 mg Sunday, Thursday, Saturday, and 2.5 mg all the other days, but not continue with the Lovenox.    Pt will recheck INR 2 weeks on 06/24.   Lab Results      Component                Value               Date                      INR                      2.8                 08/13/2022                INR                      1.9 (A)             08/08/2022                INR                      3.5 (A)             05 /12/2022              Paroxysmal atrial fibrillation (HCC)  (primary encounter diagnosis)  Monitoring for long-term anticoagulant use  H/O St. Jude metal mechanical valve [address] Dorisann Frames [facility]  Pager:  [phone] Office: [phone]

## 2022-08-29 ENCOUNTER — Ambulatory Visit: Payer: Medicaid Other | Admitting: Cardiology

## 2022-08-29 DIAGNOSIS — Z7901 Long term (current) use of anticoagulants: Secondary | ICD-10-CM

## 2022-08-29 DIAGNOSIS — I480 Paroxysmal atrial fibrillation: Secondary | ICD-10-CM

## 2022-08-29 LAB — POCT INR: INR: 2.2 (ref 2.0–3.0)

## 2022-08-29 NOTE — Progress Notes (Signed)
Anti-Coagulation Progress Note  Joanna Martinez is a 62 y.o. female who is currently on an anti-coagulation regimen for  paroxysmal atrial fibrillation, mechanical mitral valve  RECENT RESULTS: Lab Results  Component Value Date   INR 2.2 08/29/2022   INR 2.8 08/13/2022   INR 1.9 (A) 08/08/2022    ANTI-COAG DOSE: Description   08/29/2022  INR today was 2.2.  Patient goal level is (2.5-3.5)  Prior dose was 5 mg Sunday, Thursday, Saturday, and 2.5 mg all the other days,    Pt will recheck INR 1 month on 09/28/2022.  Lab Results      Component                Value               Date                      INR                      2.2                 08/29/2022                INR                      2.8                 08/13/2022                INR                      1.9 (A)             06 /07/2022             Paroxysmal atrial fibrillation (HCC)  (primary encounter diagnosis)  Monitoring for long-term anticoagulant use  H/O St. Jude metal mechanical valve 2002

## 2022-09-03 ENCOUNTER — Other Ambulatory Visit: Payer: Self-pay | Admitting: Cardiology

## 2022-09-03 DIAGNOSIS — I5022 Chronic systolic (congestive) heart failure: Secondary | ICD-10-CM

## 2022-09-03 NOTE — Telephone Encounter (Signed)
Patient is complaining of ankles swelling. Her weight is 197 lb. She needs a prior authorization for Repatha it has expired. I will check on this.

## 2022-09-05 NOTE — Telephone Encounter (Signed)
Working on patient assistance it has to be sent through [place].

## 2022-09-05 NOTE — Telephone Encounter (Signed)
Weight is between 197 and 199. She says that it has been staying in that range. She will continue being a patient here.

## 2022-09-05 NOTE — Telephone Encounter (Signed)
Reconciled medications with patient and let her know that we are working on patient assistance. Patient verbalized understanding.

## 2022-09-05 NOTE — Telephone Encounter (Signed)
Okay.  Carlus Stay, DO, FACC

## 2022-09-18 ENCOUNTER — Telehealth: Payer: Self-pay

## 2022-09-18 NOTE — Telephone Encounter (Signed)
Patient was prescribed nitrofurantoin for UTI she wants to make sure its ok to take

## 2022-09-18 NOTE — Telephone Encounter (Signed)
Yes

## 2022-09-18 NOTE — Telephone Encounter (Signed)
Pt aware.

## 2022-09-26 ENCOUNTER — Other Ambulatory Visit: Payer: Self-pay | Admitting: Cardiology

## 2022-09-26 DIAGNOSIS — I2510 Atherosclerotic heart disease of native coronary artery without angina pectoris: Secondary | ICD-10-CM

## 2022-09-26 DIAGNOSIS — I5022 Chronic systolic (congestive) heart failure: Secondary | ICD-10-CM

## 2022-09-28 ENCOUNTER — Ambulatory Visit: Payer: MEDICAID | Admitting: Cardiology

## 2022-09-28 DIAGNOSIS — Z952 Presence of prosthetic heart valve: Secondary | ICD-10-CM

## 2022-09-28 DIAGNOSIS — I480 Paroxysmal atrial fibrillation: Secondary | ICD-10-CM

## 2022-09-28 DIAGNOSIS — Z7901 Long term (current) use of anticoagulants: Secondary | ICD-10-CM

## 2022-09-28 LAB — POCT INR: INR: 4.2 — AB (ref 2.0–3.0)

## 2022-09-28 NOTE — Progress Notes (Signed)
Anti-Coagulation Progress Note  Joanna Martinez is a 62 y.o. female who is currently on an anti-coagulation regimen for mechanical mitral valve and atrial fibrillation  RECENT RESULTS: Lab Results  Component Value Date   INR 4.2 (A) 09/28/2022   INR 2.2 08/29/2022   INR 2.8 08/13/2022    ANTI-COAG DOSE: Description   09/28/2022    INR today was 4.2. Patient goal level is (2.5-3.5)  Current Dose:  5 mg Sunday, Thursday, Saturday, and 2.5 mg all the other days  New Dose: 5 mg Sunday, Thursday and 2.5 mg all the other days  Pt will recheck INR 2 weeks on 08/09.    Lab Results      Component                Value               Date                      INR                      4.2 (A)             09/28/2022                INR                      2.2                 08/29/2022                INR                      2.8                 06 /12/2022             Paroxysmal atrial fibrillation (HCC)  (primary encounter diagnosis)  Monitoring for long-term anticoagulant use  H/O St. Jude metal mechanical valve 2002      ASSESSMENT: Paroxysmal atrial fibrillation (HCC) H/O St. Jude metal mechanical valve 2002 Monitoring for long-term anticoagulant use  PLAN: Patient states that her oral intake is reduced New Dose: 5 mg Sunday, Thursday and 2.5 mg all the other days  FOLLOW-UP 2 weeks.  Tessa Lerner, [place], [facility]  Pager:  [phone] Office: [phone]

## 2022-09-30 ENCOUNTER — Other Ambulatory Visit: Payer: Self-pay | Admitting: Cardiology

## 2022-09-30 DIAGNOSIS — I255 Ischemic cardiomyopathy: Secondary | ICD-10-CM

## 2022-09-30 DIAGNOSIS — Z955 Presence of coronary angioplasty implant and graft: Secondary | ICD-10-CM

## 2022-09-30 DIAGNOSIS — I5022 Chronic systolic (congestive) heart failure: Secondary | ICD-10-CM

## 2022-09-30 DIAGNOSIS — I2510 Atherosclerotic heart disease of native coronary artery without angina pectoris: Secondary | ICD-10-CM

## 2022-10-12 ENCOUNTER — Other Ambulatory Visit: Payer: Self-pay

## 2022-10-12 ENCOUNTER — Ambulatory Visit: Payer: MEDICAID | Admitting: Cardiology

## 2022-10-12 DIAGNOSIS — I480 Paroxysmal atrial fibrillation: Secondary | ICD-10-CM

## 2022-10-12 DIAGNOSIS — Z7901 Long term (current) use of anticoagulants: Secondary | ICD-10-CM

## 2022-10-12 DIAGNOSIS — Z952 Presence of prosthetic heart valve: Secondary | ICD-10-CM

## 2022-10-12 DIAGNOSIS — Z5181 Encounter for therapeutic drug level monitoring: Secondary | ICD-10-CM

## 2022-10-12 LAB — POCT INR: INR: 1.7 — AB (ref 2.0–3.0)

## 2022-10-12 MED ORDER — ENOXAPARIN SODIUM 100 MG/ML IJ SOSY: 90.0000 mg | PREFILLED_SYRINGE | Freq: Two times a day (BID) | INTRAMUSCULAR | 1 refills | Status: DC

## 2022-10-12 MED ORDER — ENOXAPARIN SODIUM 100 MG/ML IJ SOSY: 90.0000 mg | PREFILLED_SYRINGE | Freq: Two times a day (BID) | INTRAMUSCULAR | Status: DC

## 2022-10-12 MED ORDER — WARFARIN SODIUM 5 MG PO TABS: 5.0000 mg | ORAL_TABLET | Freq: Every day | ORAL | 1 refills | Status: DC

## 2022-10-12 NOTE — Progress Notes (Signed)
Anti-Coagulation Progress Note  Joanna Martinez is a 62 y.o. female who is currently on an anti-coagulation regimen for  mechanical mitral valve and paroxysmal atrial fibrillation  RECENT RESULTS: Lab Results  Component Value Date   INR 1.7 (A) 10/12/2022   INR 4.2 (A) 09/28/2022   INR 2.2 08/29/2022    ANTI-COAG DOSE: Description   10/12/2022  INR today was 1.7. Patient goal level is (2.5-3.5)  Current Dose:  5 mg Sunday, Thursday and 2.5 mg all the other days  New Dose: 5mg Saturday, Sunday, Thursday and 2.5mg all other days.  Along with Lovenox 90mg bid.   Lab Results      Component                Value               Date                      INR                      1.7 (A)             10/12/2022                INR                      4.2 (A)             09/28/2022                INR                      2.2                 06 /26/2024             Paroxysmal atrial fibrillation (HCC)  (primary encounter diagnosis)  Monitoring for long-term anticoagulant use  H/O St. Jude metal mechanical valve 2002      ASSESSMENT: Paroxysmal atrial fibrillation (HCC) Monitoring for long-term anticoagulant use H/O St. Jude mechanical mitral valve 2002  PLAN: Start Lovenox 90 mg twice daily. New Coumadin dosing: 5mg  Saturday, Sunday, Thursday and 2.5mg  all other days.   FOLLOW-UP Return in about 1 week (around 10/19/2022) for INR check.  Tessa Lerner, [place], [facility]  Pager:  [phone] Office: [phone]

## 2022-10-13 ENCOUNTER — Other Ambulatory Visit: Payer: Self-pay | Admitting: Cardiology

## 2022-10-13 MED ORDER — ENOXAPARIN SODIUM 100 MG/ML IJ SOSY: 90.0000 mg | PREFILLED_SYRINGE | Freq: Two times a day (BID) | INTRAMUSCULAR | 1 refills | Status: DC

## 2022-10-19 ENCOUNTER — Ambulatory Visit: Payer: MEDICAID | Admitting: Cardiology

## 2022-10-19 DIAGNOSIS — I480 Paroxysmal atrial fibrillation: Secondary | ICD-10-CM

## 2022-10-19 DIAGNOSIS — Z5181 Encounter for therapeutic drug level monitoring: Secondary | ICD-10-CM

## 2022-10-19 LAB — POCT INR: INR: 5 — AB (ref 2.0–3.0)

## 2022-10-19 NOTE — Progress Notes (Signed)
Anti-Coagulation Progress Note  RECENT RESULTS: Lab Results  Component Value Date   INR 5.0 (A) 10/19/2022   INR 1.7 (A) 10/12/2022   INR 4.2 (A) 09/28/2022    ANTI-COAG DOSE: Description   10/19/2022  INR today was 5. Patient goal level is (2.5-3.5)  Current Dose:  5mg  Saturday, Sunday, Thursday and 2.5mg all other days.  Along with Lovenox 90mg bid.   New dose : 5mg Sun and Thurs, and 2.5 all other days, STOP Lovenox and patient instructed she can have a little bit of greens (and to keep the intake consistent).  Lab Results      Component                Value               Date                      INR                      5.0 (A)             10/19/2022                INR                      1.7 (A)             10/12/2022                INR                      4.2 (A)             07 /26/2024           Paroxysmal atrial fibrillation (HCC)  (primary encounter diagnosis)  Monitoring for long-term anticoagulant use  H/O St. Jude metal mechanical valve 2002     FOLLOW-UP Return in about 1 week (around 10/26/2022).  Tessa Lerner, [place], [facility]  Pager:  [phone] Office: [phone]

## 2022-10-26 ENCOUNTER — Ambulatory Visit: Payer: MEDICAID | Admitting: Cardiology

## 2022-10-26 DIAGNOSIS — Z5181 Encounter for therapeutic drug level monitoring: Secondary | ICD-10-CM

## 2022-10-26 DIAGNOSIS — Z7901 Long term (current) use of anticoagulants: Secondary | ICD-10-CM

## 2022-10-26 DIAGNOSIS — I480 Paroxysmal atrial fibrillation: Secondary | ICD-10-CM

## 2022-10-26 LAB — POCT INR: INR: 2.7 (ref 2.0–3.0)

## 2022-10-30 ENCOUNTER — Telehealth: Payer: Self-pay

## 2022-10-30 NOTE — Progress Notes (Signed)
No action done

## 2022-10-30 NOTE — Telephone Encounter (Signed)
She may proceed with EUS but would need bridging to heparin given her mechanical mitral valve.  Joanna Martinez [place], DO, [facility]

## 2022-10-30 NOTE — Telephone Encounter (Signed)
Patient called informing you that she is getting surgery done 12/27/22 Endoscopic ultrasound (EUS). 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 Patient would like to know if she should stop her blood thinner

## 2022-11-01 NOTE — Telephone Encounter (Signed)
Called patient and LVM.

## 2022-11-02 NOTE — Telephone Encounter (Signed)
Patient has a appointment with you 12/04/22 before her surgery. Patient states she's going to need clearance with you for the surgery.

## 2022-11-06 ENCOUNTER — Other Ambulatory Visit: Payer: Self-pay | Admitting: Cardiology

## 2022-11-06 DIAGNOSIS — I5022 Chronic systolic (congestive) heart failure: Secondary | ICD-10-CM

## 2022-11-13 ENCOUNTER — Telehealth: Payer: Self-pay

## 2022-11-13 NOTE — Telephone Encounter (Signed)
Patient understands.

## 2022-11-13 NOTE — Telephone Encounter (Signed)
Patient is made aware that her INR is may become labile and therefore she will come in for INR check as noted above.  ST

## 2022-11-13 NOTE — Telephone Encounter (Signed)
Patient called asking if she can take Azithromycin 250mg  being that she is on warfarin 3 MG . Pt has a up and coming appointment to check INR on 11/19/22.

## 2022-11-15 ENCOUNTER — Other Ambulatory Visit: Payer: Self-pay

## 2022-11-15 DIAGNOSIS — I2510 Atherosclerotic heart disease of native coronary artery without angina pectoris: Secondary | ICD-10-CM

## 2022-11-15 DIAGNOSIS — I480 Paroxysmal atrial fibrillation: Secondary | ICD-10-CM

## 2022-11-15 DIAGNOSIS — I5022 Chronic systolic (congestive) heart failure: Secondary | ICD-10-CM

## 2022-11-15 DIAGNOSIS — Z5181 Encounter for therapeutic drug level monitoring: Secondary | ICD-10-CM

## 2022-11-15 DIAGNOSIS — Z7901 Long term (current) use of anticoagulants: Secondary | ICD-10-CM

## 2022-11-15 DIAGNOSIS — I255 Ischemic cardiomyopathy: Secondary | ICD-10-CM

## 2022-11-15 DIAGNOSIS — Z955 Presence of coronary angioplasty implant and graft: Secondary | ICD-10-CM

## 2022-11-15 MED ORDER — CLOPIDOGREL BISULFATE 75 MG PO TABS: 75.0000 mg | ORAL_TABLET | Freq: Every morning | ORAL | 2 refills | Status: DC

## 2022-11-15 MED ORDER — WARFARIN SODIUM 5 MG PO TABS: 5.0000 mg | ORAL_TABLET | Freq: Every day | ORAL | 1 refills | Status: DC

## 2022-11-15 MED ORDER — ATORVASTATIN CALCIUM 80 MG PO TABS: 80.0000 mg | ORAL_TABLET | Freq: Every day | ORAL | 2 refills | Status: DC

## 2022-11-15 MED ORDER — VASCEPA 1 G PO CAPS: 1.0000 g | ORAL_CAPSULE | Freq: Two times a day (BID) | ORAL | 2 refills | Status: DC

## 2022-11-15 MED ORDER — ENTRESTO 49-51 MG PO TABS: 1.0000 | ORAL_TABLET | Freq: Two times a day (BID) | ORAL | 5 refills | Status: DC

## 2022-11-15 MED ORDER — METOPROLOL SUCCINATE ER 25 MG PO TB24: 25.0000 mg | ORAL_TABLET | Freq: Every day | ORAL | 2 refills | Status: DC

## 2022-11-15 MED ORDER — TORSEMIDE 10 MG PO TABS: 10.0000 mg | ORAL_TABLET | Freq: Every day | ORAL | 10 refills | Status: DC

## 2022-11-16 ENCOUNTER — Other Ambulatory Visit: Payer: Self-pay | Admitting: Cardiology

## 2022-11-16 DIAGNOSIS — Z955 Presence of coronary angioplasty implant and graft: Secondary | ICD-10-CM

## 2022-11-16 DIAGNOSIS — I5022 Chronic systolic (congestive) heart failure: Secondary | ICD-10-CM

## 2022-11-16 DIAGNOSIS — I2510 Atherosclerotic heart disease of native coronary artery without angina pectoris: Secondary | ICD-10-CM

## 2022-11-16 DIAGNOSIS — I255 Ischemic cardiomyopathy: Secondary | ICD-10-CM

## 2022-11-16 DIAGNOSIS — E7800 Pure hypercholesterolemia, unspecified: Secondary | ICD-10-CM

## 2022-11-16 MED ORDER — VASCEPA 1 G PO CAPS
1.0000 g | ORAL_CAPSULE | Freq: Two times a day (BID) | ORAL | 2 refills | Status: DC
Start: 2022-11-16 — End: 2023-11-29

## 2022-11-16 MED ORDER — ENTRESTO 49-51 MG PO TABS
1.0000 | ORAL_TABLET | Freq: Two times a day (BID) | ORAL | 5 refills | Status: DC
Start: 2022-11-16 — End: 2023-02-08

## 2022-11-16 MED ORDER — REPATHA SURECLICK 140 MG/ML ~~LOC~~ SOAJ
140.0000 mg | SUBCUTANEOUS | 2 refills | Status: AC
Start: 2022-11-16 — End: ?

## 2022-11-16 MED ORDER — METOPROLOL SUCCINATE ER 25 MG PO TB24
25.0000 mg | ORAL_TABLET | Freq: Every day | ORAL | 2 refills | Status: AC
Start: 2022-11-16 — End: ?

## 2022-11-16 MED ORDER — CLOPIDOGREL BISULFATE 75 MG PO TABS
75.0000 mg | ORAL_TABLET | Freq: Every morning | ORAL | 2 refills | Status: AC
Start: 2022-11-16 — End: ?

## 2022-11-16 MED ORDER — ATORVASTATIN CALCIUM 80 MG PO TABS
80.0000 mg | ORAL_TABLET | Freq: Every day | ORAL | 2 refills | Status: AC
Start: 2022-11-16 — End: ?

## 2022-11-21 ENCOUNTER — Telehealth: Payer: Self-pay | Admitting: Pharmacist

## 2022-11-21 ENCOUNTER — Encounter: Payer: Self-pay | Admitting: Pharmacist

## 2022-11-21 NOTE — Telephone Encounter (Signed)
Patient called back to confirm appt location and time.

## 2022-11-22 ENCOUNTER — Ambulatory Visit: Payer: MEDICAID | Admitting: Cardiology

## 2022-11-23 ENCOUNTER — Ambulatory Visit: Payer: MEDICAID | Attending: Cardiovascular Disease | Admitting: *Deleted

## 2022-11-23 ENCOUNTER — Other Ambulatory Visit: Payer: Self-pay

## 2022-11-23 ENCOUNTER — Telehealth: Payer: Self-pay | Admitting: *Deleted

## 2022-11-23 DIAGNOSIS — I480 Paroxysmal atrial fibrillation: Secondary | ICD-10-CM

## 2022-11-23 DIAGNOSIS — Z952 Presence of prosthetic heart valve: Secondary | ICD-10-CM

## 2022-11-23 DIAGNOSIS — Z7901 Long term (current) use of anticoagulants: Secondary | ICD-10-CM

## 2022-11-23 DIAGNOSIS — Z5181 Encounter for therapeutic drug level monitoring: Secondary | ICD-10-CM

## 2022-11-23 LAB — POCT INR: POC INR: 1.6

## 2022-11-23 MED ORDER — WARFARIN SODIUM 5 MG PO TABS: ORAL_TABLET | ORAL | 0 refills | Status: DC

## 2022-11-23 NOTE — Telephone Encounter (Addendum)
Pt needs a refill of some of her heart medications. Pt is not sure exactly which ones. Pt stated that she has been out for 1 week.    Pt stated she knows she needs a refill of Vascepa and Entresto.   Also, Pt stated she is to have a procedure on on 12/27/2022. Pt stated she is to have an endoscopy.

## 2022-11-23 NOTE — Patient Instructions (Addendum)
Description   Please use warfarin 5mg  tabs Take 1 tablet of warfarin today and tomorrow, then continue to take warfarin 1/2 a tablet daily except for 1 tablet on Sunday and Thursdays. Recheck INR in 1 week. Please call coumadin clinic at [phone].  Please be consistent with your Vitamin K intake, 2-3 servings per week.     A full discussion of the nature of anticoagulants has been carried out.  A benefit risk analysis has been presented to the patient, so that they understand the justification for choosing anticoagulation at this time. The need for frequent and regular monitoring, precise dosage adjustment and compliance is stressed.  Side effects of potential bleeding are discussed.  The patient should avoid any OTC items containing aspirin or ibuprofen, and should avoid great swings in general diet.  Avoid alcohol consumption.  Call if any signs of abnormal bleeding.

## 2022-11-25 NOTE — Telephone Encounter (Signed)
Please refill what is requested.  I had called her two weeks and refilled the ones she requested.  She will need to admitted and bridged given her mechanical valve.   Lavel Rieman [place], DO, [facility]

## 2022-11-26 ENCOUNTER — Other Ambulatory Visit: Payer: Self-pay | Admitting: Cardiology

## 2022-11-26 ENCOUNTER — Telehealth: Payer: Self-pay | Admitting: *Deleted

## 2022-11-26 ENCOUNTER — Telehealth: Payer: Self-pay | Admitting: Cardiology

## 2022-11-26 DIAGNOSIS — I2510 Atherosclerotic heart disease of native coronary artery without angina pectoris: Secondary | ICD-10-CM

## 2022-11-26 DIAGNOSIS — Z955 Presence of coronary angioplasty implant and graft: Secondary | ICD-10-CM

## 2022-11-26 DIAGNOSIS — I255 Ischemic cardiomyopathy: Secondary | ICD-10-CM

## 2022-11-26 DIAGNOSIS — I5022 Chronic systolic (congestive) heart failure: Secondary | ICD-10-CM

## 2022-11-26 NOTE — Telephone Encounter (Signed)
Will route to Dr. Odis Hollingshead so he is aware to include pharm recommendation in his final preoperative assessment when he sees the patient 10/1. Dr. Odis Hollingshead will also need to provide commentary on holding Plavix as requested as well.  Will otherwise remove from preop APP box.

## 2022-11-26 NOTE — Telephone Encounter (Signed)
Name: Joanna Martinez  DOB: 1960-10-27  MRN: [id number]  Primary Cardiologist: None  Chart reviewed as part of pre-operative protocol coverage. Because of [name] past medical history and time since last visit, she will require a follow-up in-office visit in order to better assess preoperative cardiovascular risk.  Patient has an office visit scheduled on 12/04/2022 with Dr. Odis Hollingshead. Appointment notes have been updated to reflect need for pre-op evaluation.   Pre-op covering staff:  - Please contact requesting surgeon's office via preferred method (i.e, phone, fax) to inform them of need for appointment prior to surgery.  This message will also be routed to pharmacy pool for input on holding coumadin as requested below so that this information is available to the clearing provider at time of patient's appointment.   Carlos Levering, NP  11/26/2022, 2:31 PM

## 2022-11-26 NOTE — Telephone Encounter (Signed)
Patient with diagnosis of afib + mechanical mitral valve on warfarin for anticoagulation.    Procedure: Upper Endoscopic [place] with possible FNA.  Date of procedure: TBD  CrCl 52mL/min using adjusted body weight Platelet count 317K  Per office protocol, patient can hold warfarin for 5 days prior to procedure. Patient will need bridging with Lovenox (enoxaparin) around procedure. Bridge can be coordinate at [facility] where pt is followed.  **This guidance is not considered finalized until pre-operative APP has relayed final recommendations.**

## 2022-11-26 NOTE — Telephone Encounter (Signed)
She is acceptable risk for procedure.   However, given her Afib and mechanical mitral valve she is better off being admitted prior to procedure w/ Heparin bridging  and when hemostasis is achieved can be transitioned to coumadin prior to discharge.  She has poor insight and non compliance in past w/ follow up at times and hence this is likely the safest option for her.   Joanna Martinez Anchor, DO, [facility]

## 2022-11-26 NOTE — Telephone Encounter (Signed)
Pre-operative Risk Assessment    Patient Name: Joanna Martinez  DOB: 1960/11/13 MRN: [id number]      Request for Surgical Clearance    Procedure:   Upper Endoscopic [place] with possible FNA.  Date of Surgery:  Clearance TBD                                 Surgeon:  Not Indicated Surgeon's Group or Practice Name:  [facility] Phone number:  [phone] Fax number:  [phone]   Type of Clearance Requested:   - Medical  - Pharmacy:  Hold Clopidogrel (Plavix) and Warfarin (Coumadin) 5 days prior.   Type of Anesthesia:  Not Indicated   Additional requests/questions:  Patient has upcoming appointment with Dr. Odis Hollingshead on 12/04/2022.  Pre Op added to appt notes.  Signed, Emmit Pomfret   11/26/2022, 8:31 AM

## 2022-11-26 NOTE — Telephone Encounter (Signed)
Please advise holding Coumadin prior to upper endoscopic [place] with possible FNA.  Thank you!  DW

## 2022-11-26 NOTE — Telephone Encounter (Signed)
Called patient back.  She doesn't understand why Dr. Odis Hollingshead cannot "override things" so she can get her medicine.  And if something happens to her before she gets her medication "it is on [place]".  I reviewed twice w her that she should contact her insurance, as that is the hold up.  If she has not gotten medicines from [facility] recently, it could be that they had the meds authorized before they closed, but the patient never received them.  She told me this too much and I have her jumping through hoops and she is not going to do that.   On 11/16/22 Dr. Odis Hollingshead refilled atorvastatin, Plavix, Repatha, Toprol XL, Entresto, torsemide.  The pharmacy told her she can get some meds on 9/25 and the rest on 9/28.  She ended the call after saying she will call Medicaid tomorrow because they are closed now.

## 2022-11-26 NOTE — Telephone Encounter (Signed)
Faxed over to the requesting surgeon's office to make them aware that pt is scheduled to see Dr. Odis Hollingshead, 12/04/22, clearance will be addressed at that time.

## 2022-11-26 NOTE — Telephone Encounter (Signed)
Pt called in stating her medication was previously filled by [facility] however they closed down and everything was transferred to [facility] pharmacy. She states they are telling her it is too early for her to pick up any medication. Pt asked to call the pharmacy. I called and the rep told me she is not able to pick up until 25th. Please advise on how this can be resolved. Pt states she has been out of medication for days.

## 2022-11-26 NOTE — Telephone Encounter (Signed)
Hi Michalene,   Thanks for keeping me in the loop.   I have tried my level best to get her the cardiac medication that she has claimed to run out of. I have also sent them to the local pharmacy during my last telephone encounter as she was having issues w/ [facility].    Not sure what is the true hold up and the rate limiting factor.   I am more than happy to take care of her, her needs, and answer any questions she has but I request mutual respect from Ms. Urias as well. I have always been her advocate.   Please let me know how I can help.   Dr. Odis Hollingshead

## 2022-11-27 NOTE — Telephone Encounter (Signed)
See other messages

## 2022-11-27 NOTE — Telephone Encounter (Signed)
Name: Joanna Martinez  DOB: [birth year]  MRN: [id number]   Primary Cardiologist: Tessa Lerner, DO  Chart reviewed as part of pre-operative protocol coverage. Joanna Martinez was last seen on 05/21/2022 by Dr. Odis Hollingshead.  She is pending upper endoscopy [place] with possible FNA with [facility].  Request made to hold Coumadin 5 days prior to procedure.   Pharm D recommended holding coumadin for 5 days prior to procedure with Lovenox bridging around procedure to be coordinated by [facility].  However, per correspondence with Dr. Odis Hollingshead: "She is acceptable risk for procedure.   However, given her Afib and mechanical mitral valve she is better off being admitted prior to procedure w/ Heparin bridging  and when hemostasis is achieved can be transitioned to coumadin prior to discharge.  She has poor insight and non compliance in past w/ follow up at times and hence this is likely the safest option for her."   I will route this recommendation to the requesting party via Epic fax function and remove from pre-op pool. Please call with questions.  Carlos Levering, NP 11/27/2022, 12:04 PM

## 2022-11-29 ENCOUNTER — Ambulatory Visit: Payer: MEDICAID | Attending: Cardiology

## 2022-11-29 DIAGNOSIS — Z7901 Long term (current) use of anticoagulants: Secondary | ICD-10-CM | POA: Diagnosis not present

## 2022-11-29 DIAGNOSIS — I480 Paroxysmal atrial fibrillation: Secondary | ICD-10-CM

## 2022-11-29 DIAGNOSIS — Z952 Presence of prosthetic heart valve: Secondary | ICD-10-CM | POA: Diagnosis not present

## 2022-11-29 LAB — POCT INR: INR: 1.5 — AB (ref 2.0–3.0)

## 2022-11-29 NOTE — Patient Instructions (Signed)
Description   Please use warfarin 5mg  tabs Take 2 tablets of warfarin today, then START to take warfarin 1 tablet daily except for 1/2 tablet on Monday, Wednesday, and Friday.  Recheck INR in 1 week. Please call coumadin clinic at [phone].  Please be consistent with your Vitamin K intake, 2-3 servings per week.

## 2022-12-04 ENCOUNTER — Ambulatory Visit: Payer: MEDICAID | Attending: Cardiology | Admitting: Cardiology

## 2022-12-04 ENCOUNTER — Encounter: Payer: Self-pay | Admitting: Cardiology

## 2022-12-04 VITALS — BP 122/72 | HR 70 | Resp 16 | Ht 60.0 in | Wt 190.4 lb

## 2022-12-04 DIAGNOSIS — Z8673 Personal history of transient ischemic attack (TIA), and cerebral infarction without residual deficits: Secondary | ICD-10-CM

## 2022-12-04 DIAGNOSIS — E7800 Pure hypercholesterolemia, unspecified: Secondary | ICD-10-CM

## 2022-12-04 DIAGNOSIS — Z01810 Encounter for preprocedural cardiovascular examination: Secondary | ICD-10-CM

## 2022-12-04 DIAGNOSIS — I480 Paroxysmal atrial fibrillation: Secondary | ICD-10-CM

## 2022-12-04 DIAGNOSIS — I10 Essential (primary) hypertension: Secondary | ICD-10-CM

## 2022-12-04 DIAGNOSIS — Z952 Presence of prosthetic heart valve: Secondary | ICD-10-CM

## 2022-12-04 DIAGNOSIS — I2510 Atherosclerotic heart disease of native coronary artery without angina pectoris: Secondary | ICD-10-CM | POA: Diagnosis not present

## 2022-12-04 DIAGNOSIS — I5022 Chronic systolic (congestive) heart failure: Secondary | ICD-10-CM

## 2022-12-04 DIAGNOSIS — Z955 Presence of coronary angioplasty implant and graft: Secondary | ICD-10-CM | POA: Diagnosis not present

## 2022-12-04 DIAGNOSIS — I255 Ischemic cardiomyopathy: Secondary | ICD-10-CM

## 2022-12-04 DIAGNOSIS — F17210 Nicotine dependence, cigarettes, uncomplicated: Secondary | ICD-10-CM

## 2022-12-04 DIAGNOSIS — Z5181 Encounter for therapeutic drug level monitoring: Secondary | ICD-10-CM

## 2022-12-04 DIAGNOSIS — Z7901 Long term (current) use of anticoagulants: Secondary | ICD-10-CM

## 2022-12-04 MED ORDER — CLOPIDOGREL BISULFATE 75 MG PO TABS
75.0000 mg | ORAL_TABLET | Freq: Every morning | ORAL | 2 refills | Status: DC
Start: 2022-12-04 — End: 2023-03-08

## 2022-12-04 MED ORDER — REPATHA SURECLICK 140 MG/ML ~~LOC~~ SOAJ: 140.0000 mg | SUBCUTANEOUS | 2 refills | Status: AC

## 2022-12-04 MED ORDER — ATORVASTATIN CALCIUM 80 MG PO TABS: 80.0000 mg | ORAL_TABLET | Freq: Every day | ORAL | 2 refills | Status: AC

## 2022-12-04 NOTE — Progress Notes (Signed)
Cardiology Office Note:  .   Date:  12/04/2022  ID:  [name], DOB Oct 26, 1960, MRN [id number] PCP:  Bettey Costa, NP  Former Cardiology Providers: Dr. Zollie Beckers [phone] [facility] Providers Cardiologist:  Tessa Lerner, DO , [facility] (established care 07/02/2019) Electrophysiologist:  None  Click to update primary MD,subspecialty MD or APP then REFRESH:1}    History of Present Illness: .   Joanna Martinez is a 62 y.o. 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   Patient is being followed by the practice for management of CAD status post PCI to the LCx, mechanical mitral valve replacement in March 2002 at [facility], and paroxysmal atrial fibrillation.  Patient is being considered for upper endoscopy ultrasound and possible FNA with digestive health services and requesting that Coumadin be held for 5 days prior to procedure.  She presents today for preprocedural risk stratification and discussion.  Clinically denies anginal chest pain or heart failure symptoms.  No hospitalizations or urgent care visits in the recent past due to ACS.  No known significant valvular heart disease.  Patient is currently recovering from bronchitis for which she had green sputum production.  Review of Systems: .   Review of Systems  Cardiovascular:  Negative for chest pain, claudication, dyspnea on exertion, irregular heartbeat, leg swelling, near-syncope, orthopnea,  palpitations, paroxysmal nocturnal dyspnea and syncope.  Respiratory:  Positive for cough and sputum production. Negative for shortness of breath.   Hematologic/Lymphatic: Negative for bleeding problem.  Musculoskeletal:  Negative for muscle cramps and myalgias.  Neurological:  Negative for dizziness and light-headedness.    Studies Reviewed:   EKG: The ekg ordered today was personally reviewed by me.  EKG Interpretation Date/Time:  Tuesday December 04 2022 09:39:28 EDT Ventricular Rate:  70 PR Interval:  138 QRS Duration:  84 QT Interval:  406 QTC Calculation: 438 R Axis:   -25  Text Interpretation: Normal sinus rhythm Nonspecific ST and T wave abnormality Normal ECG When compared with ECG of 05-Jun-2020 10:33, PREVIOUS ECG IS PRESENT Confirmed by Odis Hollingshead, Dangela How ([id number]) on 12/04/2022 9:51:00 AM   Echocardiogram: 08/30/2021:  Left ventricle cavity is normal in size. Mild concentric hypertrophy of the left ventricle. Abnormal septal wall motion due to post-operative valve. Moderate global hypokinesis. LVEF 35-40%. Indeterminate diastolic filling pattern.  Left atrial cavity is severely dilated.  S/p St Jude mechanical mitral valve. Well seated valve. No significant prosthetic valve stenosis. Mean PG 3 mmHg, MVA 2.1 cm2 by PHT method.   Trace mitral regurgitation.  No evidence of pulmonary hypertension.  Compared to previous study on 07/25/2020, no significant change noted.    Heart Catheterization: November 2019 per report: Proximal LAD 20%. LCx stent patent.  Discrete 20% narrowing noted distal to the stent.  A separate 30% stenosis was noted in the distal LCx. RCA: Diffuse 20-30% narrowing in the proximal/mid/distal segments. Posterior atrioventricular branch 50 to 60% stenosis in its distal segment prior to the origin of the most distal posterolateral branch. Right PDA smooth tubular lesions with 60 to 70% luminal narrowing.     Lower Extremity Venous Duplex Left  leg 06/03/2020: No  evidence of deep vein thrombosis of the left lower extremity with normal venous return.    Risk Assessment/Calculations:    CHA2DS2-VASc Score = 5   This indicates a 7.2% annual risk of stroke. The patient's score is based upon: CHF History: 1 HTN History: 1 Diabetes History: 0 Stroke History: 2 Vascular Disease History: 0 Age Score: 0 Gender Score: 1             Labs:       Latest Ref Rng & Units 05/14/2022    2:14 PM 10/05/2021    9:13 AM 09/01/2021    9:18 AM  CBC  WBC 4.0 - 10.5 K/uL 6.4     Hemoglobin 12.0 - 15.0 g/dL 59.5  63.8  75.6   Hematocrit 36.0 - 46.0 % 36.4  40.7  38.2   Platelets 150 - 400 K/uL 360          Latest Ref Rng & Units 07/02/2022    8:50 AM 05/14/2022    2:14 PM 11/20/2021    9:18 AM  BMP  Glucose 70 - 99 mg/dL 433  295  188   BUN 8 - 27 mg/dL 26  14  8    Creatinine 0.57 - 1.00 mg/dL 4.16  6.06  3.01   BUN/Creat Ratio 12 - 28 15   8    Sodium 134 - 144 mmol/L 139  140  141   Potassium 3.5 - 5.2 mmol/L 4.5  4.2  4.4   Chloride 96 - 106 mmol/L 101  108  105   CO2 20 - 29 mmol/L 18  25  22    Calcium 8.7 - 10.3 mg/dL 9.2  8.5  9.2       Latest Ref Rng & Units 07/02/2022    8:50 AM 05/14/2022    2:14 PM 11/20/2021    9:18 AM  CMP  Glucose 70 - 99 mg/dL 601  093  235   BUN 8 - 27 mg/dL 26  14  8    Creatinine 0.57 - 1.00 mg/dL 5.73  2.20  2.54   Sodium 134 - 144 mmol/L 139  140  141   Potassium 3.5 - 5.2 mmol/L 4.5  4.2  4.4   Chloride 96 - 106 mmol/L 101  108  105   CO2 20 - 29 mmol/L 18  25  22    Calcium 8.7 - 10.3 mg/dL 9.2  8.5  9.2   Total Protein 6.0 - 8.5 g/dL   6.9   Total Bilirubin 0.0 - 1.2 mg/dL   0.3   Alkaline Phos 44 - 121 IU/L   113   AST 0 - 40 IU/L   17   ALT 0 - 32 IU/L   13     Lab Results  Component Value Date   CHOL 165 11/20/2021   HDL 44 11/20/2021   LDLCALC 96 11/20/2021   LDLDIRECT 97 11/20/2021   TRIG 143 11/20/2021   No results for input(s): "LIPOA" in the last 8760 hours. No components found for:  "NTPROBNP" Recent Labs    07/02/22 0847  PROBNP 73   No results for input(s): "TSH" in the last 8760 hours.   Physical Exam:    Today's Vitals   12/04/22 0929  BP: 122/72  Pulse: 70  Resp: 16  SpO2: 93%  Weight: 190 lb 6.4 oz (86.4 kg)  Height: 5' (1.524 m)   Body mass index is 37.18 kg/m. Wt Readings from Last 3 Encounters:  12/04/22 190 lb 6.4 oz (86.4 kg)  05/21/22 199 lb (90.3 kg)  05/14/22 198 lb (89.8 kg)    Physical Exam  Constitutional: No distress.  Age appropriate, hemodynamically stable.   HENT:  Arcus senilis  Neck: No JVD present.  Cardiovascular: Normal rate, regular rhythm, S1 normal, S2 normal, intact distal pulses and normal pulses. Exam reveals no gallop, no S3 and no S4.  No murmur heard. Mechanical click  Pulmonary/Chest: Effort normal and breath sounds normal. No stridor. She has no wheezes. She has no rales.  Abdominal: Soft. Bowel sounds are normal. She exhibits no distension. There is no abdominal tenderness.  Musculoskeletal:        General: No edema.     Cervical back: Neck supple.  Neurological: She is alert and oriented to person, place, and time. She has intact cranial nerves (2-12).  Skin: Skin is warm and moist.     Impression & Recommendation(s):  Impression:   ICD-10-CM   1. Preop cardiovascular exam  Z01.810     2. Atherosclerosis of native coronary artery of native heart without angina pectoris  I25.10 EKG 12-Lead    atorvastatin (LIPITOR) 80 MG tablet    3. History of coronary angioplasty with insertion of stent  Z95.5 atorvastatin (LIPITOR) 80 MG tablet    clopidogrel (PLAVIX) 75 MG tablet    4. Ischemic cardiomyopathy  I25.5 atorvastatin (LIPITOR) 80 MG tablet    5. Paroxysmal atrial fibrillation (HCC)  I48.0     6. Long term (current) use of anticoagulants  Z79.01     7. H/O St. Jude metal mechanical valve 2002  Z95.2     8. Monitoring for long-term anticoagulant use  Z51.81    Z79.01     9. Chronic HFrEF  (heart failure with reduced ejection fraction) (HCC)  I50.22     10. Pure hypercholesterolemia  E78.00 Evolocumab (REPATHA SURECLICK) 140 MG/ML SOAJ    11. Essential hypertension  I10     12. Cigarette smoker  F17.210     13. History of stroke  Z86.73        Recommendation(s):  Preop cardiovascular exam Patient is being considered for upper endoscopy with possible fine-needle aspiration with digestive health services. Overall considered to be acceptable risk for upcoming noncardiac procedure. With regards to management of antiplatelet/anticoagulation recommend the following: Hold Plavix 7 days prior to procedure and restart postprocedure when appropriate hemostasis is achieved. Ideally would recommend Lovenox bridging given her mechanical mitral valve and paroxysmal atrial fibrillation.  However, if she requires biopsy during her endoscopy being on Lovenox may predispose her to bleeding complications. Therefore recommended that she be admitted to the hospital and bridged with IV heparin which can be held prior to her endoscopy and restarted once her endoscopy is completed and she could be monitored for bleeding complications.  She will need to be transition back to Coumadin prior to discharge and resume follow-up with Coumadin clinic.  Patient verbalizes understanding.  Atherosclerosis of native coronary artery of native heart without angina pectoris History of coronary angioplasty with insertion of stent Denied anginal chest pain or heart failure symptoms. EKG today illustrates sinus rhythm without underlying ischemia or injury pattern. No use of sublingual nitroglycerin tablets since the last office visit. Reemphasized the importance of secondary prevention with focus on improving her modifiable cardiovascular risk factors such as glycemic control, lipid management, blood pressure control, weight loss.  Paroxysmal atrial fibrillation (HCC) Long term (current) use of  anticoagulants Rate control: Metoprolol. Rhythm control:  N/A. Thromboembolic prophylaxis: Coumadin Patient does not endorse evidence of bleeding. Reemphasized the risk, benefits, and alternatives to long-term anticoagulation.  H/O St. Jude metal mechanical valve 2002 Monitoring for long-term anticoagulant use Patient understands the importance of anticoagulation in the setting of mechanical mitral valve and paroxysmal atrial fibrillation. Goal INR between 2.5-3.5 Continue to follow with Coumadin clinic  Chronic HFrEF (heart failure with reduced ejection fraction) (HCC) Ischemic cardiomyopathy Stage C, NYHA class II: Medications reconciled. Patient states that she does have her heart failure medications and does not need refills at this time. Marcelline Deist in the past discontinued due to frequent urinary tract infections.  Pure hypercholesterolemia Currently on Repatha, atorvastatin.   She denies myalgia or other side effects. No recent lipid profile available for review.  Cigarette smoker Tobacco cessation counseling: Currently smoking 1 packs/day   She is informed of the dangers of tobacco abuse including stroke, cancer, and MI, as well as benefits of tobacco cessation. She is not willing to quit at this time. 5 mins were spent counseling patient cessation techniques. We discussed various methods to help quit smoking, including deciding on a date to quit, joining a support group, pharmacological agents- nicotine gum/patch/lozenges.  I will reassess her progress at the next follow-up visit  Orders Placed:  Orders Placed This Encounter  Procedures   EKG 12-Lead   Final Medication List:    Meds ordered this encounter  Medications   Evolocumab (REPATHA SURECLICK) 140 MG/ML SOAJ    Sig: Inject 140 mg into the skin every 14 (fourteen) days.    Dispense:  2 mL    Refill:  2   atorvastatin (LIPITOR) 80 MG tablet    Sig: Take 1 tablet (80 mg total) by mouth daily.    Dispense:  90  tablet    Refill:  2   clopidogrel (PLAVIX) 75 MG tablet    Sig: Take 1 tablet (75 mg total) by mouth every morning.    Dispense:  90 tablet    Refill:  2    This prescription was filled on 07/11/2022. Any refills authorized will be placed on file.    Medications Discontinued During This Encounter  Medication Reason   atorvastatin (LIPITOR) 80 MG tablet Reorder   Evolocumab (REPATHA SURECLICK) 140 MG/ML SOAJ Reorder   clopidogrel (PLAVIX) 75 MG tablet Reorder     Current Outpatient Medications:    Accu-Chek FastClix Lancets MISC, Apply topically daily., Disp: , Rfl:    ACCU-CHEK GUIDE test strip, daily. as directed, Disp: , Rfl:    albuterol (VENTOLIN HFA) 108 (90 Base) MCG/ACT inhaler, Inhale 1 puff into the lungs every 6 (six) hours as needed for wheezing or shortness of breath., Disp: , Rfl:    busPIRone (BUSPAR) 10 MG tablet, Take 1 tablet by mouth in the morning and at bedtime., Disp: , Rfl:    Diclofenac Sodium 3 % GEL, Apply topically., Disp: , Rfl:    DULoxetine (CYMBALTA) 30 MG capsule, Take 60 mg by mouth daily., Disp: , Rfl:    enoxaparin (LOVENOX) 100 MG/ML injection, Inject 0.9 mLs (90 mg total) into the skin every 12 (twelve) hours., Disp: 13 mL, Rfl: 1   gabapentin (NEURONTIN) 600 MG tablet, Take 1 capsule by mouth 3 (three) times daily. , Disp: , Rfl:    magnesium oxide (MAG-OX) 400 MG tablet, Take 1 tablet by mouth daily at 12 noon., Disp: , Rfl:    metoprolol succinate (TOPROL-XL) 25 MG 24 hr tablet, Take 1 tablet (25 mg total)  by mouth daily., Disp: 90 tablet, Rfl: 2   naloxone (NARCAN) nasal spray 4 mg/0.1 mL, SMARTSIG:1 Spray(s) Both Nares 1 to 2 Times Daily, Disp: , Rfl:    omeprazole (PRILOSEC) 40 MG capsule, Take 1 capsule by mouth daily., Disp: , Rfl:    oxyCODONE-acetaminophen (PERCOCET) 10-325 MG tablet, Take by mouth. Take 1 tablet by mouth in the morning and 1 tablet at noon and 1 tablet in the evening and 1 tablet before bedtime, Disp: , Rfl:    prazosin  (MINIPRESS) 1 MG capsule, Take 1 mg by mouth at bedtime., Disp: , Rfl:    QUEtiapine (SEROQUEL) 300 MG tablet, Take by mouth at bedtime. 1 and 1/2 tablet daily, Disp: , Rfl:    sacubitril-valsartan (ENTRESTO) 49-51 MG, Take 1 tablet by mouth 2 (two) times daily., Disp: 60 tablet, Rfl: 5   Semaglutide,0.25 or 0.5MG /DOS, (OZEMPIC, 0.25 OR 0.5 MG/DOSE,) 2 MG/1.5ML SOPN, Inject 0.5 mg into the skin once a week., Disp: , Rfl:    torsemide (DEMADEX) 10 MG tablet, Take 1 tablet (10 mg total) by mouth daily., Disp: 30 tablet, Rfl: 10   VASCEPA 1 g capsule, Take 1 capsule (1 g total) by mouth 2 (two) times daily., Disp: 120 capsule, Rfl: 2   warfarin (COUMADIN) 5 MG tablet, Take 1/2 a tablet to 1 tablet by mouth daily as directed by the coumadin clinic, Disp: 30 tablet, Rfl: 0   atorvastatin (LIPITOR) 80 MG tablet, Take 1 tablet (80 mg total) by mouth daily., Disp: 90 tablet, Rfl: 2   clopidogrel (PLAVIX) 75 MG tablet, Take 1 tablet (75 mg total) by mouth every morning., Disp: 90 tablet, Rfl: 2   Evolocumab (REPATHA SURECLICK) 140 MG/ML SOAJ, Inject 140 mg into the skin every 14 (fourteen) days., Disp: 2 mL, Rfl: 2  Consent:      NA  Disposition:   Return in about 6 months (around 06/04/2023) for Follow up cardiomyopathy, mechanical mitral valve, A-fib. or sooner if needed.  Her questions and concerns were addressed to her satisfaction. She voices understanding of the recommendations provided during this encounter.    Signed, Tessa Lerner, DO, [facility]  [facility]  [address] [place], [place] [address] [phone] 12/04/2022 7:00 PM

## 2022-12-04 NOTE — Patient Instructions (Signed)
Medication Instructions:  Your physician recommends that you continue on your current medications as directed. Please refer to the Current Medication list given to you today.  *If you need a refill on your cardiac medications before your next appointment, please call your pharmacy*   Lab Work: None.  If you have labs (blood work) drawn today and your tests are completely normal, you will receive your results only by: MyChart Message (if you have MyChart) OR A paper copy in the mail If you have any lab test that is abnormal or we need to change your treatment, we will call you to review the results.   Testing/Procedures: None.   Follow-Up: can do with MyChart, go to [email or web address].    Your next appointment:   6 month(s)  Provider:   Tessa Lerner, DO     Other Instructions Please call our office at [phone] and select option 7 to speak with our pre-authorization department. You will need to provide the name of the procedure you are having as well as the name of the MD performing the procedure. We will send a letter to your MD and to you advising how long you should stop taking your coumadin for your procedure. Dr. Odis Hollingshead will also advise what antibiotics you will need to take prior to your procedure.   You will need to stop your plavix for 7 days prior to your procedure.

## 2022-12-06 ENCOUNTER — Ambulatory Visit: Payer: MEDICAID | Attending: Internal Medicine

## 2022-12-06 DIAGNOSIS — Z7901 Long term (current) use of anticoagulants: Secondary | ICD-10-CM | POA: Diagnosis not present

## 2022-12-06 DIAGNOSIS — Z952 Presence of prosthetic heart valve: Secondary | ICD-10-CM | POA: Diagnosis not present

## 2022-12-06 DIAGNOSIS — I480 Paroxysmal atrial fibrillation: Secondary | ICD-10-CM | POA: Diagnosis not present

## 2022-12-06 LAB — POCT INR: INR: 2.1 (ref 2.0–3.0)

## 2022-12-06 NOTE — Patient Instructions (Addendum)
Description   Since you are having signs or symptoms of bleeding, go to the Emergency Room - Call Coumadin Clinic post visit.  Please use warfarin 5mg  tabs Plan: Take 2 tablets of warfarin today, then continue taking warfarin 1 tablet daily except for 1/2 tablet on Monday, Wednesday, and Friday.  Recheck INR in 1 week. Please call coumadin clinic at [phone].  Please be consistent with your Vitamin K intake, 2-3 servings per week.

## 2022-12-10 ENCOUNTER — Telehealth: Payer: Self-pay | Admitting: Cardiology

## 2022-12-10 ENCOUNTER — Telehealth: Payer: Self-pay | Admitting: Pharmacy Technician

## 2022-12-10 ENCOUNTER — Other Ambulatory Visit (HOSPITAL_COMMUNITY): Payer: Self-pay

## 2022-12-10 NOTE — Telephone Encounter (Signed)
Recommendations were made by Dr. Odis Hollingshead and faxed to the surgeon on 11/27/2022.  Recommendations to have heparin bridge per Dr. Odis Hollingshead.  If there are more questions would call Dr. Odis Hollingshead to discuss as his note was very specific.

## 2022-12-10 NOTE — Telephone Encounter (Signed)
Clearance addressed in 9/23 phone note and 10/01 visit. I am not sure that there is anything else that needs to be done on our end but will send to pre-op team.

## 2022-12-10 NOTE — Telephone Encounter (Signed)
Pharmacy Patient Advocate Encounter   Received notification from CoverMyMeds that prior authorization for repatha is required/requested.   Insurance verification completed.   The patient is insured through [facility] .   Per test claim: PA required; PA submitted to [employer] via CoverMyMeds Key/confirmation #/EOC [id number] Status is pending

## 2022-12-10 NOTE — Telephone Encounter (Signed)
I d/w the pre op APP Joni Reining, DNP who states all recommendations: Dr. Odis Hollingshead suggested heparin bridging per note. He was clear about this in the note and it was added to the clearance when Gavin Pound sent it to them.   If further questions may need to call Dr. Odis Hollingshead to discuss if they have questions about his recommendations.

## 2022-12-10 NOTE — Telephone Encounter (Signed)
I will forward to pre op APP for review.

## 2022-12-10 NOTE — Telephone Encounter (Signed)
Caller (Amy) wants a call back to clarify if the patient was cleared to stay on the Coumadin and Plavix.

## 2022-12-10 NOTE — Telephone Encounter (Signed)
Patient states she spoke with someone during her appointment with Dr. Odis Hollingshead and she was advised to call back when she has the name, address, and phone number of the office she is having a procedure with.   She states her procedure is scheduled for 10/24. She does not have the phone number, but the performing provider is Truitt Merle, MD and the location is [facility] Patient states if any additional information is needed to call [phone] (Dr. Aundria Rud office - ask for Amy).

## 2022-12-11 ENCOUNTER — Other Ambulatory Visit (HOSPITAL_COMMUNITY): Payer: Self-pay

## 2022-12-11 NOTE — Telephone Encounter (Signed)
Pharmacy Patient Advocate Encounter  Received notification from [facility] that Prior Authorization for repatha has been APPROVED from 12/10/22 to 04/05/23. Ran test claim, Copay is $4.00. This test claim was processed through [facility]- copay amounts may vary at other pharmacies due to pharmacy/plan contracts, or as the patient moves through the different stages of their insurance plan.   PA #/Case ID/Reference #: [id number]

## 2022-12-12 ENCOUNTER — Telehealth: Payer: Self-pay | Admitting: Cardiology

## 2022-12-12 NOTE — Telephone Encounter (Signed)
   Amy with [facility] calling, she said, pt is confused with her instructions for her preop. She said, they got the clearance from Dr. Odis Hollingshead that pt needs bringing but Dr. Lanell Matar is telling the pt she doesn't need it. Pt is very confused and getting upset and would like to get clarification. She said, if there's any question to call Dr. Lana Fish at [phone]

## 2022-12-13 ENCOUNTER — Ambulatory Visit: Payer: MEDICAID | Attending: Internal Medicine

## 2022-12-13 DIAGNOSIS — Z952 Presence of prosthetic heart valve: Secondary | ICD-10-CM | POA: Diagnosis not present

## 2022-12-13 DIAGNOSIS — I480 Paroxysmal atrial fibrillation: Secondary | ICD-10-CM

## 2022-12-13 DIAGNOSIS — Z7901 Long term (current) use of anticoagulants: Secondary | ICD-10-CM | POA: Diagnosis not present

## 2022-12-13 DIAGNOSIS — Z5181 Encounter for therapeutic drug level monitoring: Secondary | ICD-10-CM

## 2022-12-13 LAB — POCT INR: INR: 7.3 — AB (ref 2.0–3.0)

## 2022-12-13 LAB — PROTIME-INR
INR: 6.8 (ref 0.9–1.2)
Prothrombin Time: 66.2 s — ABNORMAL HIGH (ref 9.1–12.0)

## 2022-12-13 NOTE — Telephone Encounter (Signed)
I will forward this to pre op APP for review.

## 2022-12-14 ENCOUNTER — Telehealth: Payer: Self-pay | Admitting: *Deleted

## 2022-12-14 NOTE — Telephone Encounter (Signed)
Called pt regarding STAT INR results from 12/13/22; there was no answer so left a message for her to call back. Will follow up.

## 2022-12-14 NOTE — Patient Instructions (Addendum)
Description   Please use warfarin 5mg  tabs 12/14/22-STAT Lab INR 6.8. Called pt, no answer. Left message on voicemail 12/04/22 at 1030am spoke with pt and confirmed pt did not take any warfarin Thursday and advised pt not to take any warfarin today-Friday, no warfarin Saturday, then START taking warfarin 1/2 tablet daily EXCEPT 1 tablet on Tuesday, Thursday, and Saturday.  Recheck INR on Wednesday-she will confirm with transportation. Report to ER with any bleeding, falls, accidents.  Please call coumadin clinic at [phone].  Please be consistent with your Vitamin K intake, 2-3 servings per week.

## 2022-12-14 NOTE — Telephone Encounter (Signed)
Message Received: Mike Craze, NP  Tarri Fuller, CMA Caller: Unspecified (2 days ago,  2:00 PM) If she is staying on Plavix and Coumadin per surgeon, then she doesn't need bridging. If she is holding medications, she will require bridging with IV heparin in hospital per notes.         I will fax these notes to the requesting with the explanation given per Bernadene Person, NP.     Previous Messages  Advice Only (Preop follow up ) ([facility]) View All Conversations on this Encounter Joylene Grapes, NP  You22 hours ago (11:23 AM)    If she is staying on Plavix and Coumadin per surgeon, then she doesn't need bridging. If she is holding medications, she will require bridging with IV heparin in hospital per notes.   You routed conversation to Cv Div PreopYesterday (9:14 AM)   Quintella Reichert (9:14 AM)    I will forward this to pre op APP for review.       Note   Hammer, Angeline S routed conversation to [facility] days ago   Hammer, Angeline S2 days ago   AH     Amy with [facility] calling, she said, pt is confused with her instructions for her preop. She said, they got the clearance from Dr. Odis Hollingshead that pt needs bringing but Dr. Lanell Matar is telling the pt she doesn't need it. Pt is very confused and getting upset and would like to get clarification. She said, if there's any question to call Dr. Lana Fish at [phone]       Note   Amy  Lewie Chamber, Hassie Bruce

## 2022-12-18 ENCOUNTER — Telehealth: Payer: Self-pay | Admitting: Cardiology

## 2022-12-18 NOTE — Telephone Encounter (Signed)
Please see the other telephone encounter.  I called Shon Hale and Crystal both calls went to voicemail.  Rebeka Kimble [place]uh[place], DO, [facility]

## 2022-12-18 NOTE — Telephone Encounter (Signed)
Patient is calling because she is confused on her instructions for the surgery. Patient would like clarification on if she should stop her blood thinners. Patient stated she stopped her Plavix yesterday, but was not sure if that is what she should be doing. Please advise.

## 2022-12-18 NOTE — Telephone Encounter (Signed)
Shon Hale from [facility] is calling to talk with Dr. Odis Hollingshead or nurse in regards to a upcoming procedure.

## 2022-12-18 NOTE — Telephone Encounter (Signed)
I recommend that Dr. Odis Hollingshead and the provider who has scheduled the procedure talk directly to prevent further confusion.   Levi Aland, NP-C 12/18/2022, 2:16 PM [address], [address] Office [phone] Fax [phone]

## 2022-12-18 NOTE — Telephone Encounter (Signed)
I called the pt back and she is wanting to confirm which set of directions for medications before her procedure she should be following. Per the pt she is at this time following the directions that Dr. Odis Hollingshead gave her  at appt 12/04/22: Recommendation(s):  Preop cardiovascular exam Patient is being considered for upper endoscopy with possible fine-needle aspiration with digestive health services. Overall considered to be acceptable risk for upcoming noncardiac procedure. With regards to management of antiplatelet/anticoagulation recommend the following: Hold Plavix 7 days prior to procedure and restart postprocedure when appropriate hemostasis is achieved. Ideally would recommend Lovenox bridging given her mechanical mitral valve and paroxysmal atrial fibrillation.  However, if she requires biopsy during her endoscopy being on Lovenox may predispose her to bleeding complications. Therefore recommended that she be admitted to the hospital and bridged with IV heparin which can be held prior to her endoscopy and restarted once her endoscopy is completed and she could be monitored for bleeding complications.  She will need to be transition back to Coumadin prior to discharge and resume follow-up with Coumadin clinic.  Patient verbalizes understanding.  I went over this with the pt. Pt tells me that she knows Dr. Odis Hollingshead told her she could stop Plavix x 7 days prior to procedure as well as he felt that would be better for the pt to be admitted to monitor her INR levels. Pt states she held her Plavix today and yesterday.   I assured the pt that I am going to forward this to Dr. Odis Hollingshead and pre op APP for recommendations that she needs to follow. She states the surgeon's office told her she does not need to be admitted to the hospital and does not need to hold her coumadin or Plavix. Pt states she is very confused.

## 2022-12-18 NOTE — Telephone Encounter (Signed)
I called Joanna Martinez at [phone] several times and left a voicemail. She had secondary number for Crystal as well [phone] and I called her too (went to voicemail).   Valery Chance [place], DO, [facility]

## 2022-12-18 NOTE — Telephone Encounter (Signed)
I d./w pre op APP today Eligha Bridegroom, NP to go back to the original request to document new notes sent to the office today, as well as hand off the paper notes to Dr. Odis Hollingshead as well as forward this note to pre op for review.   Per new notes sent to our office today per Dr. Gillis Santa  [facility]; ph# [phone], fax# [phone].   Dr. Lanell Matar: pt is scheduled for EUS 12/27/22: pt does not need to hold Plavix or Coumadin. Will not be performing FNA. Pt will not need to be admitted.   These notes were faxed over per Wayland Salinas, RN, clinical care coordinator, ph# [phone]; fax# [phone]

## 2022-12-19 ENCOUNTER — Ambulatory Visit: Payer: MEDICAID | Attending: Cardiology

## 2022-12-19 DIAGNOSIS — Z952 Presence of prosthetic heart valve: Secondary | ICD-10-CM | POA: Diagnosis not present

## 2022-12-19 DIAGNOSIS — Z7901 Long term (current) use of anticoagulants: Secondary | ICD-10-CM | POA: Diagnosis not present

## 2022-12-19 DIAGNOSIS — I480 Paroxysmal atrial fibrillation: Secondary | ICD-10-CM | POA: Diagnosis not present

## 2022-12-19 LAB — POCT INR: INR: 2.4 (ref 2.0–3.0)

## 2022-12-19 NOTE — Patient Instructions (Signed)
Description   Please use warfarin 5mg  tabs Take 1 tablet today and then START taking warfarin 1/2 tablet daily EXCEPT 1 tablet on Tuesday and Thursday.   Recheck INR 1 week post procedure.  Report to ER with any bleeding, falls, accidents.  Please call coumadin clinic at [phone].  Please be consistent with your Vitamin K intake, 2-3 servings per week.

## 2022-12-19 NOTE — Telephone Encounter (Signed)
Thanks for the update.  IF she need another procedure will have to discuss w/ Dr. Lanell Matar at that time.  Call if questions arise.   Nadir Vasques [place], DO, [facility]

## 2022-12-19 NOTE — Telephone Encounter (Signed)
10/16 @ 1356: Shon Hale with [facility] returned call. She apologized for missing Dr Emelda Brothers call yesterday. She states now the performing MD is requesting that pt not hold her Warfarin for upcoming procedure and if biopsy is needed, another procedure will be scheduled and she will need Lovenox bridging. I made her aware I have instructed pt to continue her Warfarin and if there is any changes needed, pt will be contacted by performing provider Truitt Merle, MD's office. Performing MD's office is also sending over new clearance form with updated plan/request.

## 2022-12-19 NOTE — Telephone Encounter (Signed)
I saw pt this morning in our Coumadin Clinic. INR was 2.4. At this time, pt is still under the impression that she will be admitted prior to the upcoming procedure for Warfarin hold/heparin gtt. I provided Warfarin dosing instructions according this plan; however, if plan of care changes or pt needs different Warfarin dosing instructions or Lovenox bridge, Coumadin Clinic will need to be made aware asap. I attempted to call Shon Hale at [phone] and Crystal at [phone], no answer. Left voicemail with my best call back number to confirm pt is going to be admitted prior to procedure.  Thanks!

## 2022-12-19 NOTE — Telephone Encounter (Signed)
Caller [facility]) returned call.

## 2022-12-20 NOTE — Telephone Encounter (Signed)
I s/w the pt and went over the recommendations now, per notes FNA not going to be done at this time, only the EUS to be done at this time. Per Dr. Odis Hollingshead no need to hold Plavix or Coumadin. Pt has been made aware of these recommendations with verbal understanding.

## 2022-12-20 NOTE — Telephone Encounter (Signed)
Follow Up:    Patient says she is retuning a call from the nurse.

## 2022-12-24 NOTE — Telephone Encounter (Signed)
Patient is asking that you give her a call back. States she has questions and some conerns. Please advise

## 2022-12-25 NOTE — Telephone Encounter (Signed)
I s/w the pt and she just had questions about the procedure and her blood thinners. I explained to her that per the conversation last week I had with her; I stated as the FNA is not going to be done now, just only the scan part will be done, that Dr. Odis Hollingshead and the surgeon both agree then no need to admit and no need to hold blood thinners. Pt also says she is just not comfortable with the procedure and would like Dr. Odis Hollingshead to see if he can recommend another surgeon that does this procedure. Pt states she would feel more comfortable being admitted and monitored.   I assured the pt that I will send a message to both the surgeon office and Dr. Odis Hollingshead. Pt would like if Dr. Odis Hollingshead maybe could call her as well.

## 2022-12-27 NOTE — Telephone Encounter (Signed)
Spoke to the patient over the phone.  She states the endoscopy today went well.  They plan to repeat endoscopy in spring time.  Her questions and concerns addressed to her satisfaction.  She is strongly advised to come back to the office if any questions arise or seek a second cardiology opinion in this matter.   Reynald Woods [place], DO, [facility]

## 2023-01-02 ENCOUNTER — Ambulatory Visit: Payer: MEDICAID | Attending: Cardiology

## 2023-01-02 DIAGNOSIS — I480 Paroxysmal atrial fibrillation: Secondary | ICD-10-CM | POA: Diagnosis not present

## 2023-01-02 DIAGNOSIS — Z952 Presence of prosthetic heart valve: Secondary | ICD-10-CM | POA: Diagnosis not present

## 2023-01-02 DIAGNOSIS — Z7901 Long term (current) use of anticoagulants: Secondary | ICD-10-CM

## 2023-01-02 LAB — POCT INR: INR: 2.2 (ref 2.0–3.0)

## 2023-01-02 NOTE — Patient Instructions (Signed)
Description   Please use warfarin 5mg  tabs Take 1 tablet today and then START taking warfarin 1/2 tablet daily EXCEPT 1 tablet on Sunday, Tuesday and Thursday.   Recheck INR in 1 week.  Report to ER with any bleeding, falls, accidents.  Please call coumadin clinic at [phone].  Please be consistent with your Vitamin K intake, 2 servings per week.

## 2023-01-09 ENCOUNTER — Ambulatory Visit: Payer: MEDICAID | Attending: Cardiovascular Disease

## 2023-01-09 DIAGNOSIS — Z7901 Long term (current) use of anticoagulants: Secondary | ICD-10-CM | POA: Diagnosis not present

## 2023-01-09 DIAGNOSIS — Z952 Presence of prosthetic heart valve: Secondary | ICD-10-CM

## 2023-01-09 DIAGNOSIS — I480 Paroxysmal atrial fibrillation: Secondary | ICD-10-CM

## 2023-01-09 LAB — POCT INR: INR: 2.9 (ref 2.0–3.0)

## 2023-01-09 NOTE — Patient Instructions (Signed)
Description   Please use warfarin 5mg  tabs Continue taking warfarin 1/2 tablet daily EXCEPT 1 tablet on Sunday, Tuesday and Thursday.   Recheck INR in 1 week.  Report to ER with any bleeding, falls, accidents.  Please call coumadin clinic at [phone].  Please be consistent with your Vitamin K intake, 1 servings per week.

## 2023-01-16 ENCOUNTER — Ambulatory Visit: Payer: MEDICAID | Attending: Cardiology

## 2023-01-16 DIAGNOSIS — Z952 Presence of prosthetic heart valve: Secondary | ICD-10-CM

## 2023-01-16 DIAGNOSIS — Z7901 Long term (current) use of anticoagulants: Secondary | ICD-10-CM

## 2023-01-16 DIAGNOSIS — I480 Paroxysmal atrial fibrillation: Secondary | ICD-10-CM

## 2023-01-16 LAB — POCT INR: INR: 4.9 — AB (ref 2.0–3.0)

## 2023-01-16 NOTE — Patient Instructions (Signed)
Description   Please use warfarin 5mg  tabs HOLD today's dose and only take 1/2 tablet tomorrow and then continue taking warfarin 1/2 tablet daily EXCEPT 1 tablet on Sunday, Tuesday and Thursday.   Recheck INR in 1 week.  Report to ER with any bleeding, falls, accidents.  Please call coumadin clinic at [phone].  Please be consistent with your Vitamin K intake, 1 servings per week.

## 2023-01-22 ENCOUNTER — Ambulatory Visit: Payer: MEDICAID

## 2023-01-28 ENCOUNTER — Ambulatory Visit: Payer: MEDICAID | Attending: Cardiology

## 2023-01-28 DIAGNOSIS — Z5181 Encounter for therapeutic drug level monitoring: Secondary | ICD-10-CM

## 2023-01-28 DIAGNOSIS — Z952 Presence of prosthetic heart valve: Secondary | ICD-10-CM | POA: Diagnosis not present

## 2023-01-28 DIAGNOSIS — Z7901 Long term (current) use of anticoagulants: Secondary | ICD-10-CM

## 2023-01-28 DIAGNOSIS — I480 Paroxysmal atrial fibrillation: Secondary | ICD-10-CM | POA: Diagnosis not present

## 2023-01-28 LAB — POCT INR: INR: 2.3 (ref 2.0–3.0)

## 2023-01-28 NOTE — Patient Instructions (Signed)
Please use warfarin 5mg  tabs TAKE 1 TABLET TONIGHT ONLY THEN continue taking warfarin 1/2 tablet daily EXCEPT 1 tablet on Sunday, Tuesday and Thursday.   Recheck INR in 2 weeks.  Report to ER with any bleeding, falls, accidents.  Please call coumadin clinic at [phone].  Please be consistent with your Vitamin K intake, 1 servings per week.

## 2023-02-06 ENCOUNTER — Other Ambulatory Visit: Payer: Self-pay | Admitting: Cardiology

## 2023-02-06 DIAGNOSIS — I5022 Chronic systolic (congestive) heart failure: Secondary | ICD-10-CM

## 2023-02-11 ENCOUNTER — Ambulatory Visit: Payer: MEDICAID

## 2023-02-11 ENCOUNTER — Telehealth: Payer: Self-pay | Admitting: Cardiology

## 2023-02-11 NOTE — Telephone Encounter (Signed)
Spoke with pt and advised pt that since she rescheduled her appt today that she would continue taking the dose she took last week. Asked her if she had her after visit summary from last visit and she confirmed. Advised her that today she would take the same amount she did daily last week, which is, 12/9-1/2 tablet, 12/10-1 tablet, 12/11-1/2 tablet, 12/12-1 tablet, 12/13-appt. She was thankful for the assistance and confirmed 02/15/23 appt.

## 2023-02-11 NOTE — Telephone Encounter (Signed)
Patient calling to discuss medication. Please  advise

## 2023-02-15 ENCOUNTER — Telehealth: Payer: Self-pay

## 2023-02-15 ENCOUNTER — Ambulatory Visit: Payer: MEDICAID

## 2023-02-15 NOTE — Telephone Encounter (Signed)
Called and spoke with pt's daughter who states pt is currently at [facility] because she had a stroke. I made her aware I will cancel her coumadin clinic appt that was for today and she will need to reschedule once pt is discharged if she is instructed to continue her Warfarin.

## 2023-02-19 ENCOUNTER — Ambulatory Visit: Payer: MEDICAID | Attending: Cardiology

## 2023-02-19 DIAGNOSIS — I480 Paroxysmal atrial fibrillation: Secondary | ICD-10-CM

## 2023-02-19 DIAGNOSIS — Z7901 Long term (current) use of anticoagulants: Secondary | ICD-10-CM

## 2023-02-19 DIAGNOSIS — Z952 Presence of prosthetic heart valve: Secondary | ICD-10-CM

## 2023-02-19 DIAGNOSIS — Z5181 Encounter for therapeutic drug level monitoring: Secondary | ICD-10-CM

## 2023-02-19 LAB — POCT INR: INR: 3.8 — AB (ref 2.0–3.0)

## 2023-02-19 NOTE — Patient Instructions (Signed)
Please use warfarin 5mg  tabs HOLD TODAY ONLY THEN continue taking warfarin 1/2 tablet daily EXCEPT 1 tablet on Sunday, Tuesday and Thursday.   Recheck INR in 3 weeks.  Report to ER with any bleeding, falls, accidents.  Please call coumadin clinic at [phone].  Please be consistent with your Vitamin K intake, 1 servings per week.

## 2023-02-22 ENCOUNTER — Telehealth: Payer: Self-pay | Admitting: Cardiology

## 2023-02-22 NOTE — Telephone Encounter (Signed)
Patient calling in about medication, she has some question. Please advise

## 2023-02-22 NOTE — Telephone Encounter (Signed)
Spoke with pt regarding medication question. She was recently hospitalized and stated they discontinued her Entresto and Torsemide d/t hypotension. Pt stated her BP yesterday (12/19) was 111/54 and today (12/20) 126/66. Dr. Odis Hollingshead had no recommendations at this time and is aware of the medication changes that were made. Pt's medication list has been updated.

## 2023-03-08 ENCOUNTER — Other Ambulatory Visit: Payer: Self-pay

## 2023-03-08 DIAGNOSIS — Z955 Presence of coronary angioplasty implant and graft: Secondary | ICD-10-CM

## 2023-03-08 MED ORDER — CLOPIDOGREL BISULFATE 75 MG PO TABS: 75.0000 mg | ORAL_TABLET | Freq: Every morning | ORAL | 2 refills | Status: DC

## 2023-03-12 ENCOUNTER — Ambulatory Visit: Payer: MEDICAID | Attending: Cardiology

## 2023-03-12 DIAGNOSIS — Z5181 Encounter for therapeutic drug level monitoring: Secondary | ICD-10-CM | POA: Diagnosis not present

## 2023-03-12 DIAGNOSIS — Z7901 Long term (current) use of anticoagulants: Secondary | ICD-10-CM | POA: Diagnosis not present

## 2023-03-12 DIAGNOSIS — I480 Paroxysmal atrial fibrillation: Secondary | ICD-10-CM | POA: Diagnosis not present

## 2023-03-12 DIAGNOSIS — Z952 Presence of prosthetic heart valve: Secondary | ICD-10-CM

## 2023-03-12 LAB — POCT INR: INR: 3.3 — AB (ref 2.0–3.0)

## 2023-03-12 NOTE — Patient Instructions (Signed)
 Please use warfarin 5mg  tabs  continue taking warfarin 1/2 tablet daily EXCEPT 1 tablet on Sunday, Tuesday and Thursday.   Recheck INR in 6 weeks.  Report to ER with any bleeding, falls, accidents.  Please call coumadin  clinic at [phone].  Please be consistent with your Vitamin K intake, 1 servings per week.

## 2023-04-23 ENCOUNTER — Ambulatory Visit: Payer: MEDICAID | Attending: Cardiovascular Disease | Admitting: *Deleted

## 2023-04-23 DIAGNOSIS — Z5181 Encounter for therapeutic drug level monitoring: Secondary | ICD-10-CM

## 2023-04-23 DIAGNOSIS — Z7901 Long term (current) use of anticoagulants: Secondary | ICD-10-CM | POA: Diagnosis not present

## 2023-04-23 DIAGNOSIS — Z952 Presence of prosthetic heart valve: Secondary | ICD-10-CM

## 2023-04-23 DIAGNOSIS — I480 Paroxysmal atrial fibrillation: Secondary | ICD-10-CM

## 2023-04-23 LAB — POCT INR: INR: 1.7 — AB (ref 2.0–3.0)

## 2023-04-23 NOTE — Patient Instructions (Addendum)
Description   Please use warfarin 5mg  tabs Today take 1.5 tablets of warfarin and tomorrow take 1 tablet of warfarin then continue taking warfarin 1/2 tablet daily EXCEPT 1 tablet on Sunday, Tuesday and Thursday.   Recheck INR in 3 weeks (usually 6 weeks).  Report to ER with any bleeding, falls, accidents.  Please call coumadin clinic at [phone].  Please be consistent with your Vitamin K intake, 1 servings per week.

## 2023-05-14 ENCOUNTER — Ambulatory Visit: Payer: MEDICAID | Attending: Cardiovascular Disease | Admitting: *Deleted

## 2023-05-14 DIAGNOSIS — Z952 Presence of prosthetic heart valve: Secondary | ICD-10-CM | POA: Diagnosis not present

## 2023-05-14 DIAGNOSIS — I480 Paroxysmal atrial fibrillation: Secondary | ICD-10-CM

## 2023-05-14 DIAGNOSIS — Z5181 Encounter for therapeutic drug level monitoring: Secondary | ICD-10-CM

## 2023-05-14 DIAGNOSIS — Z7901 Long term (current) use of anticoagulants: Secondary | ICD-10-CM | POA: Diagnosis not present

## 2023-05-14 LAB — POCT INR: INR: 3.1 — AB (ref 2.0–3.0)

## 2023-05-14 NOTE — Patient Instructions (Addendum)
 Description   Please use warfarin 5mg  tabs Continue taking warfarin 1/2 tablet daily EXCEPT 1 tablet on Sunday, Tuesday and Thursday.  Recheck INR in 4 weeks (usually 6 weeks).  Report to ER with any bleeding, falls, accidents.  Please call coumadin clinic at [phone].  Please be consistent with your Vitamin K intake, 1 servings per week.

## 2023-05-31 ENCOUNTER — Ambulatory Visit: Payer: MEDICAID | Attending: Internal Medicine | Admitting: Cardiology

## 2023-05-31 ENCOUNTER — Encounter: Payer: Self-pay | Admitting: Cardiology

## 2023-05-31 VITALS — BP 110/70 | HR 79 | Resp 16 | Ht 60.0 in | Wt 186.0 lb

## 2023-05-31 DIAGNOSIS — F17210 Nicotine dependence, cigarettes, uncomplicated: Secondary | ICD-10-CM

## 2023-05-31 DIAGNOSIS — I480 Paroxysmal atrial fibrillation: Secondary | ICD-10-CM

## 2023-05-31 DIAGNOSIS — I5022 Chronic systolic (congestive) heart failure: Secondary | ICD-10-CM | POA: Diagnosis not present

## 2023-05-31 DIAGNOSIS — E7800 Pure hypercholesterolemia, unspecified: Secondary | ICD-10-CM

## 2023-05-31 DIAGNOSIS — Z955 Presence of coronary angioplasty implant and graft: Secondary | ICD-10-CM

## 2023-05-31 DIAGNOSIS — I2510 Atherosclerotic heart disease of native coronary artery without angina pectoris: Secondary | ICD-10-CM

## 2023-05-31 DIAGNOSIS — I255 Ischemic cardiomyopathy: Secondary | ICD-10-CM | POA: Diagnosis not present

## 2023-05-31 DIAGNOSIS — Z7901 Long term (current) use of anticoagulants: Secondary | ICD-10-CM

## 2023-05-31 DIAGNOSIS — Z792 Long term (current) use of antibiotics: Secondary | ICD-10-CM

## 2023-05-31 DIAGNOSIS — Z952 Presence of prosthetic heart valve: Secondary | ICD-10-CM

## 2023-05-31 DIAGNOSIS — I10 Essential (primary) hypertension: Secondary | ICD-10-CM

## 2023-05-31 MED ORDER — ENTRESTO 24-26 MG PO TABS: 1.0000 | ORAL_TABLET | Freq: Two times a day (BID) | ORAL | 3 refills | Status: DC

## 2023-05-31 NOTE — Patient Instructions (Signed)
 Medication Instructions:  Start Entresto 24-26 twice daily Hold if systolic blood pressure (top number) is less than 100 *If you need a refill on your cardiac medications before your next appointment, please call your pharmacy*  Lab Work: BMP in one week If you have labs (blood work) drawn today and your tests are completely normal, you will receive your results only by: MyChart Message (if you have MyChart) OR A paper copy in the mail If you have any lab test that is abnormal or we need to change your treatment, we will call you to review the results.  Testing/Procedures: Echocardiogram prior to 6 month follow up Your physician has requested that you have an echocardiogram. Echocardiography is a painless test that uses sound waves to create images of your heart. It provides your doctor with information about the size and shape of your heart and how well your heart's chambers and valves are working. This procedure takes approximately one hour. There are no restrictions for this procedure. Please do NOT wear cologne, perfume, aftershave, or lotions (deodorant is allowed). Please arrive 15 minutes prior to your appointment time.  Please note: We ask at that you not bring children with you during ultrasound (echo/ vascular) testing. Due to room size and safety concerns, children are not allowed in the ultrasound rooms during exams. Our front office staff cannot provide observation of children in our lobby area while testing is being conducted. An adult accompanying a patient to their appointment will only be allowed in the ultrasound room at the discretion of the ultrasound technician under special circumstances. We apologize for any inconvenience.   Follow-Up: At [facility], you and your health needs are our priority.  As part of our continuing mission to provide you with exceptional heart care, our providers are all part of one team.  This team includes your primary Cardiologist  (physician) and Advanced Practice Providers or APPs (Physician Assistants and Nurse Practitioners) who all work together to provide you with the care you need, when you need it.  Your next appointment:   6 month(s)  Provider:   Tessa Lerner, DO     We recommend signing up for the patient portal called "MyChart".  Sign up information is provided on this After Visit Summary.  MyChart is used to connect with patients for Virtual Visits (Telemedicine).  Patients are able to view lab/test results, encounter notes, upcoming appointments, etc.  Non-urgent messages can be sent to your provider as well.   To learn more about what you can do with MyChart, go to [email or web address].   Other Instructions       1st Floor: - Lobby - Registration  - Pharmacy  - Lab - Cafe  2nd Floor: - PV Lab - Diagnostic Testing (echo, CT, nuclear med)  3rd Floor: - Vacant  4th Floor: - TCTS (cardiothoracic surgery) - AFib Clinic - Structural Heart Clinic - Vascular Surgery  - Vascular Ultrasound  5th Floor: - HeartCare Cardiology (general and EP) - Clinical Pharmacy for coumadin, hypertension, lipid, weight-loss medications, and med management appointments    Valet parking services will be available as well.

## 2023-05-31 NOTE — Progress Notes (Signed)
 Cardiology Office Note:  .   Date:  05/31/2023  ID:  [name], DOB 1960-06-02, MRN [id number] PCP:  Bettey Costa, NP  Former Cardiology Providers: Dr. Zollie Beckers [phone] [facility] Providers Cardiologist:  Tessa Lerner, DO , [facility] (established care 07/02/2019) Electrophysiologist:  None  Click to update primary MD,subspecialty MD or APP then REFRESH:1}    History of Present Illness: .   Joanna Martinez is a 63 y.o. 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   Patient is being followed by the practice for management of CAD status post PCI to the LCx, mechanical mitral valve replacement in March 2002 at [facility], and paroxysmal atrial fibrillation.  Patient presents today for 77-month follow-up visit.  Denies anginal chest pain or heart failure symptoms.  She is currently on Ozempic which could be contributing to her decreased appetite.  I have asked her to follow-up with PCP and GI for further guidance.  Patient informs me that in November 2024 she was hospitalized at [facility] due to syncopal event.  It was felt that the syncopal event was likely secondary to orthostatic hypotension based on the records available in Care Everywhere.  She was having diarrhea, was on heart failure medications, and poor oral intake.  She is back to baseline.  She still continues to smoke 1 pack/day.  And follows up with Coumadin clinic for INR  checks.  Review of Systems: .   Review of Systems  Constitutional: Positive for malaise/fatigue and weight loss (4 pounds since last visit.).  HENT:         Decreased appetite  Cardiovascular:  Negative for chest pain, claudication, dyspnea on exertion, irregular heartbeat, leg swelling, near-syncope, orthopnea, palpitations, paroxysmal nocturnal dyspnea and syncope.  Respiratory:  Negative for cough, shortness of breath and sputum production.   Hematologic/Lymphatic: Negative for bleeding problem.  Musculoskeletal:  Negative for muscle cramps and myalgias.  Neurological:  Negative for dizziness and light-headedness.    Studies Reviewed:   EKG: Patient refused an EKG today.   Echocardiogram: 08/30/2021:  Left ventricle cavity is normal in size. Mild concentric hypertrophy of the left ventricle. Abnormal septal wall motion due to post-operative valve. Moderate global hypokinesis. LVEF 35-40%. Indeterminate diastolic filling pattern.  Left atrial cavity is severely dilated.  S/p St Jude mechanical mitral valve. Well seated valve. No significant prosthetic valve stenosis. Mean PG 3 mmHg, MVA 2.1 cm2 by PHT method.   Trace mitral regurgitation.  No evidence of pulmonary hypertension.  Compared to previous study on 07/25/2020, no significant change noted.    Heart Catheterization: November 2019 per report: Proximal LAD 20%. LCx stent patent.  Discrete 20% narrowing noted distal to the stent.  A separate 30% stenosis was noted in the distal LCx. RCA: Diffuse 20-30% narrowing in the proximal/mid/distal segments. Posterior atrioventricular branch 50 to 60% stenosis in its distal segment prior to the origin of the most distal posterolateral branch. Right PDA smooth tubular lesions with 60 to 70% luminal narrowing.     Lower Extremity Venous Duplex Left leg 06/03/2020: No evidence of deep vein thrombosis  of the left lower extremity with normal venous return.    Risk Assessment/Calculations:     CHA2DS2-VASc Score = 5   This indicates a 7.2% annual risk of stroke. The patient's score is based upon: CHF History: 1 HTN History: 1 Diabetes History: 0 Stroke History: 2 Vascular Disease History: 0 Age Score: 0 Gender Score: 1             Labs:       Latest Ref Rng & Units 05/14/2022    2:14 PM 10/05/2021    9:13 AM 09/01/2021    9:18 AM  CBC  WBC 4.0 - 10.5 K/uL 6.4     Hemoglobin 12.0 - 15.0 g/dL 16.1  09.6  04.5   Hematocrit 36.0 - 46.0 % 36.4  40.7  38.2   Platelets 150 - 400 K/uL 360          Latest Ref Rng & Units 07/02/2022    8:50 AM 05/14/2022    2:14 PM 11/20/2021    9:18 AM  BMP  Glucose 70 - 99 mg/dL 409  811  914   BUN 8 - 27 mg/dL 26  14  8    Creatinine 0.57 - 1.00 mg/dL 7.82  9.56  2.13   BUN/Creat Ratio 12 - 28 15   8    Sodium 134 - 144 mmol/L 139  140  141   Potassium 3.5 - 5.2 mmol/L 4.5  4.2  4.4   Chloride 96 - 106 mmol/L 101  108  105   CO2 20 - 29 mmol/L 18  25  22    Calcium 8.7 - 10.3 mg/dL 9.2  8.5  9.2       Latest Ref Rng & Units 07/02/2022    8:50 AM 05/14/2022    2:14 PM 11/20/2021    9:18 AM  CMP  Glucose 70 - 99 mg/dL 086  578  469   BUN 8 - 27 mg/dL 26  14  8    Creatinine 0.57 - 1.00 mg/dL 6.29  5.28  4.13   Sodium 134 - 144 mmol/L 139  140  141   Potassium 3.5 - 5.2 mmol/L 4.5  4.2  4.4   Chloride 96 - 106 mmol/L 101  108  105   CO2 20 - 29 mmol/L 18  25  22    Calcium 8.7 - 10.3 mg/dL 9.2  8.5  9.2   Total Protein 6.0 - 8.5 g/dL   6.9   Total Bilirubin 0.0 - 1.2 mg/dL   0.3   Alkaline Phos 44 - 121 IU/L   113   AST 0 - 40 IU/L   17   ALT 0 - 32 IU/L   13     Lab Results  Component Value Date   CHOL 165 11/20/2021   HDL 44 11/20/2021   LDLCALC 96 11/20/2021   LDLDIRECT 97 11/20/2021   TRIG 143 11/20/2021   No results for input(s): "LIPOA" in the last 8760 hours. No components found for: "NTPROBNP" Recent Labs    07/02/22 0847  PROBNP 73   No results for input(s): "TSH" in the last 8760 hours.   Physical  Exam:    Today's Vitals   05/31/23 0807  BP: 110/70  Pulse: 79  Resp: 16  SpO2: 95%  Weight: 186 lb (84.4 kg)  Height: 5' (1.524 m)   Body mass index is 36.33 kg/m. Wt Readings from Last 3 Encounters:  05/31/23 186 lb (84.4 kg)  12/04/22 190 lb 6.4  oz (86.4 kg)  05/21/22 199 lb (90.3 kg)    Physical Exam  Constitutional: No distress.  Age appropriate, hemodynamically stable.   HENT:  Arcus senilis  Neck: No JVD present.  Cardiovascular: Normal rate, regular rhythm, S1 normal, S2 normal, intact distal pulses and normal pulses. Exam reveals no gallop, no S3 and no S4.  No murmur heard. Mechanical click  Pulmonary/Chest: Effort normal and breath sounds normal. No stridor. She has no wheezes. She has no rales.  Abdominal: Soft. Bowel sounds are normal. She exhibits no distension. There is no abdominal tenderness.  Musculoskeletal:        General: No edema.     Cervical back: Neck supple.  Neurological: She is alert and oriented to person, place, and time. She has intact cranial nerves (2-12).  Skin: Skin is warm and moist.     Impression & Recommendation(s):  Impression:   ICD-10-CM   1. Chronic HFrEF (heart failure with reduced ejection fraction) (HCC)  I50.22 sacubitril-valsartan (ENTRESTO) 24-26 MG    ECHOCARDIOGRAM COMPLETE    Basic metabolic panel with GFR    Basic metabolic panel with GFR    CANCELED: Basic metabolic panel with GFR    2. Atherosclerosis of native coronary artery of native heart without angina pectoris  I25.10     3. History of coronary angioplasty with insertion of stent  Z95.5     4. Ischemic cardiomyopathy  I25.5     5. Pure hypercholesterolemia  E78.00     6. Essential hypertension  I10     7. Cigarette smoker  F17.210     8. Paroxysmal atrial fibrillation (HCC)  I48.0     9. Long term (current) use of anticoagulants  Z79.01     10. H/O St. Jude metal mechanical valve 2002  Z95.2 ECHOCARDIOGRAM COMPLETE    11. Need for prophylactic  antibiotic  Z79.2         Recommendation(s):  Chronic HFrEF (heart failure with reduced ejection fraction) (HCC) Atherosclerosis of native coronary artery of native heart without angina pectoris History of coronary angioplasty with insertion of stent Ischemic cardiomyopathy Denies anginal chest pain or heart failure symptoms. Refusing EKG today. Clinically appears in normal sinus rhythm. No use of sublingual nitroglycerin tablets since the last office visit. Medications were adjusted given her recent hospitalization for orthostatic hypotension/syncope. Will restart Entresto at a lower dose at 24/26 mg p.o. twice daily, she used to be on 49/51 mg p.o. twice daily Follow-up labs in 1 week after starting Entresto Outside records from Care Everywhere-recent ED visit from November 2024 reviewed.  Pure hypercholesterolemia Currently on Repatha. Continue Vascepa 1 g 1 tab twice daily. Continue Lipitor 80 mg p.o. nightly. Reemphasized the importance of lipid management with a goal LDL <55 mg/dL.  Essential hypertension Office blood pressures are well-controlled. Currently on metoprolol 25 mg p.o. daily. Will start Entresto as discussed above. Asked her to monitor her blood pressures at home.  Cigarette smoker Tobacco cessation counseling: Currently smoking 1 packs/day   She is willing to quit at this time.  Patient states that she is going into rehab for smoking cessation. 5 mins were spent counseling patient cessation techniques. We discussed various methods to help quit smoking, including deciding on a date to quit, joining a support group, pharmacological agents- nicotine gum/patch/lozenges.  I will reassess her progress at the next follow-up visit  Paroxysmal atrial fibrillation (HCC) Long term (current) use of anticoagulants Rate control: Metoprolol. Rhythm control: N/A. Thromboembolic prophylaxis: Coumadin Does  not endorse evidence of bleeding. She does have a history of GI  bleed and has been evaluated by GI in the past.   H/O St. Jude metal mechanical valve 2002 Need for prophylactic antibiotic Recommend a goal INR between 2.5-3.5 given her history of mechanical mitral valve replacement as well as atrial fibrillation. Follows with Coumadin clinic. Mechanical click auscultated on physical examination. Is aware of needing antibiotic prophylaxis prior to procedure.    Orders Placed:  Orders Placed This Encounter  Procedures   Basic metabolic panel with GFR    Standing Status:   Future    Number of Occurrences:   1    Expected Date:   06/07/2023    Expiration Date:   05/30/2024   ECHOCARDIOGRAM COMPLETE    Standing Status:   Future    Expected Date:   12/01/2023    Expiration Date:   05/30/2024    Where should this test be performed:   [facility])    Does the patient weigh less than or greater than 250 lbs?:   Patient weighs less than 250 lbs    Perflutren DEFINITY (image enhancing agent) should be administered unless hypersensitivity or allergy exist:   Administer Perflutren    Reason for exam-Echo:   Other-Full Diagnosis List    Full ICD-10/Reason for Exam:   H/O mitral valve replacement [715102]    Full ICD-10/Reason for Exam:   HFrEF (heart failure with reduced ejection fraction) (HCC) [1610960]   Final Medication List:    Meds ordered this encounter  Medications   sacubitril-valsartan (ENTRESTO) 24-26 MG    Sig: Take 1 tablet by mouth 2 (two) times daily. Hold if systolic blood pressure (top number) is less than 100    Dispense:  180 tablet    Refill:  3    There are no discontinued medications.    Current Outpatient Medications:    Accu-Chek FastClix Lancets MISC, Apply topically daily., Disp: , Rfl:    ACCU-CHEK GUIDE test strip, daily. as directed, Disp: , Rfl:    albuterol (VENTOLIN HFA) 108 (90 Base) MCG/ACT inhaler, Inhale 1 puff into the lungs every 6 (six) hours as needed for wheezing or shortness of breath.,  Disp: , Rfl:    atorvastatin (LIPITOR) 80 MG tablet, Take 1 tablet (80 mg total) by mouth daily., Disp: 90 tablet, Rfl: 2   busPIRone (BUSPAR) 10 MG tablet, Take 1 tablet by mouth in the morning and at bedtime., Disp: , Rfl:    clopidogrel (PLAVIX) 75 MG tablet, Take 1 tablet (75 mg total) by mouth every morning., Disp: 90 tablet, Rfl: 2   Diclofenac Sodium 3 % GEL, Apply topically., Disp: , Rfl:    DULoxetine (CYMBALTA) 30 MG capsule, Take 60 mg by mouth daily., Disp: , Rfl:    enoxaparin (LOVENOX) 100 MG/ML injection, Inject 0.9 mLs (90 mg total) into the skin every 12 (twelve) hours., Disp: 13 mL, Rfl: 1   Evolocumab (REPATHA SURECLICK) 140 MG/ML SOAJ, Inject 140 mg into the skin every 14 (fourteen) days., Disp: 2 mL, Rfl: 2   gabapentin (NEURONTIN) 600 MG tablet, Take 1 capsule by mouth 3 (three) times daily. , Disp: , Rfl:    magnesium oxide (MAG-OX) 400 MG tablet, Take 1 tablet by mouth daily at 12 noon., Disp: , Rfl:    metoprolol succinate (TOPROL-XL) 25 MG 24 hr tablet, Take 1 tablet (25 mg total) by mouth daily., Disp: 90 tablet, Rfl: 2   naloxone (NARCAN) nasal  spray 4 mg/0.1 mL, SMARTSIG:1 Spray(s) Both Nares 1 to 2 Times Daily, Disp: , Rfl:    omeprazole (PRILOSEC) 40 MG capsule, Take 1 capsule by mouth daily., Disp: , Rfl:    oxyCODONE-acetaminophen (PERCOCET) 10-325 MG tablet, Take by mouth. Take 1 tablet by mouth in the morning and 1 tablet at noon and 1 tablet in the evening and 1 tablet before bedtime, Disp: , Rfl:    prazosin (MINIPRESS) 1 MG capsule, Take 1 mg by mouth at bedtime., Disp: , Rfl:    QUEtiapine (SEROQUEL) 300 MG tablet, Take by mouth at bedtime. 1 and 1/2 tablet daily, Disp: , Rfl:    sacubitril-valsartan (ENTRESTO) 24-26 MG, Take 1 tablet by mouth 2 (two) times daily. Hold if systolic blood pressure (top number) is less than 100, Disp: 180 tablet, Rfl: 3   Semaglutide,0.25 or 0.5MG /DOS, (OZEMPIC, 0.25 OR 0.5 MG/DOSE,) 2 MG/1.5ML SOPN, Inject 0.5 mg into the skin  once a week., Disp: , Rfl:    VASCEPA 1 g capsule, Take 1 capsule (1 g total) by mouth 2 (two) times daily., Disp: 120 capsule, Rfl: 2   warfarin (COUMADIN) 5 MG tablet, Take 1/2 a tablet to 1 tablet by mouth daily as directed by the coumadin clinic, Disp: 30 tablet, Rfl: 0  Consent:      NA  Disposition:   6 months sooner if needed.  Her questions and concerns were addressed to her satisfaction. She voices understanding of the recommendations provided during this encounter.    Signed, Tessa Lerner, DO, [facility]  [facility]  [address] [place], [place] [address] [phone] 05/31/2023 11:05 AM

## 2023-06-11 ENCOUNTER — Other Ambulatory Visit: Payer: Self-pay | Admitting: *Deleted

## 2023-06-11 ENCOUNTER — Ambulatory Visit: Payer: MEDICAID | Attending: Cardiology | Admitting: *Deleted

## 2023-06-11 DIAGNOSIS — Z5181 Encounter for therapeutic drug level monitoring: Secondary | ICD-10-CM | POA: Diagnosis not present

## 2023-06-11 DIAGNOSIS — I480 Paroxysmal atrial fibrillation: Secondary | ICD-10-CM | POA: Diagnosis not present

## 2023-06-11 DIAGNOSIS — Z7901 Long term (current) use of anticoagulants: Secondary | ICD-10-CM | POA: Diagnosis not present

## 2023-06-11 DIAGNOSIS — Z952 Presence of prosthetic heart valve: Secondary | ICD-10-CM | POA: Diagnosis not present

## 2023-06-11 LAB — BASIC METABOLIC PANEL WITH GFR
BUN/Creatinine Ratio: 11 — ABNORMAL LOW (ref 12–28)
BUN: 12 mg/dL (ref 8–27)
CO2: 23 mmol/L (ref 20–29)
Calcium: 9.2 mg/dL (ref 8.7–10.3)
Chloride: 100 mmol/L (ref 96–106)
Creatinine, Ser: 1.06 mg/dL — ABNORMAL HIGH (ref 0.57–1.00)
Glucose: 123 mg/dL — ABNORMAL HIGH (ref 70–99)
Potassium: 4.4 mmol/L (ref 3.5–5.2)
Sodium: 139 mmol/L (ref 134–144)
eGFR: 59 mL/min/{1.73_m2} — ABNORMAL LOW (ref >59)

## 2023-06-11 LAB — POCT INR: INR: 2.1 (ref 2.0–3.0)

## 2023-06-11 MED ORDER — WARFARIN SODIUM 5 MG PO TABS: ORAL_TABLET | ORAL | 2 refills | Status: DC

## 2023-06-11 NOTE — Patient Instructions (Addendum)
 Description   Please use warfarin 5mg  tabs Today take 1.5 tablets of warfarin then continue taking warfarin 1/2 tablet daily EXCEPT 1 tablet on Sunday, Tuesday and Thursday.  Recheck INR in 4 weeks (usually 6 weeks).  Report to ER with any bleeding, falls, accidents.  Please call coumadin clinic at [phone].  Please be consistent with your Vitamin K intake, 1 servings per week.      New Address: [address] [place] [address]

## 2023-06-11 NOTE — Telephone Encounter (Signed)
 Warfarin 5mg  refill Afib, H/O St. Jude metal mechanical valve Last INR 06/11/23 Last OV 05/31/23

## 2023-07-09 ENCOUNTER — Ambulatory Visit: Payer: MEDICAID | Attending: Cardiology

## 2023-07-09 ENCOUNTER — Telehealth: Payer: Self-pay | Admitting: Cardiology

## 2023-07-09 DIAGNOSIS — Z952 Presence of prosthetic heart valve: Secondary | ICD-10-CM | POA: Insufficient documentation

## 2023-07-09 DIAGNOSIS — Z5181 Encounter for therapeutic drug level monitoring: Secondary | ICD-10-CM | POA: Diagnosis present

## 2023-07-09 DIAGNOSIS — I480 Paroxysmal atrial fibrillation: Secondary | ICD-10-CM | POA: Diagnosis present

## 2023-07-09 DIAGNOSIS — Z7901 Long term (current) use of anticoagulants: Secondary | ICD-10-CM | POA: Insufficient documentation

## 2023-07-09 LAB — POCT INR: INR: 3 (ref 2.0–3.0)

## 2023-07-09 NOTE — Patient Instructions (Signed)
 Description   Please use warfarin 5mg  tabs Continue taking warfarin 1/2 tablet daily EXCEPT 1 tablet on Sundays, Tuesdays and Thursdays.  Recheck INR in 5 weeks (usually 6 weeks).  Report to ER with any bleeding, falls, accidents.  Please call coumadin  clinic at [phone].  Please be consistent with your Vitamin K intake, 1 servings per week.

## 2023-07-09 NOTE — Telephone Encounter (Signed)
 Note placed on today's appt pt is running late.  Will work pt back into today's schedule.

## 2023-07-09 NOTE — Telephone Encounter (Signed)
 Patient went to wrong location for today's INR check and noted she has not been checked in a month and will need to know what dosage she should be taking.  Patient noted she is also having nose bleeds.

## 2023-07-09 NOTE — Telephone Encounter (Signed)
 Addition:  Patient stated she will still keep her appointment and is now on her way.

## 2023-08-02 ENCOUNTER — Other Ambulatory Visit: Payer: Self-pay | Admitting: Cardiology

## 2023-08-02 DIAGNOSIS — I480 Paroxysmal atrial fibrillation: Secondary | ICD-10-CM

## 2023-08-09 ENCOUNTER — Telehealth: Payer: Self-pay | Admitting: Cardiology

## 2023-08-09 NOTE — Telephone Encounter (Signed)
 I spoke to patient who started Doxycycline, so recommended she takes 0.5 tablet Sunday, before her visit 6/10.

## 2023-08-09 NOTE — Telephone Encounter (Signed)
 Pt c/o medication issue:  1. Name of Medication:  Voxycycline  2. How are you currently taking this medication (dosage and times per day)?  1 tablet by mouth twice daily for 7 days  3. Are you having a reaction (difficulty breathing--STAT)?   4. What is your medication issue?    Patient stated she was seen at ED yesterday and she was prescribed an antibiotic and she started taking it yesterday evening.  Patient wants to confirm it is OK to take this medication.

## 2023-08-13 ENCOUNTER — Ambulatory Visit: Payer: MEDICAID | Attending: Cardiology

## 2023-08-13 DIAGNOSIS — Z7901 Long term (current) use of anticoagulants: Secondary | ICD-10-CM | POA: Diagnosis present

## 2023-08-13 DIAGNOSIS — Z5181 Encounter for therapeutic drug level monitoring: Secondary | ICD-10-CM | POA: Diagnosis present

## 2023-08-13 DIAGNOSIS — I480 Paroxysmal atrial fibrillation: Secondary | ICD-10-CM | POA: Diagnosis present

## 2023-08-13 DIAGNOSIS — Z952 Presence of prosthetic heart valve: Secondary | ICD-10-CM | POA: Diagnosis present

## 2023-08-13 LAB — POCT INR: INR: 2.2 (ref 2.0–3.0)

## 2023-08-13 NOTE — Patient Instructions (Signed)
 Please use warfarin 5mg  tabs Take 1.5 tablets tonight only then Continue taking warfarin 1/2 tablet daily EXCEPT 1 tablet on Sundays, Tuesdays and Thursdays.  Recheck INR in 4 weeks (usually 6 weeks).  Report to ER with any bleeding, falls, accidents.  Please call coumadin  clinic at [phone].  Please be consistent with your Vitamin K intake, 1 servings per week.

## 2023-08-16 ENCOUNTER — Telehealth: Payer: Self-pay | Admitting: Cardiology

## 2023-08-16 NOTE — Telephone Encounter (Signed)
 Pt c/o medication issue:  1. Name of Medication: Azithromycin 250 mg   2. How are you currently taking this medication (dosage and times per day)?   3. Are you having a reaction (difficulty breathing--STAT)?   4. What is your medication issue? Pt called in stating she has been prescribed another antibiotic and asked if this is safe to take. Please advise.

## 2023-08-16 NOTE — Telephone Encounter (Signed)
 I spoke to patient and scheduled her INR check 6/19, once completed 5 day course of Azithromycin.  She verbalized understanding.

## 2023-08-16 NOTE — Telephone Encounter (Signed)
 Ok for her to take, but may want to check INR at end of therapy (assuming it is a 5 day course)

## 2023-08-22 ENCOUNTER — Ambulatory Visit: Payer: MEDICAID | Attending: Cardiology

## 2023-08-22 DIAGNOSIS — Z7901 Long term (current) use of anticoagulants: Secondary | ICD-10-CM | POA: Diagnosis present

## 2023-08-22 DIAGNOSIS — I480 Paroxysmal atrial fibrillation: Secondary | ICD-10-CM

## 2023-08-22 DIAGNOSIS — Z952 Presence of prosthetic heart valve: Secondary | ICD-10-CM

## 2023-08-22 LAB — POCT INR: INR: 3.2 — AB (ref 2.0–3.0)

## 2023-08-22 NOTE — Patient Instructions (Signed)
 Description   Please use warfarin 5mg  tabs Continue taking warfarin 1/2 tablet daily EXCEPT 1 tablet on Sundays, Tuesdays and Thursdays.   Recheck INR in 4 weeks (usually 6 weeks).  Report to ER with any bleeding, falls, accidents.  Please call coumadin  clinic at [phone].  Please be consistent with your Vitamin K intake, 1 servings per week.

## 2023-08-22 NOTE — Progress Notes (Signed)
SEE ANTI COAGULATION ENCOUNTER

## 2023-09-10 ENCOUNTER — Ambulatory Visit: Payer: MEDICAID

## 2023-09-19 ENCOUNTER — Ambulatory Visit: Payer: MEDICAID | Attending: Cardiology | Admitting: *Deleted

## 2023-09-19 DIAGNOSIS — Z952 Presence of prosthetic heart valve: Secondary | ICD-10-CM | POA: Insufficient documentation

## 2023-09-19 DIAGNOSIS — Z7901 Long term (current) use of anticoagulants: Secondary | ICD-10-CM | POA: Diagnosis present

## 2023-09-19 DIAGNOSIS — I480 Paroxysmal atrial fibrillation: Secondary | ICD-10-CM | POA: Insufficient documentation

## 2023-09-19 DIAGNOSIS — Z5181 Encounter for therapeutic drug level monitoring: Secondary | ICD-10-CM | POA: Insufficient documentation

## 2023-09-19 LAB — POCT INR: POC INR: 2

## 2023-09-19 NOTE — Progress Notes (Signed)
Please see anticoagulation encounter.

## 2023-09-19 NOTE — Patient Instructions (Signed)
 Description   Please use warfarin 5mg  tabs Take 1.5 tablets of warfarin today then Continue taking warfarin 1/2 tablet daily EXCEPT 1 tablet on Sundays, Tuesdays and Thursdays.   Recheck INR in 3 weeks. Please call coumadin  clinic at [phone] for any changes in medication or up coming procedures.

## 2023-10-10 ENCOUNTER — Ambulatory Visit: Payer: MEDICAID | Attending: Cardiology

## 2023-10-10 DIAGNOSIS — I480 Paroxysmal atrial fibrillation: Secondary | ICD-10-CM | POA: Diagnosis present

## 2023-10-10 DIAGNOSIS — Z7901 Long term (current) use of anticoagulants: Secondary | ICD-10-CM | POA: Insufficient documentation

## 2023-10-10 DIAGNOSIS — Z952 Presence of prosthetic heart valve: Secondary | ICD-10-CM | POA: Diagnosis present

## 2023-10-10 LAB — POCT INR: INR: 4.1 — AB (ref 2.0–3.0)

## 2023-10-10 NOTE — Progress Notes (Signed)
 INR 4.1; Please see anticoagulation encounter

## 2023-10-10 NOTE — Patient Instructions (Signed)
 Description   Please use warfarin 5mg  tabs HOLD today's dose and then Continue taking warfarin 1/2 tablet daily EXCEPT 1 tablet on Sundays, Tuesdays and Thursdays.   Recheck INR in 2 weeks. Please call coumadin  clinic at [phone] for any changes in medication or up coming procedures.

## 2023-10-24 ENCOUNTER — Ambulatory Visit: Payer: MEDICAID | Attending: Cardiology

## 2023-10-24 DIAGNOSIS — I480 Paroxysmal atrial fibrillation: Secondary | ICD-10-CM | POA: Insufficient documentation

## 2023-10-24 DIAGNOSIS — Z952 Presence of prosthetic heart valve: Secondary | ICD-10-CM | POA: Insufficient documentation

## 2023-10-24 DIAGNOSIS — Z7901 Long term (current) use of anticoagulants: Secondary | ICD-10-CM | POA: Insufficient documentation

## 2023-10-24 LAB — POCT INR: INR: 2.8 (ref 2.0–3.0)

## 2023-10-24 NOTE — Progress Notes (Signed)
 INR-2.8 Please see anticoagulation encounter

## 2023-10-24 NOTE — Patient Instructions (Signed)
 Description   Please use warfarin 5mg  tabs Continue taking warfarin 1/2 tablet daily EXCEPT 1 tablet on Sundays, Tuesdays and Thursdays.   Recheck INR in 3 weeks.  Please call coumadin  clinic at [phone] for any changes in medication or up coming procedures.

## 2023-11-06 ENCOUNTER — Other Ambulatory Visit: Payer: Self-pay | Admitting: Cardiology

## 2023-11-14 ENCOUNTER — Ambulatory Visit: Payer: MEDICAID

## 2023-11-25 ENCOUNTER — Ambulatory Visit (HOSPITAL_COMMUNITY)
Admission: RE | Admit: 2023-11-25 | Discharge: 2023-11-25 | Disposition: A | Discharge status: Home / Self Care | Payer: MEDICAID | Source: Ambulatory Visit | Attending: Cardiology | Admitting: Cardiology

## 2023-11-25 DIAGNOSIS — Z952 Presence of prosthetic heart valve: Secondary | ICD-10-CM | POA: Diagnosis present

## 2023-11-25 DIAGNOSIS — I5022 Chronic systolic (congestive) heart failure: Secondary | ICD-10-CM | POA: Insufficient documentation

## 2023-11-25 LAB — ECHOCARDIOGRAM COMPLETE
Area-P 1/2: 3.01 cm2
MV VTI: 1.83 cm2
S' Lateral: 4 cm

## 2023-11-26 ENCOUNTER — Ambulatory Visit: Payer: Self-pay | Admitting: Cardiology

## 2023-11-27 NOTE — Progress Notes (Signed)
 Patient never viewed in mychart. Printed, highlighted doctors comments and mailed to patient

## 2023-11-29 ENCOUNTER — Ambulatory Visit: Payer: MEDICAID | Attending: Cardiology | Admitting: Cardiology

## 2023-11-29 ENCOUNTER — Encounter: Payer: Self-pay | Admitting: Cardiology

## 2023-11-29 ENCOUNTER — Telehealth (HOSPITAL_BASED_OUTPATIENT_CLINIC_OR_DEPARTMENT_OTHER): Payer: Self-pay | Admitting: *Deleted

## 2023-11-29 ENCOUNTER — Telehealth: Payer: Self-pay

## 2023-11-29 ENCOUNTER — Ambulatory Visit: Payer: MEDICAID | Admitting: Pharmacist

## 2023-11-29 VITALS — BP 131/77 | HR 89 | Ht 60.0 in | Wt 186.0 lb

## 2023-11-29 DIAGNOSIS — Z5181 Encounter for therapeutic drug level monitoring: Secondary | ICD-10-CM | POA: Insufficient documentation

## 2023-11-29 DIAGNOSIS — Z7901 Long term (current) use of anticoagulants: Secondary | ICD-10-CM

## 2023-11-29 DIAGNOSIS — I2510 Atherosclerotic heart disease of native coronary artery without angina pectoris: Secondary | ICD-10-CM | POA: Diagnosis present

## 2023-11-29 DIAGNOSIS — E7800 Pure hypercholesterolemia, unspecified: Secondary | ICD-10-CM | POA: Insufficient documentation

## 2023-11-29 DIAGNOSIS — I255 Ischemic cardiomyopathy: Secondary | ICD-10-CM | POA: Insufficient documentation

## 2023-11-29 DIAGNOSIS — F17210 Nicotine dependence, cigarettes, uncomplicated: Secondary | ICD-10-CM | POA: Insufficient documentation

## 2023-11-29 DIAGNOSIS — Z01810 Encounter for preprocedural cardiovascular examination: Secondary | ICD-10-CM | POA: Diagnosis present

## 2023-11-29 DIAGNOSIS — Z952 Presence of prosthetic heart valve: Secondary | ICD-10-CM

## 2023-11-29 DIAGNOSIS — I480 Paroxysmal atrial fibrillation: Secondary | ICD-10-CM | POA: Diagnosis present

## 2023-11-29 DIAGNOSIS — I5022 Chronic systolic (congestive) heart failure: Secondary | ICD-10-CM | POA: Diagnosis present

## 2023-11-29 DIAGNOSIS — I10 Essential (primary) hypertension: Secondary | ICD-10-CM | POA: Diagnosis present

## 2023-11-29 DIAGNOSIS — Z955 Presence of coronary angioplasty implant and graft: Secondary | ICD-10-CM | POA: Diagnosis present

## 2023-11-29 LAB — POCT INR: INR: 2.1 (ref 2.0–3.0)

## 2023-11-29 MED ORDER — VASCEPA 1 G PO CAPS
1.0000 g | ORAL_CAPSULE | Freq: Two times a day (BID) | ORAL | 2 refills | Status: DC
Start: 2023-11-29 — End: 2023-11-29

## 2023-11-29 MED ORDER — ASPIRIN 81 MG PO TBEC: 81.0000 mg | DELAYED_RELEASE_TABLET | Freq: Every day | ORAL | 3 refills | Status: DC

## 2023-11-29 MED ORDER — VASCEPA 1 G PO CAPS: 1.0000 g | ORAL_CAPSULE | Freq: Two times a day (BID) | ORAL | 2 refills | Status: AC

## 2023-11-29 MED ORDER — SACUBITRIL-VALSARTAN 24-26 MG PO TABS: 1.0000 | ORAL_TABLET | Freq: Two times a day (BID) | ORAL | 3 refills | Status: DC

## 2023-11-29 MED ORDER — TORSEMIDE 10 MG PO TABS: 10.0000 mg | ORAL_TABLET | Freq: Every day | ORAL | 11 refills | Status: DC

## 2023-11-29 MED ORDER — ASPIRIN 81 MG PO TBEC: 81.0000 mg | DELAYED_RELEASE_TABLET | Freq: Every day | ORAL | 3 refills | Status: AC

## 2023-11-29 MED ORDER — TORSEMIDE 10 MG PO TABS: 10.0000 mg | ORAL_TABLET | Freq: Every day | ORAL | 11 refills | Status: AC

## 2023-11-29 MED ORDER — VASCEPA 1 G PO CAPS: 1.0000 g | ORAL_CAPSULE | Freq: Two times a day (BID) | ORAL | 2 refills | Status: DC

## 2023-11-29 MED ORDER — SACUBITRIL-VALSARTAN 24-26 MG PO TABS
1.0000 | ORAL_TABLET | Freq: Two times a day (BID) | ORAL | 3 refills | Status: DC
Start: 2023-11-29 — End: 2023-11-29

## 2023-11-29 NOTE — Patient Instructions (Addendum)
 Description   INR 2.1. Take an extra half tablet today and then continue  taking warfarin 1/2 tablet daily EXCEPT 1 tablet on Sundays, Tuesdays and Thursdays.   Recheck INR in 3 weeks.  Please call coumadin  clinic at [phone] for any changes in medication or up coming procedures.  Please use warfarin 5mg  tablets

## 2023-11-29 NOTE — Telephone Encounter (Signed)
 Caller Lessie) returned RN's call.

## 2023-11-29 NOTE — Progress Notes (Signed)
 Cardiology Office Note:  .   Date:  11/29/2023  ID:  Joanna Martinez, DOB [birth year], MRN [id number] PCP:  Voncile Wenona SAILOR, MD  Former Cardiology Providers: Dr. Ryan [phone] [facility] Providers Cardiologist:  Madonna Large, DO , [facility] (established care 07/02/2019) Electrophysiologist:  None  Click to update primary MD,subspecialty MD or APP then REFRESH:1}    History of Present Illness: .   Joanna Martinez is a 63 y.o. 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   Patient is being followed by the practice for management of CAD status post PCI to the LCx, mechanical mitral valve replacement in March 2002 at [facility], and paroxysmal atrial fibrillation.  Patient presents today for 5-month follow-up visit.    During last office visit in March 2025 patient was overall doing well from a cardiovascular standpoint.  We restarted her Entresto  at 24/26 mg p.o. twice daily and follow-up labs on/10/2023 noted stable renal function.  Of note, in the past used to be on Entresto  49/51 mg twice daily.  She was also smoking approximately 5 cigarettes/day and was educated on the importance of complete smoking cessation.  She did not endorse evidence of bleeding and was being followed by the Coumadin  clinic given her PAF and mechanical mitral valve.  She presents today for follow-up.  Patient denies anginal chest pain or heart failure symptoms. She has ran out of  multiple GDMT medications and requesting refills. Overall functional capacity remains relatively stable. Continues to smoke cigarettes. Patient requesting preoperative risk stratification for dermatological procedures at [facility].  But she does not know what procedure she is having done and if they need further recommendations with regards to anticoagulation given the type of the procedure.  She is also requesting the office to reach out to her gastroenterologist Dr. Lonni Martinez at [phone].   Review of Systems: .   Review of Systems  Constitutional: Positive for malaise/fatigue.  Cardiovascular:  Negative for chest pain, claudication, dyspnea on exertion, irregular heartbeat, leg swelling, near-syncope, orthopnea, palpitations, paroxysmal nocturnal dyspnea and syncope.  Respiratory:  Negative for cough, shortness of breath and sputum production.   Hematologic/Lymphatic: Negative for bleeding problem.  Musculoskeletal:  Negative for muscle cramps and myalgias.  Neurological:  Negative for dizziness and light-headedness.    Studies Reviewed:   EKG: Patient refused.   Echocardiogram: 08/30/2021: LVEF 35-40%, moderate global hypokinesis, LAE severely dilated. S/p St Jude mechanical mitral valve. Well seated valve. No significant prosthetic valve stenosis. Mean PG 3 mmHg, MVA 2.1 cm2 by PHT method.   Trace mitral regurgitation.   11/25/2023 1. Left ventricular ejection fraction, by estimation, is 40 to 45%. The left ventricle has mildly decreased function. The left ventricle demonstrates global hypokinesis. There is mild left ventricular hypertrophy. Left ventricular diastolic function could not be evaluated. Elevated left atrial pressure. The E/e' is 23. 2. Right ventricular systolic function is mildly reduced. The right ventricular size is normal. Mildly increased right ventricular wall thickness. There is normal pulmonary artery systolic pressure. The estimated right ventricular  systolic pressure is  35.3 mmHg. 3. Left atrial size was  severely dilated. 4. S/p St Jude mechanical mitral valve. Well seated, no valvular stenosis or regurgitation. 5. Tricuspid valve regurgitation is mild to moderate. 6. The aortic valve was not well visualized. Aortic valve regurgitation is not visualized. No aortic stenosis is present. 7. The inferior vena cava is normal in size with greater than 50% respiratory variability, suggesting right atrial pressure of 3 mmHg.  Comparison(s): Prior images unable to be directly viewed, comparison made by report only. 08/30/2021 reported LVEF 35-40%.    Heart Catheterization: November 2019 per report: Proximal LAD 20%. LCx stent patent.  Discrete 20% narrowing noted distal to the stent.  A separate 30% stenosis was noted in the distal LCx. RCA: Diffuse 20-30% narrowing in the proximal/mid/distal segments. Posterior atrioventricular branch 50 to 60% stenosis in its distal segment prior to the origin of the most distal posterolateral branch. Right PDA smooth tubular lesions with 60 to 70% luminal narrowing.     Lower Extremity Venous Duplex Left leg 06/03/2020: No evidence of deep vein thrombosis of the left lower extremity with normal venous return.    Risk Assessment/Calculations:    CHA2DS2-VASc Score = 5   This indicates a 7.2% annual risk of stroke. The patient's score is based upon: CHF History: 1 HTN History: 1 Diabetes History: 0 Stroke History: 2 Vascular Disease History: 0 Age Score: 0 Gender Score: 1             Labs:       Latest Ref Rng & Units 05/14/2022    2:14 PM 10/05/2021    9:13 AM 09/01/2021    9:18 AM  CBC  WBC 4.0 - 10.5 K/uL 6.4     Hemoglobin 12.0 - 15.0 g/dL 88.1  86.5  87.3   Hematocrit 36.0 - 46.0 % 36.4  40.7  38.2   Platelets 150 - 400 K/uL 360          Latest Ref Rng & Units 06/11/2023    8:55 AM 07/02/2022    8:50 AM 05/14/2022    2:14 PM  BMP  Glucose 70 - 99 mg/dL 876  880  890   BUN 8 - 27  mg/dL 12  26  14    Creatinine 0.57 - 1.00 mg/dL 8.93  8.26  8.73   BUN/Creat Ratio 12 - 28 11  15     Sodium 134 - 144 mmol/L 139  139  140   Potassium 3.5 - 5.2 mmol/L 4.4  4.5  4.2   Chloride 96 - 106 mmol/L 100  101  108   CO2 20 - 29 mmol/L 23  18  25    Calcium  8.7 - 10.3 mg/dL 9.2  9.2  8.5       Latest Ref Rng & Units 06/11/2023    8:55 AM 07/02/2022    8:50 AM 05/14/2022    2:14 PM  CMP  Glucose 70 - 99 mg/dL 876  880  890   BUN 8 - 27 mg/dL 12  26  14    Creatinine 0.57 - 1.00 mg/dL 8.93  8.26  8.73   Sodium 134 - 144 mmol/L 139  139  140   Potassium 3.5 - 5.2 mmol/L 4.4  4.5  4.2   Chloride 96 - 106 mmol/L 100  101  108   CO2 20 - 29 mmol/L 23  18  25    Calcium  8.7 - 10.3 mg/dL 9.2  9.2  8.5     Lab Results  Component Value Date   CHOL 165  11/20/2021   HDL 44 11/20/2021   LDLCALC 96 11/20/2021   LDLDIRECT 97 11/20/2021   TRIG 143 11/20/2021   No results for input(s): LIPOA in the last 8760 hours. No components found for: NTPROBNP No results for input(s): PROBNP in the last 8760 hours.  No results for input(s): TSH in the last 8760 hours.   Physical Exam:    Today's Vitals   11/29/23 0854  BP: 131/77  Pulse: 89  SpO2: 94%  Weight: 186 lb (84.4 kg)  Height: 5' (1.524 m)   Body mass index is 36.33 kg/m. Wt Readings from Last 3 Encounters:  11/29/23 186 lb (84.4 kg)  05/31/23 186 lb (84.4 kg)  12/04/22 190 lb 6.4 oz (86.4 kg)    Physical Exam  Constitutional: No distress.  Age appropriate, hemodynamically stable.   HENT:  Arcus senilis  Neck: No JVD present.  Cardiovascular: Normal rate, regular rhythm, S1 normal, S2 normal, intact distal pulses and normal pulses. Exam reveals no gallop, no S3 and no S4.  No murmur heard. Mechanical click  Pulmonary/Chest: Effort normal and breath sounds normal. No stridor. She has no wheezes. She has no rales.  Abdominal: Soft. Bowel sounds are normal. She exhibits no distension. There is no abdominal  tenderness.  Musculoskeletal:        General: No edema.     Cervical back: Neck supple.  Neurological: She is alert and oriented to person, place, and time. She has intact cranial nerves (2-12).  Skin: Skin is warm and moist.   Impression & Recommendation(s):  Impression:   ICD-10-CM   1. Chronic HFrEF (heart failure with reduced ejection fraction) (HCC)  I50.22 EKG 12-Lead    sacubitril -valsartan  (ENTRESTO ) 24-26 MG    VASCEPA  1 g capsule    DISCONTINUED: sacubitril -valsartan  (ENTRESTO ) 24-26 MG    DISCONTINUED: VASCEPA  1 g capsule    DISCONTINUED: sacubitril -valsartan  (ENTRESTO ) 24-26 MG    DISCONTINUED: VASCEPA  1 g capsule    2. Atherosclerosis of native coronary artery of native heart without angina pectoris  I25.10 EKG 12-Lead    VASCEPA  1 g capsule    DISCONTINUED: VASCEPA  1 g capsule    DISCONTINUED: VASCEPA  1 g capsule    3. History of coronary angioplasty with insertion of stent  Z95.5 EKG 12-Lead    VASCEPA  1 g capsule    DISCONTINUED: VASCEPA  1 g capsule    DISCONTINUED: VASCEPA  1 g capsule    4. Ischemic cardiomyopathy  I25.5 EKG 12-Lead    VASCEPA  1 g capsule    DISCONTINUED: VASCEPA  1 g capsule    DISCONTINUED: VASCEPA  1 g capsule    5. Long term (current) use of anticoagulants  Z79.01 EKG 12-Lead    6. Paroxysmal atrial fibrillation (HCC)  I48.0 EKG 12-Lead    7. H/O St. Jude metal mechanical valve 2002  Z95.2 EKG 12-Lead    8. Pure hypercholesterolemia  E78.00 EKG 12-Lead    9. Essential hypertension  I10 EKG 12-Lead    10. Cigarette smoker  F17.210 EKG 12-Lead     Recommendation(s):  Chronic HFrEF (heart failure with reduced ejection fraction) (HCC) Atherosclerosis of native coronary artery of native heart without angina pectoris History of coronary angioplasty with insertion of stent Ischemic cardiomyopathy Denies anginal chest pain or heart failure symptoms. Refusing EKG today. No use of sublingual nitroglycerin tablets since the last office  visit. Echocardiogram results from 11/25/2023 reviewed, slight improvement in LVEF from 35-40% to 40-45%.  See report for additional details Will refill Vascepa   1 g p.o. twice daily Refill Entresto  24/26 mg p.o. twice daily Refill torsemide  10 mg p.o. daily Continue Toprol -XL 25 mg p.o. daily To minimize risk of bleeding we will transition her from Plavix  to aspirin  81 mg p.o. daily  Pure hypercholesterolemia Currently on Repatha . Refill Vascepa  1 g 1 tab twice daily. Continue Lipitor 80 mg p.o. nightly. Reemphasized the importance of lipid management with a goal LDL <55 mg/dL.  Essential hypertension Office blood pressures are well-controlled. See medications above Asked her to monitor her blood pressures at home.  Paroxysmal atrial fibrillation (HCC) Long term (current) use of anticoagulants Rate control: Metoprolol . Rhythm control: N/A. Thromboembolic prophylaxis: Coumadin  Does not endorse evidence of bleeding. She does have a history of GI bleed and has been evaluated by GI in the past. Endorses compliance with Coumadin  Risks, benefits, alternatives to anticoagulation discussed.  H/O St. Jude metal mechanical valve 2002 Need for prophylactic antibiotic Recommend a goal INR between 2.5-3.5 given her history of mechanical mitral valve replacement as well as atrial fibrillation. Follows with Coumadin  clinic. Mechanical click auscultated on physical examination. Is aware of needing antibiotic prophylaxis prior to procedure.  Preprocedure risk stratification Patient plans to undergo 2 dermatological procedures at [facility]. She provides me forms which are without any details - no mention of what procedure is planned. Will have to call them for more details.  She is also wondering if she can have her GI procedure with Dr. Lonni Martinez at [phone]. I have asked her to arrange the procedure and I can help with risk stratification and anticoagulation management. Given her  mechanical valve and Afib she will need to be bridged. She verbalized understanding and will let us  know if questions or concerns arise.   Orders Placed:  Orders Placed This Encounter  Procedures   EKG 12-Lead   Final Medication List:      Current Outpatient Medications:    Accu-Chek FastClix Lancets MISC, Apply topically daily., Disp: , Rfl:    ACCU-CHEK GUIDE test strip, daily. as directed, Disp: , Rfl:    albuterol (VENTOLIN HFA) 108 (90 Base) MCG/ACT inhaler, Inhale 1 puff into the lungs every 6 (six) hours as needed for wheezing or shortness of breath., Disp: , Rfl:    aspirin  EC 81 MG tablet, Take 1 tablet (81 mg total) by mouth daily. Swallow whole., Disp: 90 tablet, Rfl: 3   atorvastatin  (LIPITOR) 80 MG tablet, Take 1 tablet (80 mg total) by mouth daily., Disp: 90 tablet, Rfl: 2   busPIRone (BUSPAR) 10 MG tablet, Take 1 tablet by mouth in the morning and at bedtime., Disp: , Rfl:    Diclofenac Sodium 3 % GEL, Apply topically., Disp: , Rfl:    DULoxetine (CYMBALTA) 30 MG capsule, Take 60 mg by mouth daily., Disp: , Rfl:    enoxaparin  (LOVENOX ) 100 MG/ML injection, Inject 0.9 mLs (90 mg total) into the skin every 12 (twelve) hours., Disp: 13 mL, Rfl: 1   Evolocumab  (REPATHA  SURECLICK) 140 MG/ML SOAJ, Inject 140 mg into the skin every 14 (fourteen) days., Disp: 2 mL, Rfl: 2   gabapentin (NEURONTIN) 600 MG tablet, Take 1 capsule by mouth 3 (three) times daily. , Disp: , Rfl:    magnesium oxide (MAG-OX) 400 MG tablet, Take 1 tablet by mouth daily at 12 noon., Disp: , Rfl:    metoprolol  succinate (TOPROL -XL) 25 MG 24 hr tablet, Take 1 tablet (25 mg total) by mouth daily., Disp: 90 tablet, Rfl: 2   naloxone (NARCAN) nasal spray  4 mg/0.1 mL, SMARTSIG:1 Spray(s) Both Nares 1 to 2 Times Daily, Disp: , Rfl:    omeprazole (PRILOSEC) 40 MG capsule, Take 1 capsule by mouth daily., Disp: , Rfl:    oxyCODONE-acetaminophen (PERCOCET) 10-325 MG tablet, Take by mouth. Take 1 tablet by mouth in the  morning and 1 tablet at noon and 1 tablet in the evening and 1 tablet before bedtime, Disp: , Rfl:    prazosin (MINIPRESS) 1 MG capsule, Take 1 mg by mouth at bedtime., Disp: , Rfl:    QUEtiapine (SEROQUEL) 300 MG tablet, Take by mouth at bedtime. 1 and 1/2 tablet daily, Disp: , Rfl:    Semaglutide ,0.25 or 0.5MG /DOS, (OZEMPIC , 0.25 OR 0.5 MG/DOSE,) 2 MG/1.5ML SOPN, Inject 0.5 mg into the skin once a week., Disp: , Rfl:    warfarin (COUMADIN ) 5 MG tablet, TAKE 1/2 TO 1 TABLET BY MOUTH ONCE DAILY AS DIRECTED BY THE ANTICOAGULATION CLINIC, Disp: 30 tablet, Rfl: 11   sacubitril -valsartan  (ENTRESTO ) 24-26 MG, Take 1 tablet by mouth 2 (two) times daily. Hold if systolic blood pressure (top number) is less than 100, Disp: 180 tablet, Rfl: 3   torsemide  (DEMADEX ) 10 MG tablet, Take 1 tablet (10 mg total) by mouth daily., Disp: 30 tablet, Rfl: 11   VASCEPA  1 g capsule, Take 1 capsule (1 g total) by mouth 2 (two) times daily., Disp: 120 capsule, Rfl: 2  Consent:      NA  Disposition:   6 months sooner if needed.  Her questions and concerns were addressed to her satisfaction. She voices understanding of the recommendations provided during this encounter.    Signed, Madonna Michele HAS, [facility]  A Division of [facility] [address] [place], [place] [address]  11/29/2023 11:22 AM

## 2023-11-29 NOTE — Telephone Encounter (Signed)
 Spoke with Arland, Dr. Marten nurse. She states that patient has not been seen in their office since October of 2024, after endoscopy/ultrasound was done. She does see notes that a procedure might be planned for spring/summer of this year but notes do not specify what procedure. Arland states she will check and let us  know next week. She gives her direct # as [phone].

## 2023-11-29 NOTE — Patient Instructions (Signed)
 Medication Instructions:  Please STOP taking plavix .   Please START taking 81 mg aspirin  daily. This is an over-the-counter dose.  *If you need a refill on your cardiac medications before your next appointment, please call your pharmacy*  Lab Work: None.  If you have labs (blood work) drawn today and your tests are completely normal, you will receive your results only by: MyChart Message (if you have MyChart) OR A paper copy in the mail If you have any lab test that is abnormal or we need to change your treatment, we will call you to review the results.  Testing/Procedures: None.  Follow-Up: At [facility], you and your health needs are our priority.  As part of our continuing mission to provide you with exceptional heart care, our providers are all part of one team.  This team includes your primary Cardiologist (physician) and Advanced Practice Providers or APPs (Physician Assistants and Nurse Practitioners) who all work together to provide you with the care you need, when you need it.  Your next appointment:   6 month(s)  Provider:   Madonna Large, DO    We recommend signing up for the patient portal called MyChart.  Sign up information is provided on this After Visit Summary.  MyChart is used to connect with patients for Virtual Visits (Telemedicine).  Patients are able to view lab/test results, encounter notes, upcoming appointments, etc.  Non-urgent messages can be sent to your provider as well.   To learn more about what you can do with MyChart, go to [email or web address].

## 2023-11-29 NOTE — Telephone Encounter (Signed)
-----   Message from Lum LITTIE Louis sent at 11/29/2023 11:22 AM EDT ----- Saw patient for INR check. She says she has a procedure at [facility] dermatology [place] office on 10/16. Can you see if they sent a clearance?  Do not see one

## 2023-11-29 NOTE — Progress Notes (Signed)
 Description   INR 2.1. Take an extra half tablet today and then continue  taking warfarin 1/2 tablet daily EXCEPT 1 tablet on Sundays, Tuesdays and Thursdays.   Recheck INR in 3 weeks.  Please call coumadin  clinic at [phone] for any changes in medication or up coming procedures.  Please use warfarin 5mg  tablets

## 2023-11-29 NOTE — Telephone Encounter (Signed)
 I called [facility] and requested that we will need a clearance request faxed to our office [phone] attn: preop team

## 2023-11-29 NOTE — Telephone Encounter (Signed)
 Spoke with Sela at Dr. Jonel office (GI MD) at [phone]. Requested a call back from Dr. Jonel RN to discuss pre-op clearance and coumadin /lovenox  bridge,need info on what procedure is planned and when procedure is planned.   Sherita also advises that provider line for provider to provider consultation is is [phone].

## 2023-12-02 ENCOUNTER — Other Ambulatory Visit (HOSPITAL_COMMUNITY): Payer: MEDICAID

## 2023-12-02 NOTE — Telephone Encounter (Signed)
 [facility] called this morning. I stated the we will need a clearance request to be faxed to [phone] attn: preop team. They will get the fax to us  today. The planned procedure is for 12/10/23 per requesting office.

## 2023-12-02 NOTE — Telephone Encounter (Signed)
 Arland from Dr Walden returning call please Advise

## 2023-12-02 NOTE — Telephone Encounter (Signed)
 Call from [facility] at Dr. Jonel gens. She states that Dr. Missie would like to have patient do an Endoscopy with ultrasound and fine needle biopsy. For this procedure,  Dr. Missie states patient would need to hold coumadin  and bridge to IV heparin, Dr. Missie recommends this be done at [facility] as inpatient. Dr. Missie plans to work with hospitalist to admit patient and that plan is in process though there is no admit date yet. Arland states she may know more next Monday.

## 2023-12-02 NOTE — Telephone Encounter (Signed)
 Spoke with Turkey and Alfonso at [facility] dermatology. They advised patient has 2 appointments for which she needs cardiac clearance. She has an appt 12/10/23 and 02/04/24, both for incision and drainage of the scalp. They were unable to tell me what disease process/cyst type was present, nor were they able to tell me size of the cyst/affected area. Asked for all notes on this condition to be faxed to [phone], explained that if patient needs clearance by 12/10/23 we need some information to get this clearance turned around as patient has a mechanical valve.

## 2023-12-02 NOTE — Telephone Encounter (Signed)
 Great, at least we have a plan.  Agree with the plan, bridging to heparin given her mechanical mitral valve. Patient is considered to be acceptable risk for upcoming endoscopy no additional workup warranted. She is on Ozempic  which may need to be held prior to the procedure more for anesthesia precautions, will defer recommendations to prescribing provider. Keeping our PharmD pool in loop as well.   PharmD Pool Currently on Coumadin . GI plans to bridge her to IV heparin for upcoming endoscopy given her mechanical mitral valve.  Please see her in close follow-up to make sure her INR's are well-controlled and back to goal postprocedure.  Dr. Leviathan Macera

## 2023-12-02 NOTE — Telephone Encounter (Signed)
Attempted to call back. No answer and no voicemail. 

## 2023-12-03 NOTE — Telephone Encounter (Signed)
 I s/w [facility] that we still have not yet received the clearance request. Nurse states oh yes they will get that to us  today. Fax# given again [phone] attn preop team

## 2023-12-03 NOTE — Telephone Encounter (Signed)
 I have not yet seen a fax come over yet from [facility].

## 2023-12-03 NOTE — Telephone Encounter (Signed)
 Dr. Marnee returned Dr. Tyree call.

## 2023-12-04 ENCOUNTER — Other Ambulatory Visit: Payer: Self-pay | Admitting: Cardiology

## 2023-12-04 NOTE — Telephone Encounter (Signed)
 Joanna Martinez,  What is good number and time to call Dr.James Szabo.  Let arrange it today.   [name] Osceola, DO, FACC

## 2023-12-04 NOTE — Telephone Encounter (Signed)
 I will forward this to preop APP to see notes from Dr. Michele and any further recommendations if needed.

## 2023-12-04 NOTE — Telephone Encounter (Signed)
 Spoke to Dr. Marnee regarding patient care.  Patient is considered to be acceptable risk for upcoming incision and drainage.  However, given her mechanical mitral valve and atrial fibrillation wanted to discuss anticoagulation management to avoid confusion.  Given her mechanical mitral valve and A-fib recommend bridging.  Option #1: Bridge with Lovenox  and Coumadin  discussed with Dr. Marnee.  And for safety would recommend close follow-up postprocedure with him to monitor for bleeding and healing.  However, Dr. Marnee recommends holding anticoagulation 5 days before procedure and 5 days after.   Option #2: My concern is that the proposed 10 days of no anticoagulation places Joanna Martinez at high risk for mechanical valve thrombosis.  If patient truly needs to be off of anticoagulation 5 days before and 5 days after the procedure would recommend inpatient bridging with IV heparin.  Dr. Marnee will discuss perioperative anticoagulation management and the scope of the proposed procedure with the patient.   If they choose to proceed with Lovenox  bridging prior to her procedure they will call us  back.    We will wait to hear back from them.  Joanna Fedie Hawthorne, DO, FACC

## 2023-12-04 NOTE — Telephone Encounter (Signed)
 I will call [facility] to ask for a good conatct # for Dr. Marnee per the request of Dr. Michele.   I was told if he could call back tomorrow as Dr. Marnee is with pt's today. I did state Dr. Michele is as well. I just asked for a # and stated will Dr. Michele make his decision when he wants to call Dr. Marnee. Dr. Marnee is in their [place] location today and will be leaving by 12 noon. Dr. Michele can call today [phone] and ask for Dr. Marnee or Miss Lonell who can get Dr. Marnee on the line for Dr. Michele.   If not today then can call back tomorrow at # [phone] and ask to s/w Dr. Marnee.

## 2023-12-04 NOTE — Telephone Encounter (Signed)
 Dr. Michele has communicated directly with the requesting office. I will forward to our pharmD so they are included in the plans for bridging.   I will remove from preop pool.

## 2023-12-05 ENCOUNTER — Other Ambulatory Visit: Payer: Self-pay | Admitting: Cardiology

## 2023-12-06 NOTE — Telephone Encounter (Signed)
 Just FYI:  From Dr. Michele 12/02/2023. Great, at least we have a plan.  Agree with the plan, bridging to heparin given her mechanical mitral valve. Patient is considered to be acceptable risk for upcoming endoscopy no additional workup warranted. She is on Ozempic  which may need to be held prior to the procedure more for anesthesia precautions, will defer recommendations to prescribing provider. Keeping our PharmD pool in loop as well.    PharmD Pool Currently on Coumadin . GI plans to bridge her to IV heparin for upcoming endoscopy given her mechanical mitral valve.  Please see her in close follow-up to make sure her INR's are well-controlled and back to goal postprocedure.   Dr. Tolia

## 2023-12-06 NOTE — Telephone Encounter (Signed)
 I spoke to patient over the phone and updated her that since her office visit we have reached out to both her GI provider office and dermatology provider to coordinate care. They will call us  back based on when the procedures are planned.   Patient is optimized from cardiac standpoint and acceptable risk for both I&D w/ dermatology and EGD w/ GI providers. Still need to discuss anticoagulation plan based on when and where these procedures will be done.   Joanna Martinez was thankful for closing the loop and updating her regarding her care.   Laker Thompson [place], DO, FACC

## 2023-12-10 ENCOUNTER — Telehealth: Payer: Self-pay | Admitting: Cardiology

## 2023-12-10 NOTE — Telephone Encounter (Signed)
 Pt called in to clarify which medication should be taking, and she would like a c/b from the nurse please advise

## 2023-12-10 NOTE — Telephone Encounter (Signed)
 Per pt pharmacy just called and clarified Entresto  24-26 BID has been sent to [facility] in [place]. Made patient aware to check blood pressure and hold medication if systolic blood pressure is less that 100. Per pt she will pick medication up tomorrow. Pt verbalized understanding.

## 2023-12-13 ENCOUNTER — Telehealth: Payer: Self-pay | Admitting: *Deleted

## 2023-12-13 NOTE — Telephone Encounter (Signed)
 Patient called to report she took a whole tablet of warfarin today instead of the half tablet she was due. She states she just got mixed up with days and has been taking it correctly. Advised that since she took an extra half tablet more on today then she will not need to take tomorrow's 1/2 tablet of warfarin and she verbalized understanding.

## 2023-12-17 ENCOUNTER — Encounter: Payer: Self-pay | Admitting: Cardiology

## 2023-12-17 NOTE — Telephone Encounter (Signed)
 Error

## 2023-12-20 ENCOUNTER — Ambulatory Visit: Payer: MEDICAID | Attending: Cardiology | Admitting: Pharmacist

## 2023-12-20 DIAGNOSIS — Z5181 Encounter for therapeutic drug level monitoring: Secondary | ICD-10-CM | POA: Diagnosis present

## 2023-12-20 DIAGNOSIS — Z7901 Long term (current) use of anticoagulants: Secondary | ICD-10-CM

## 2023-12-20 DIAGNOSIS — I480 Paroxysmal atrial fibrillation: Secondary | ICD-10-CM

## 2023-12-20 DIAGNOSIS — Z952 Presence of prosthetic heart valve: Secondary | ICD-10-CM

## 2023-12-20 LAB — POCT INR: INR: 3.2 — AB (ref 2.0–3.0)

## 2023-12-20 NOTE — Progress Notes (Signed)
 Description   INR 3.2:  Continue  taking warfarin 1/2 tablet daily EXCEPT 1 tablet on Sundays, Tuesdays and Thursdays.   Recheck INR in 2 weeks (pt request) Please call coumadin  clinic at [phone] for any changes in medication or up coming procedures.  Please use warfarin 5mg  tablets

## 2023-12-20 NOTE — Patient Instructions (Addendum)
 Description   INR 3.2:  Continue  taking warfarin 1/2 tablet daily EXCEPT 1 tablet on Sundays, Tuesdays and Thursdays.   Recheck INR in 2 weeks (pt request) Please call coumadin  clinic at [phone] for any changes in medication or up coming procedures.  Please use warfarin 5mg  tablets

## 2023-12-31 ENCOUNTER — Ambulatory Visit: Payer: MEDICAID | Attending: Cardiology | Admitting: *Deleted

## 2023-12-31 DIAGNOSIS — I480 Paroxysmal atrial fibrillation: Secondary | ICD-10-CM | POA: Diagnosis present

## 2023-12-31 DIAGNOSIS — Z7901 Long term (current) use of anticoagulants: Secondary | ICD-10-CM | POA: Insufficient documentation

## 2023-12-31 DIAGNOSIS — Z952 Presence of prosthetic heart valve: Secondary | ICD-10-CM | POA: Diagnosis present

## 2023-12-31 DIAGNOSIS — Z5181 Encounter for therapeutic drug level monitoring: Secondary | ICD-10-CM | POA: Diagnosis present

## 2023-12-31 LAB — POCT INR: INR: 2.9 (ref 2.0–3.0)

## 2023-12-31 NOTE — Patient Instructions (Signed)
 Description   INR-2.9; Continue taking warfarin 1/2 tablet daily EXCEPT 1 tablet on Sundays, Tuesdays and Thursdays.   Recheck INR in 2 weeks (pt request) Please call coumadin  clinic at [phone] for any changes in medication or up coming procedures.  Please use warfarin 5mg  tablets

## 2023-12-31 NOTE — Progress Notes (Signed)
 Description   INR-2.9; Continue taking warfarin 1/2 tablet daily EXCEPT 1 tablet on Sundays, Tuesdays and Thursdays.   Recheck INR in 2 weeks (pt request) Please call coumadin  clinic at [phone] for any changes in medication or up coming procedures.  Please use warfarin 5mg  tablets

## 2024-01-03 ENCOUNTER — Ambulatory Visit: Payer: MEDICAID

## 2024-01-09 ENCOUNTER — Telehealth: Payer: Self-pay | Admitting: Gastroenterology

## 2024-01-09 ENCOUNTER — Telehealth: Payer: Self-pay | Admitting: Cardiology

## 2024-01-09 NOTE — Telephone Encounter (Signed)
 Did she want me to call her back?  Or this is just FYI.   Dr. Bethsaida Siegenthaler

## 2024-01-09 NOTE — Telephone Encounter (Signed)
 Good afternoon Dr. 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 Patient was last seen with [facility] in October 2024. States previous provider is no longer there and received our information from a current patient of ours. Patient is currently in the process of getting all records from [facility] sent over for you to review and advise on scheduling further.   Thank you.

## 2024-01-09 NOTE — Telephone Encounter (Signed)
 Patient wants a call back to discuss her getting a biopsy of a growth on her pancreas.

## 2024-01-09 NOTE — Telephone Encounter (Signed)
 Called and spoke to pt regarding the biopsy of pancreas. She states that she has had this problem for over a year. Cardiology (Dr. Michele) advises to be admitted for the procedure and the GI provider had advised doing this as an outpatient. She states that now, both teams are in agreement and that she should be admitted for it. She states the GI team will be performing the biopsy. Advised her to get back in touch with us  once the GI team gives her a day/time for procedure. Pt verbalized understanding.

## 2024-01-10 ENCOUNTER — Telehealth: Payer: Self-pay | Admitting: Cardiology

## 2024-01-10 NOTE — Telephone Encounter (Signed)
 Pt aware to call the surgeon's office to discuss this.  Patient verbalized understanding and agreeable to plan.

## 2024-01-10 NOTE — Telephone Encounter (Addendum)
 Request received to transfer GI care from outside practice to [facility].  She has had recent upper EUS earlier this year with Dr. Missie, head of [facility].  There was plan for a repeat upper EUS but coordination for Coumadin  and heparin needed to be pursued (there was documentation back in September that they were trying to coordinate this).  Thus she has had a quaternary center evaluation.  She should ask her referring provider, to reach back out to Dr. Missie and [facility] time.  We appreciate the interest in our practice, however at this time due to high demand from patients without established GI providers we cannot accommodate this transfer.  Ability to accommodate future transfer requests may change over time and the patient can contact us  again in 12 months if still interested in being seen at [facility].

## 2024-01-10 NOTE — Telephone Encounter (Signed)
 Pt Pt would like to know if while she is in the hospital for a pancreas biopsy, could the yalso do biopsies on the lumps on her head. Please advise.

## 2024-01-14 ENCOUNTER — Ambulatory Visit: Payer: MEDICAID | Attending: Cardiology | Admitting: Pharmacist

## 2024-01-14 DIAGNOSIS — Z5181 Encounter for therapeutic drug level monitoring: Secondary | ICD-10-CM | POA: Insufficient documentation

## 2024-01-14 DIAGNOSIS — Z952 Presence of prosthetic heart valve: Secondary | ICD-10-CM | POA: Insufficient documentation

## 2024-01-14 DIAGNOSIS — I480 Paroxysmal atrial fibrillation: Secondary | ICD-10-CM | POA: Insufficient documentation

## 2024-01-14 DIAGNOSIS — Z7901 Long term (current) use of anticoagulants: Secondary | ICD-10-CM | POA: Diagnosis present

## 2024-01-14 LAB — POCT INR: INR: 2.2 (ref 2.0–3.0)

## 2024-01-14 NOTE — Progress Notes (Signed)
 Description   INR-2.2; Take 1 and 1/2 tablets today and then continue taking warfarin 1/2 tablet daily EXCEPT 1 tablet on Sundays, Tuesdays and Thursdays.   Recheck INR in 2 weeks (pt request) Please call coumadin  clinic at [phone] for any changes in medication or up coming procedures.  Please use warfarin 5mg  tablets     Patient still waiting for surgery. Has been losing weight due to lack of appetite. Advised to call GI office.

## 2024-01-14 NOTE — Patient Instructions (Signed)
 Description   INR-2.2; Take 1 and 1/2 tablets today and then continue taking warfarin 1/2 tablet daily EXCEPT 1 tablet on Sundays, Tuesdays and Thursdays.   Recheck INR in 2 weeks (pt request) Please call coumadin  clinic at [phone] for any changes in medication or up coming procedures.  Please use warfarin 5mg  tablets

## 2024-01-23 NOTE — Telephone Encounter (Signed)
 Patient has been made aware and is currently continuing her care with Dr. Missie.

## 2024-01-28 ENCOUNTER — Ambulatory Visit: Payer: MEDICAID | Attending: Cardiology

## 2024-01-28 DIAGNOSIS — Z5181 Encounter for therapeutic drug level monitoring: Secondary | ICD-10-CM | POA: Insufficient documentation

## 2024-01-28 DIAGNOSIS — Z952 Presence of prosthetic heart valve: Secondary | ICD-10-CM | POA: Diagnosis not present

## 2024-01-28 DIAGNOSIS — Z7901 Long term (current) use of anticoagulants: Secondary | ICD-10-CM | POA: Insufficient documentation

## 2024-01-28 DIAGNOSIS — I480 Paroxysmal atrial fibrillation: Secondary | ICD-10-CM | POA: Diagnosis not present

## 2024-01-28 LAB — POCT INR: INR: 3.2 — AB (ref 2.0–3.0)

## 2024-01-28 NOTE — Patient Instructions (Signed)
 Description   INR-3.2; Continue taking warfarin 1/2 tablet daily EXCEPT 1 tablet on Sundays, Tuesdays and Thursdays.   Recheck INR in 3 weeks. Please call coumadin  clinic at [phone] for any changes in medication or up coming procedures.  Please use warfarin 5mg  tablets

## 2024-01-28 NOTE — Progress Notes (Signed)
 Description   INR-3.2; Continue taking warfarin 1/2 tablet daily EXCEPT 1 tablet on Sundays, Tuesdays and Thursdays.   Recheck INR in 3 weeks. Please call coumadin  clinic at [phone] for any changes in medication or up coming procedures.  Please use warfarin 5mg  tablets

## 2024-02-02 ENCOUNTER — Other Ambulatory Visit: Payer: Self-pay | Admitting: Cardiology

## 2024-02-02 DIAGNOSIS — I5022 Chronic systolic (congestive) heart failure: Secondary | ICD-10-CM

## 2024-02-12 ENCOUNTER — Telehealth: Payer: Self-pay

## 2024-02-12 DIAGNOSIS — I480 Paroxysmal atrial fibrillation: Secondary | ICD-10-CM

## 2024-02-12 DIAGNOSIS — Z952 Presence of prosthetic heart valve: Secondary | ICD-10-CM

## 2024-02-12 NOTE — Telephone Encounter (Signed)
 Dr Michele and I are familiar with patient. Plan was for patient to hold warfarin and be admitted with a heparin drip. Routing to Dr Michele so he is aware procedure now has a date

## 2024-02-12 NOTE — Telephone Encounter (Signed)
 I reviewed chart and was able to locate the following   From Dr. Michele 12/02/2023. Great, at least we have a plan.  Agree with the plan, bridging to heparin given her mechanical mitral valve. Patient is considered to be acceptable risk for upcoming endoscopy no additional workup warranted. She is on Ozempic  which may need to be held prior to the procedure more for anesthesia precautions, will defer recommendations to prescribing provider. Keeping our PharmD pool in loop as well.    PharmD Pool Currently on Coumadin . GI plans to bridge her to IV heparin for upcoming endoscopy given her mechanical mitral valve.  Please see her in close follow-up to make sure her INR's are well-controlled and back to goal postprocedure.   Dr. Michele Huge to pharm D pool   Joanna FABIENE Louder, PA-C 02/12/2024 2:22 PM

## 2024-02-12 NOTE — Telephone Encounter (Signed)
 Pt called states she is scheduled to have an upcoming procedure in [place] with [facility] MD Dr Lonni Phillips.  Procedure is scheduled for 02/20/24, pt states she is going to be admitted for procedure but unsure of admission date.  Pt states she received a letter with pre-procedure instructions and she is supposed to start holding Warfarin on 02/14/24 prior to procedure.  Pt has appt on 02/18/24 in Coumadin  Clinic she was wanting to know if she should keep.  However I do not see a formal clearance for procedure or anticoagulation hold in chart.  Forwarding message to pharmacy team and pre-op clearance team.  I see a note that Dr Michele and GI think she should be admitted for procedure, but no clearance.  Please address and advise pt how to proceed.  Pt states she has done Lovenox  in the past when holding Warfarin.

## 2024-02-13 NOTE — Telephone Encounter (Signed)
 [place],  Please coordinate care. If they wanted to hold Coumadin  and 02/14/2024 she will need to be on Lovenox  until she is hospitalized. Please look into this and if needed you can move her Coumadin  appointment up sooner to arrange care.  Thank you  Dr. Michele

## 2024-02-17 MED ORDER — ENOXAPARIN SODIUM 80 MG/0.8ML IJ SOSY: 80.0000 mg | PREFILLED_SYRINGE | Freq: Two times a day (BID) | INTRAMUSCULAR | 1 refills | Status: DC

## 2024-02-17 NOTE — Telephone Encounter (Signed)
 Pt called states she was supposed to start holding Warfarin yesterday, however since she had not heard back from our office she has not yet started holding her Warfarin. Pt took her dosage of Warfarin yesterday.  Message from Dr Michele on 02/13/24 to Chris Pavero, Pharmacist states pt needed sooner appt to be bridged with Lovenox  while holding Warfarin.  Pt's procedure is scheduled for 02/20/24, since pt has not began holding Warfarin doubt procedure is still going to be able to be done.  Per clearance note it appeared plan was for GI to admit pt prior to procedure for IV Heparin bridge, but this has not happened. Will consult with Chris Pavero, pharmacist about how to proceed, may need to contact GI and advise pt has not started Warfarin hold and procedure may need to be rescheduled.

## 2024-02-17 NOTE — Addendum Note (Signed)
 Addended by: DARRELL BRUCKNER on: 02/17/2024 09:35 AM   Modules accepted: Orders

## 2024-02-17 NOTE — Telephone Encounter (Signed)
 Consulted with Dr Michele. Will have patient hold warfarin and begin Lovenox  BID until admission.   Called and spoke with patient. Advised to d/c warfarin today and start Lovenox  injections tomorrow until admission. Patient to call clinic after discharge to set up next INR appt.

## 2024-02-18 ENCOUNTER — Ambulatory Visit: Payer: MEDICAID

## 2024-02-20 ENCOUNTER — Telehealth: Payer: Self-pay | Admitting: *Deleted

## 2024-02-20 NOTE — Telephone Encounter (Signed)
 Pt calling with concerns how she should take her warfarin. Pt is currently in the hospital at [facility] and is waiting to have a procedure. Informed pt that while she is in the hospital that the hospital will need to be the ones managing her warfarin and when she is out to give us  a call so that we can check her Inr and then give her instructions. Informed pt that if she has any questions concerning her care while she is in the hospital she will need to ask the nurse or Dr who is caring for her.

## 2024-02-24 ENCOUNTER — Ambulatory Visit: Payer: MEDICAID | Attending: Cardiology | Admitting: Pharmacist

## 2024-02-24 DIAGNOSIS — Z952 Presence of prosthetic heart valve: Secondary | ICD-10-CM | POA: Diagnosis not present

## 2024-02-24 DIAGNOSIS — Z5181 Encounter for therapeutic drug level monitoring: Secondary | ICD-10-CM | POA: Insufficient documentation

## 2024-02-24 DIAGNOSIS — Z7901 Long term (current) use of anticoagulants: Secondary | ICD-10-CM | POA: Diagnosis not present

## 2024-02-24 DIAGNOSIS — I480 Paroxysmal atrial fibrillation: Secondary | ICD-10-CM | POA: Diagnosis present

## 2024-02-24 LAB — POCT INR: INR: 1.4 — AB (ref 2.0–3.0)

## 2024-02-24 MED ORDER — ENOXAPARIN SODIUM 80 MG/0.8ML IJ SOSY: 80.0000 mg | PREFILLED_SYRINGE | Freq: Two times a day (BID) | INTRAMUSCULAR | 0 refills | Status: AC

## 2024-02-24 NOTE — Patient Instructions (Signed)
 Description   INR-1.4; Continue Lovenox  twice daily. Take 1 whole tablet today and 1.5 tablets tomorrow than continue taking warfarin 1/2 tablet daily EXCEPT 1 tablet on Sundays, Tuesdays and Thursdays.    Please call coumadin  clinic at [phone] for any changes in medication or up coming procedures.  Please use warfarin 5mg  tablets

## 2024-02-24 NOTE — Progress Notes (Signed)
 Description   INR-1.4; Continue Lovenox  twice daily. Take 1 whole tablet today and 1.5 tablets tomorrow than continue taking warfarin 1/2 tablet daily EXCEPT 1 tablet on Sundays, Tuesdays and Thursdays.    Please call coumadin  clinic at [phone] for any changes in medication or up coming procedures.  Please use warfarin 5mg  tablets

## 2024-02-28 ENCOUNTER — Ambulatory Visit: Payer: MEDICAID | Attending: Cardiology

## 2024-02-28 DIAGNOSIS — Z7901 Long term (current) use of anticoagulants: Secondary | ICD-10-CM | POA: Insufficient documentation

## 2024-02-28 DIAGNOSIS — I480 Paroxysmal atrial fibrillation: Secondary | ICD-10-CM | POA: Diagnosis present

## 2024-02-28 DIAGNOSIS — Z5181 Encounter for therapeutic drug level monitoring: Secondary | ICD-10-CM | POA: Diagnosis present

## 2024-02-28 DIAGNOSIS — Z952 Presence of prosthetic heart valve: Secondary | ICD-10-CM | POA: Diagnosis present

## 2024-02-28 LAB — POCT INR: INR: 1.4 — AB (ref 2.0–3.0)

## 2024-02-28 NOTE — Patient Instructions (Signed)
 Continue Lovenox  twice daily. Take 2 tablets today and 1.5 tablets Saturday then continue taking warfarin 1/2 tablet daily EXCEPT 1 tablet on Sundays, Tuesdays and Thursdays.    Please call coumadin  clinic at [phone] for any changes in medication or up coming procedures.  Please use warfarin 5mg  tablets

## 2024-02-28 NOTE — Progress Notes (Signed)
"   INR 1.4 Please see anticoagulation encounter Continue Lovenox  twice daily. Take 2 tablets today and 1.5 tablets Saturday then continue taking warfarin 1/2 tablet daily EXCEPT 1 tablet on Sundays, Tuesdays and Thursdays.    Please call coumadin  clinic at [phone] for any changes in medication or up coming procedures.  Please use warfarin 5mg  tablets  "

## 2024-03-02 ENCOUNTER — Ambulatory Visit: Payer: MEDICAID | Attending: Cardiology

## 2024-03-02 DIAGNOSIS — Z7901 Long term (current) use of anticoagulants: Secondary | ICD-10-CM | POA: Insufficient documentation

## 2024-03-02 DIAGNOSIS — I480 Paroxysmal atrial fibrillation: Secondary | ICD-10-CM | POA: Insufficient documentation

## 2024-03-02 DIAGNOSIS — Z952 Presence of prosthetic heart valve: Secondary | ICD-10-CM | POA: Diagnosis present

## 2024-03-02 DIAGNOSIS — Z5181 Encounter for therapeutic drug level monitoring: Secondary | ICD-10-CM | POA: Insufficient documentation

## 2024-03-02 LAB — POCT INR: INR: 3.1 — AB (ref 2.0–3.0)

## 2024-03-02 NOTE — Progress Notes (Signed)
"   INR 3.1 Please see anticoagulation encounter continue taking warfarin 1/2 tablet daily EXCEPT 1 tablet on Sundays, Tuesdays and Thursdays.  INR in 4 weeks  Please call coumadin  clinic at [phone] for any changes in medication or up coming procedures.  Please use warfarin 5mg  tablets "

## 2024-03-02 NOTE — Patient Instructions (Signed)
 continue taking warfarin 1/2 tablet daily EXCEPT 1 tablet on Sundays, Tuesdays and Thursdays.  INR in 4 weeks  Please call coumadin  clinic at [phone] for any changes in medication or up coming procedures.  Please use warfarin 5mg  tablets

## 2024-03-12 ENCOUNTER — Ambulatory Visit: Payer: MEDICAID | Attending: Cardiology | Admitting: Pharmacist

## 2024-03-12 ENCOUNTER — Ambulatory Visit: Payer: Self-pay | Admitting: Pharmacist

## 2024-03-12 DIAGNOSIS — Z5181 Encounter for therapeutic drug level monitoring: Secondary | ICD-10-CM | POA: Diagnosis present

## 2024-03-12 DIAGNOSIS — Z7901 Long term (current) use of anticoagulants: Secondary | ICD-10-CM | POA: Insufficient documentation

## 2024-03-12 DIAGNOSIS — I480 Paroxysmal atrial fibrillation: Secondary | ICD-10-CM | POA: Insufficient documentation

## 2024-03-12 DIAGNOSIS — Z952 Presence of prosthetic heart valve: Secondary | ICD-10-CM | POA: Insufficient documentation

## 2024-03-12 LAB — PROTIME-INR
INR: 7 (ref 0.9–1.2)
Prothrombin Time: 73.5 s — ABNORMAL HIGH (ref 9.1–12.0)

## 2024-03-12 LAB — POCT INR: INR: 7.5 — AB (ref 2.0–3.0)

## 2024-03-12 NOTE — Patient Instructions (Addendum)
 Description   Lab INR 7.0: Attempted to call patient. Unable to leave VM on cell phone, lmom on home number.    Please call coumadin  clinic at [phone] for any changes in medication or up coming procedures.  Please use warfarin 5mg  tablets

## 2024-03-12 NOTE — Progress Notes (Unsigned)
 Lab INR 7.0. Contacted patient, no answer on cell phone and VM box full. Lmom on home number.

## 2024-03-16 ENCOUNTER — Ambulatory Visit: Payer: MEDICAID | Attending: Cardiology

## 2024-03-16 DIAGNOSIS — Z952 Presence of prosthetic heart valve: Secondary | ICD-10-CM | POA: Insufficient documentation

## 2024-03-16 DIAGNOSIS — Z5181 Encounter for therapeutic drug level monitoring: Secondary | ICD-10-CM | POA: Insufficient documentation

## 2024-03-16 DIAGNOSIS — Z7901 Long term (current) use of anticoagulants: Secondary | ICD-10-CM | POA: Insufficient documentation

## 2024-03-16 DIAGNOSIS — I480 Paroxysmal atrial fibrillation: Secondary | ICD-10-CM | POA: Diagnosis not present

## 2024-03-16 LAB — POCT INR: INR: 2 (ref 2.0–3.0)

## 2024-03-16 NOTE — Patient Instructions (Signed)
 Description   INR 2.0,  Take 1 tablet today, then resume previous dosage regimen 1/2 tablet daily except 1 tablet on Sundays, Tuesdays and Thursdays.  Recheck in 2 weeks.  Please call coumadin  clinic at [phone] for any changes in medication or up coming procedures.  Please use warfarin 5mg  tablets

## 2024-03-16 NOTE — Progress Notes (Signed)
 Description   INR 2.0,  Take 1 tablet today, then resume previous dosage regimen 1/2 tablet daily except 1 tablet on Sundays, Tuesdays and Thursdays.  Recheck in 2 weeks.  Please call coumadin  clinic at [phone] for any changes in medication or up coming procedures.  Please use warfarin 5mg  tablets

## 2024-03-27 ENCOUNTER — Telehealth: Payer: Self-pay | Admitting: Cardiology

## 2024-03-30 ENCOUNTER — Ambulatory Visit: Payer: MEDICAID

## 2024-03-31 ENCOUNTER — Telehealth: Payer: Self-pay | Admitting: Cardiology

## 2024-03-31 NOTE — Telephone Encounter (Signed)
 Left message for patient informing her we did not see an upcoming procedure scheduled. If needing clearance to have provider performing procedure send clearance request via fax to [phone]. Provided office number for callback if any other questions.

## 2024-03-31 NOTE — Telephone Encounter (Signed)
 Patient calling to speak to the nurse about her upcoming procedure. Please advise

## 2024-04-05 ENCOUNTER — Telehealth: Payer: Self-pay | Admitting: *Deleted

## 2024-04-05 NOTE — Telephone Encounter (Signed)
 Left message for the patient to call back regarding the office opening at 10am tomorrow due to inclement weather and asked to call back if able to come later or need to reschedule.

## 2024-04-06 ENCOUNTER — Ambulatory Visit: Payer: MEDICAID | Admitting: *Deleted

## 2024-04-06 DIAGNOSIS — Z5181 Encounter for therapeutic drug level monitoring: Secondary | ICD-10-CM

## 2024-04-06 DIAGNOSIS — I480 Paroxysmal atrial fibrillation: Secondary | ICD-10-CM

## 2024-04-06 DIAGNOSIS — Z952 Presence of prosthetic heart valve: Secondary | ICD-10-CM | POA: Diagnosis not present

## 2024-04-06 DIAGNOSIS — Z7901 Long term (current) use of anticoagulants: Secondary | ICD-10-CM

## 2024-04-06 LAB — PROTIME-INR
INR: 5.3 (ref 0.9–1.2)
Prothrombin Time: 56.2 s — ABNORMAL HIGH (ref 9.1–12.0)

## 2024-04-06 LAB — POCT INR: INR: 6.1 — AB (ref 2.0–3.0)

## 2024-04-06 NOTE — Patient Instructions (Signed)
 Description   STAT INR 5.3  POC INR-6.1; STAT INR DRAWN Do not take any warfarin today and no warfarin tomorrow and then continue taking 1/2 tablet daily except 1 tablet on Sundays, Tuesdays and Thursdays. Recheck in 1 week.  Please call coumadin  clinic at [phone] for any changes in medication or up coming procedures.  Please use warfarin 5mg  tablets

## 2024-04-06 NOTE — Progress Notes (Signed)
 Description   STAT INR 5.3  POC INR-6.1; STAT INR DRAWN Do not take any warfarin today and no warfarin tomorrow and then continue taking 1/2 tablet daily except 1 tablet on Sundays, Tuesdays and Thursdays. Recheck in 1 week.  Please call coumadin  clinic at [phone] for any changes in medication or up coming procedures.  Please use warfarin 5mg  tablets

## 2024-04-13 ENCOUNTER — Ambulatory Visit: Payer: MEDICAID
